# Patient Record
Sex: Female | Born: 1950
Health system: Southern US, Community
[De-identification: ages and names within clinical notes are randomized; demographics above are authoritative.]

## PROBLEM LIST (undated history)

## (undated) DIAGNOSIS — R002 Palpitations: Secondary | ICD-10-CM

## (undated) DIAGNOSIS — I341 Nonrheumatic mitral (valve) prolapse: Secondary | ICD-10-CM

## (undated) DIAGNOSIS — Z87898 Personal history of other specified conditions: Secondary | ICD-10-CM

## (undated) HISTORY — DX: Nonrheumatic mitral (valve) prolapse: I34.1

## (undated) HISTORY — DX: Personal history of other specified conditions: Z87.898

## (undated) HISTORY — PX: OTHER SURGICAL HISTORY: SHX169

## (undated) HISTORY — DX: Palpitations: R00.2

---

## 1995-12-03 HISTORY — PX: CHOLECYSTECTOMY: SHX55

## 1998-03-02 ENCOUNTER — Encounter: Admission: RE | Admit: 1998-03-02 | Discharge: 1998-05-31 | Payer: Self-pay | Admitting: Orthopedic Surgery

## 1998-06-14 ENCOUNTER — Ambulatory Visit: Admission: RE | Admit: 1998-06-14 | Discharge: 1998-06-14 | Payer: Self-pay | Admitting: Family Medicine

## 1998-06-22 ENCOUNTER — Ambulatory Visit (HOSPITAL_BASED_OUTPATIENT_CLINIC_OR_DEPARTMENT_OTHER): Admission: RE | Admit: 1998-06-22 | Discharge: 1998-06-22 | Payer: Self-pay | Admitting: Orthopedic Surgery

## 1999-01-25 ENCOUNTER — Ambulatory Visit (HOSPITAL_COMMUNITY): Admission: RE | Admit: 1999-01-25 | Discharge: 1999-01-25 | Payer: Self-pay | Admitting: Family Medicine

## 1999-01-25 ENCOUNTER — Encounter: Payer: Self-pay | Admitting: Family Medicine

## 1999-06-25 ENCOUNTER — Other Ambulatory Visit: Admission: RE | Admit: 1999-06-25 | Discharge: 1999-06-25 | Payer: Self-pay | Admitting: Family Medicine

## 1999-10-18 ENCOUNTER — Encounter: Admission: RE | Admit: 1999-10-18 | Discharge: 1999-10-18 | Payer: Self-pay | Admitting: Family Medicine

## 1999-10-18 ENCOUNTER — Encounter: Payer: Self-pay | Admitting: Family Medicine

## 2000-01-28 ENCOUNTER — Encounter: Payer: Self-pay | Admitting: Family Medicine

## 2000-01-28 ENCOUNTER — Ambulatory Visit (HOSPITAL_COMMUNITY): Admission: RE | Admit: 2000-01-28 | Discharge: 2000-01-28 | Payer: Self-pay | Admitting: Family Medicine

## 2000-08-28 ENCOUNTER — Encounter: Payer: Self-pay | Admitting: Neurological Surgery

## 2000-08-28 ENCOUNTER — Ambulatory Visit (HOSPITAL_COMMUNITY): Admission: RE | Admit: 2000-08-28 | Discharge: 2000-08-28 | Payer: Self-pay | Admitting: Neurological Surgery

## 2000-09-04 ENCOUNTER — Encounter: Admission: RE | Admit: 2000-09-04 | Discharge: 2000-11-04 | Payer: Self-pay | Admitting: Neurological Surgery

## 2001-01-28 ENCOUNTER — Ambulatory Visit (HOSPITAL_COMMUNITY): Admission: RE | Admit: 2001-01-28 | Discharge: 2001-01-28 | Payer: Self-pay | Admitting: Family Medicine

## 2001-01-28 ENCOUNTER — Encounter: Payer: Self-pay | Admitting: Family Medicine

## 2001-10-05 ENCOUNTER — Encounter: Payer: Self-pay | Admitting: Family Medicine

## 2001-10-05 ENCOUNTER — Encounter: Admission: RE | Admit: 2001-10-05 | Discharge: 2001-10-05 | Payer: Self-pay | Admitting: Family Medicine

## 2001-12-09 ENCOUNTER — Encounter: Admission: RE | Admit: 2001-12-09 | Discharge: 2001-12-09 | Payer: Self-pay | Admitting: Family Medicine

## 2001-12-09 ENCOUNTER — Encounter: Payer: Self-pay | Admitting: Family Medicine

## 2002-03-05 ENCOUNTER — Ambulatory Visit (HOSPITAL_COMMUNITY): Admission: RE | Admit: 2002-03-05 | Discharge: 2002-03-05 | Payer: Self-pay | Admitting: Family Medicine

## 2002-03-05 ENCOUNTER — Encounter: Payer: Self-pay | Admitting: Family Medicine

## 2002-04-01 ENCOUNTER — Other Ambulatory Visit: Admission: RE | Admit: 2002-04-01 | Discharge: 2002-04-01 | Payer: Self-pay | Admitting: Family Medicine

## 2003-03-30 ENCOUNTER — Encounter: Payer: Self-pay | Admitting: Family Medicine

## 2003-03-30 ENCOUNTER — Ambulatory Visit (HOSPITAL_COMMUNITY): Admission: RE | Admit: 2003-03-30 | Discharge: 2003-03-30 | Payer: Self-pay | Admitting: Family Medicine

## 2003-05-04 ENCOUNTER — Other Ambulatory Visit: Admission: RE | Admit: 2003-05-04 | Discharge: 2003-05-04 | Payer: Self-pay | Admitting: Family Medicine

## 2003-11-18 ENCOUNTER — Encounter: Admission: RE | Admit: 2003-11-18 | Discharge: 2003-11-18 | Payer: Self-pay | Admitting: Family Medicine

## 2004-04-30 ENCOUNTER — Ambulatory Visit (HOSPITAL_COMMUNITY): Admission: RE | Admit: 2004-04-30 | Discharge: 2004-04-30 | Payer: Self-pay | Admitting: Family Medicine

## 2004-05-22 ENCOUNTER — Other Ambulatory Visit: Admission: RE | Admit: 2004-05-22 | Discharge: 2004-05-22 | Payer: Self-pay | Admitting: Family Medicine

## 2005-05-03 ENCOUNTER — Ambulatory Visit (HOSPITAL_COMMUNITY): Admission: RE | Admit: 2005-05-03 | Discharge: 2005-05-03 | Payer: Self-pay | Admitting: Family Medicine

## 2005-05-27 ENCOUNTER — Other Ambulatory Visit: Admission: RE | Admit: 2005-05-27 | Discharge: 2005-05-27 | Payer: Self-pay | Admitting: Family Medicine

## 2005-08-03 ENCOUNTER — Emergency Department (HOSPITAL_COMMUNITY): Admission: AD | Admit: 2005-08-03 | Discharge: 2005-08-03 | Payer: Self-pay | Admitting: Family Medicine

## 2006-05-14 ENCOUNTER — Ambulatory Visit (HOSPITAL_COMMUNITY): Admission: RE | Admit: 2006-05-14 | Discharge: 2006-05-14 | Payer: Self-pay | Admitting: General Surgery

## 2006-06-02 ENCOUNTER — Encounter: Admission: RE | Admit: 2006-06-02 | Discharge: 2006-06-02 | Payer: Self-pay | Admitting: General Surgery

## 2006-07-15 ENCOUNTER — Other Ambulatory Visit: Admission: RE | Admit: 2006-07-15 | Discharge: 2006-07-15 | Payer: Self-pay | Admitting: Family Medicine

## 2006-12-16 ENCOUNTER — Ambulatory Visit: Payer: Self-pay | Admitting: Family Medicine

## 2006-12-22 ENCOUNTER — Ambulatory Visit: Payer: Self-pay

## 2006-12-22 ENCOUNTER — Encounter: Payer: Self-pay | Admitting: Cardiology

## 2007-01-08 ENCOUNTER — Ambulatory Visit: Payer: Self-pay | Admitting: Cardiology

## 2007-01-28 ENCOUNTER — Encounter: Admission: RE | Admit: 2007-01-28 | Discharge: 2007-01-28 | Payer: Self-pay | Admitting: Occupational Medicine

## 2007-06-17 ENCOUNTER — Ambulatory Visit (HOSPITAL_COMMUNITY): Admission: RE | Admit: 2007-06-17 | Discharge: 2007-06-17 | Payer: Self-pay | Admitting: Family Medicine

## 2007-09-15 ENCOUNTER — Other Ambulatory Visit: Admission: RE | Admit: 2007-09-15 | Discharge: 2007-09-15 | Payer: Self-pay | Admitting: Family Medicine

## 2007-11-02 HISTORY — PX: COLONOSCOPY: SHX174

## 2007-11-18 ENCOUNTER — Ambulatory Visit (HOSPITAL_COMMUNITY): Admission: RE | Admit: 2007-11-18 | Discharge: 2007-11-18 | Payer: Self-pay | Admitting: Gastroenterology

## 2007-11-18 ENCOUNTER — Encounter (INDEPENDENT_AMBULATORY_CARE_PROVIDER_SITE_OTHER): Payer: Self-pay | Admitting: Gastroenterology

## 2008-04-10 ENCOUNTER — Emergency Department (HOSPITAL_COMMUNITY): Admission: EM | Admit: 2008-04-10 | Discharge: 2008-04-10 | Payer: Self-pay | Admitting: Family Medicine

## 2008-04-13 ENCOUNTER — Encounter: Payer: Self-pay | Admitting: Internal Medicine

## 2008-04-13 ENCOUNTER — Ambulatory Visit: Payer: Self-pay | Admitting: Internal Medicine

## 2008-04-27 ENCOUNTER — Ambulatory Visit: Payer: Self-pay | Admitting: Cardiology

## 2008-04-27 ENCOUNTER — Ambulatory Visit (HOSPITAL_COMMUNITY): Admission: RE | Admit: 2008-04-27 | Discharge: 2008-04-27 | Payer: Self-pay | Admitting: Cardiology

## 2008-07-28 ENCOUNTER — Ambulatory Visit: Payer: Self-pay | Admitting: Cardiology

## 2008-08-01 ENCOUNTER — Ambulatory Visit (HOSPITAL_COMMUNITY): Admission: RE | Admit: 2008-08-01 | Discharge: 2008-08-01 | Payer: Self-pay | Admitting: Family Medicine

## 2009-03-24 ENCOUNTER — Encounter (INDEPENDENT_AMBULATORY_CARE_PROVIDER_SITE_OTHER): Payer: Self-pay | Admitting: *Deleted

## 2009-04-27 ENCOUNTER — Encounter (INDEPENDENT_AMBULATORY_CARE_PROVIDER_SITE_OTHER): Payer: Self-pay | Admitting: *Deleted

## 2009-05-30 DIAGNOSIS — Z9189 Other specified personal risk factors, not elsewhere classified: Secondary | ICD-10-CM | POA: Insufficient documentation

## 2009-05-30 DIAGNOSIS — R002 Palpitations: Secondary | ICD-10-CM | POA: Insufficient documentation

## 2009-05-31 ENCOUNTER — Encounter: Payer: Self-pay | Admitting: Internal Medicine

## 2009-05-31 ENCOUNTER — Ambulatory Visit: Payer: Self-pay | Admitting: Internal Medicine

## 2009-05-31 ENCOUNTER — Other Ambulatory Visit: Admission: RE | Admit: 2009-05-31 | Discharge: 2009-05-31 | Payer: Self-pay | Admitting: Internal Medicine

## 2009-06-02 ENCOUNTER — Ambulatory Visit: Payer: Self-pay | Admitting: Internal Medicine

## 2009-06-06 ENCOUNTER — Encounter (INDEPENDENT_AMBULATORY_CARE_PROVIDER_SITE_OTHER): Payer: Self-pay | Admitting: *Deleted

## 2009-06-06 LAB — CONVERTED CEMR LAB
ALT: 14 units/L (ref 0–35)
AST: 20 units/L (ref 0–37)
Albumin: 4.3 g/dL (ref 3.5–5.2)
Alkaline Phosphatase: 62 units/L (ref 39–117)
Bilirubin Urine: NEGATIVE
Bilirubin, Direct: 0.2 mg/dL (ref 0.0–0.3)
Creatinine, Ser: 0.8 mg/dL (ref 0.4–1.2)
GFR calc non Af Amer: 78.82 mL/min (ref >60)
HDL: 67.2 mg/dL (ref >39.00)
Hemoglobin: 15.1 g/dL — ABNORMAL HIGH (ref 12.0–15.0)
Ketones, ur: NEGATIVE mg/dL
Lymphocytes Relative: 28.2 % (ref 12.0–46.0)
Lymphs Abs: 1.2 10*3/uL (ref 0.7–4.0)
Neutro Abs: 2.5 10*3/uL (ref 1.4–7.7)
Sodium: 142 meq/L (ref 135–145)
TSH: 2.14 microintl units/mL (ref 0.35–5.50)
Total CHOL/HDL Ratio: 3
Total Protein, Urine: NEGATIVE mg/dL
Triglycerides: 54 mg/dL (ref 0.0–149.0)
Urobilinogen, UA: 0.2 (ref 0.0–1.0)
VLDL: 10.8 mg/dL (ref 0.0–40.0)
WBC: 4.3 10*3/uL — ABNORMAL LOW (ref 4.5–10.5)
pH: 7.5 (ref 5.0–8.0)

## 2009-06-07 ENCOUNTER — Encounter (INDEPENDENT_AMBULATORY_CARE_PROVIDER_SITE_OTHER): Payer: Self-pay | Admitting: *Deleted

## 2009-08-03 ENCOUNTER — Ambulatory Visit (HOSPITAL_COMMUNITY): Admission: RE | Admit: 2009-08-03 | Discharge: 2009-08-03 | Payer: Self-pay | Admitting: Internal Medicine

## 2009-10-25 ENCOUNTER — Telehealth: Payer: Self-pay | Admitting: Internal Medicine

## 2009-10-30 ENCOUNTER — Ambulatory Visit: Payer: Self-pay | Admitting: Internal Medicine

## 2010-05-15 ENCOUNTER — Ambulatory Visit: Payer: Self-pay | Admitting: Cardiology

## 2010-08-07 ENCOUNTER — Encounter: Admission: RE | Admit: 2010-08-07 | Discharge: 2010-08-07 | Payer: Self-pay | Admitting: General Surgery

## 2010-10-09 ENCOUNTER — Ambulatory Visit: Payer: Self-pay | Admitting: Internal Medicine

## 2010-10-09 LAB — CONVERTED CEMR LAB
Albumin: 4.3 g/dL (ref 3.5–5.2)
Basophils Absolute: 0 10*3/uL (ref 0.0–0.1)
Basophils Relative: 0.5 % (ref 0.0–3.0)
Bilirubin Urine: NEGATIVE
Bilirubin, Direct: 0.1 mg/dL (ref 0.0–0.3)
Calcium: 9.5 mg/dL (ref 8.4–10.5)
Creatinine, Ser: 0.8 mg/dL (ref 0.4–1.2)
Direct LDL: 118.1 mg/dL
Glucose, Bld: 105 mg/dL — ABNORMAL HIGH (ref 70–99)
HDL: 77.4 mg/dL (ref >39.00)
Hemoglobin: 15.4 g/dL — ABNORMAL HIGH (ref 12.0–15.0)
Ketones, ur: NEGATIVE mg/dL
Lymphocytes Relative: 29.3 % (ref 12.0–46.0)
MCHC: 34.7 g/dL (ref 30.0–36.0)
Platelets: 192 10*3/uL (ref 150.0–400.0)
Specific Gravity, Urine: <=1.005 (ref 1.000–1.030)
Urine Glucose: NEGATIVE mg/dL
Urobilinogen, UA: 0.2 (ref 0.0–1.0)
VLDL: 16.4 mg/dL (ref 0.0–40.0)

## 2010-10-19 ENCOUNTER — Ambulatory Visit: Payer: Self-pay | Admitting: Internal Medicine

## 2010-10-19 ENCOUNTER — Encounter: Payer: Self-pay | Admitting: Internal Medicine

## 2010-10-19 ENCOUNTER — Other Ambulatory Visit: Admission: RE | Admit: 2010-10-19 | Discharge: 2010-10-19 | Payer: Self-pay | Admitting: Internal Medicine

## 2010-12-23 ENCOUNTER — Encounter: Payer: Self-pay | Admitting: General Surgery

## 2010-12-24 ENCOUNTER — Encounter: Payer: Self-pay | Admitting: Internal Medicine

## 2011-01-01 NOTE — Letter (Signed)
Summary: Medical Exam Form/Office of Education [facility]  Medical Exam Form/Office of Education Abroad   Imported By: Sherian Rein 11/01/2010 10:07:13  _____________________________________________________________________  External Attachment:    Type:   Image     Comment:   External Document

## 2011-01-01 NOTE — Assessment & Plan Note (Signed)
Summary: CPX/NWS  #   Vital Signs:  Patient profile:   60 year old female Height:      67 inches (167.64 cm) Weight:      175.4 pounds (79.73 kg) BMI:     27.57 O2 Sat:      97 % on Room air Temp:     98.3 degrees F (36.83 degrees C) oral Pulse rate:   60 / minute BP sitting:   110 / 82  (left arm) Cuff size:   regular  Vitals Entered By: Orlan Leavens RMA (October 19, 2010 10:52 AM)  O2 Flow:  Room air CC: CPX Is Patient Diabetic? No Pain Assessment Patient in pain? no      Comments also have form need fill-out for school   Primary Care Provider:  Newt Lukes MD  CC:  CPX.  History of Present Illness:   patient is here today for annual physical. Patient feels well and has no complaints.  needs completion of international travel abroad form needs PAP - no pelvic pain, postmeopausal bleeding - last period >10 yr ago  Preventive Screening-Counseling & Management  Alcohol-Tobacco     Alcohol drinks/day: <1     Alcohol Counseling: not indicated; use of alcohol is not excessive or problematic     Smoking Status: never     Tobacco Counseling: not indicated; no tobacco use  Caffeine-Diet-Exercise     Does Patient Exercise: yes     Times/week: 4     Exercise Counseling: not indicated; exercise is adequate  Safety-Violence-Falls     Seat Belt Counseling: not indicated; patient wears seat belts     Violence Counseling: not indicated; no violence risk noted     Fall Risk Counseling: not indicated; no significant falls noted  Clinical Review Panels:  Prevention   Last Mammogram:  ASSESSMENT: Negative - BI-RADS 1^MM DIGITAL SCREENING (08/03/2009)   Last Pap Smear:  NEGATIVE FOR INTRAEPITHELIAL LESIONS OR MALIGNANCY. (05/31/2009)   Last Colonoscopy:  normal (11/02/2007)  Immunizations   Last Flu Vaccine:  Historical given @ wrk (10/18/2010)  Lipid Management   Cholesterol:  209 (10/09/2010)   HDL (good cholesterol):  77.40 (10/09/2010)  CBC   WBC:  5.4  (10/09/2010)   RBC:  4.79 (10/09/2010)   Hgb:  15.4 (10/09/2010)   Hct:  44.4 (10/09/2010)   Platelets:  192.0 (10/09/2010)   MCV  92.7 (10/09/2010)   MCHC  34.7 (10/09/2010)   RDW  12.8 (10/09/2010)   PMN:  60.6 (10/09/2010)   Lymphs:  29.3 (10/09/2010)   Monos:  8.3 (10/09/2010)   Eosinophils:  1.3 (10/09/2010)   Basophil:  0.5 (10/09/2010)  Complete Metabolic Panel   Glucose:  105 (10/09/2010)   Sodium:  139 (10/09/2010)   Potassium:  5.3 (10/09/2010)   Chloride:  105 (10/09/2010)   CO2:  28 (10/09/2010)   BUN:  12 (10/09/2010)   Creatinine:  0.8 (10/09/2010)   Albumin:  4.3 (10/09/2010)   Total Protein:  6.9 (10/09/2010)   Calcium:  9.5 (10/09/2010)   Total Bili:  1.2 (10/09/2010)   Alk Phos:  61 (10/09/2010)   SGPT (ALT):  15 (10/09/2010)   SGOT (AST):  21 (10/09/2010)   Current Medications (verified): 1)  Toprol Xl 25 Mg Xr24h-Tab (Metoprolol Succinate) .... Take 1 Tablet By Mouth Once A Day 2)  Aspirin 81 Mg Tbec (Aspirin) .... Take  By Mouth Qd 3)  Calcium 500 Mg Tabs (Calcium Carbonate) .... Take 1 By Mouth Qd  Allergies (verified):  1)  ! Keflex 2)  ! Ampicillin 3)  ! Bactrim 4)  ! Floxin 5)  ! Amoxicillin 6)  ! Demerol  Past History:  Past medical, surgical, family and social histories (including risk factors) reviewed, and no changes noted (except as noted below).  Past Medical History: ?mitral valve prolapse- responded to beta-blockers,    no evidence of this on 2-d echo chest pain hx palpatations  Past Surgical History: Reviewed history from 05/10/2010 and no changes required. Cholecystectomy (1997) Left shoulder acrominoplasty  Family History: Reviewed history from 05/31/2009 and no changes required. Family History Diabetes 1st degree relative (parent) Family History High cholesterol (parent) Family History Hypertension (parent) Multiple myeloma (parent)  Social History: Reviewed history from 05/31/2009 and no changes required. 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 I have reviewed all other systems and they were negative.   Physical Exam  General:  alert, well-developed, well-nourished, and cooperative to examination.    Head:  Normocephalic and atraumatic without obvious abnormalities. No apparent alopecia or balding. Eyes:  vision grossly intact; pupils equal, round and reactive to light.  conjunctiva and lids normal.    Ears:  normal pinnae bilaterally, without erythema, swelling, or tenderness to palpation. TMs clear, without effusion, or cerumen impaction. Hearing grossly normal bilaterally  Mouth:  teeth and gums in good repair; mucous membranes moist, without lesions or ulcers. oropharynx clear without exudate, no erythema.  Neck:  supple, full ROM, no masses, no thyromegaly; no thyroid nodules or tenderness. no JVD or carotid bruits.   Lungs:  normal respiratory effort, no intercostal retractions or use of accessory muscles; normal breath sounds bilaterally - no crackles and no wheezes.    Heart:  normal rate, regular rhythm, no murmur, and no rub. BLE without edema.  Abdomen:  soft, non-tender, normal bowel sounds, no distention; no masses and no appreciable hepatomegaly or splenomegaly.   Rectal:  No external abnormalities noted. Normal sphincter tone. No rectal masses or tenderness. FOB neg Genitalia:  Pelvic Exam:        External: normal female genitalia without lesions or masses        Vagina: normal without lesions or masses        Cervix: normal without lesions or masses        Adnexa: normal bimanual exam without masses or fullness        Uterus: normal by palpation        Pap smear: performed Msk:  No deformity or scoliosis noted of thoracic or lumbar spine.   Neurologic:  alert & oriented X3 and cranial nerves II-XII symetrically intact.  strength normal in all extremities, sensation intact to light  touch, and gait normal. speech fluent without dysarthria or aphasia; follows commands with good comprehension.  Skin:  no rashes, vesicles, ulcers, or erythema. No nodules or irregularity to palpation.  Psych:  Oriented X3, memory intact for recent and remote, normally interactive, good eye contact, not anxious appearing, not depressed appearing, and not agitated.      Impression & Recommendations:  Problem # 1:  PREVENTIVE HEALTH CARE (ICD-V70.0) Patient has been counseled on age-appropriate routine health concerns for screening and prevention. These are reviewed and up-to-date. Immunizations are up-to-date or declined. Labs and ECG reviewed.  Orders: EKG w/ Interpretation (93000) Hemoccult Guaiac-1 spec.(in office) (82270) Pap Smear, Thin Prep ( Collection of) (Z6109)  form for travel abroad completed and returned to pt today  Complete Medication List: 1)  Toprol Xl 25 Mg Xr24h-tab (Metoprolol succinate) .... Take 1 tablet by mouth once a day 2)  Aspirin 81 Mg Tbec (Aspirin) .... Take  by mouth qd 3)  Calcium 500 Mg Tabs (Calcium carbonate) .... Take 1 by mouth qd  Patient Instructions: 1)  it was good to see you today.  2)  exam, labs and EKG all look good 3)  Schedule your mammogram annually, PAP smear here every 2 years (2013 next unless problems). 4)  form completed for master's program trip to [place] - have fun! 5)  Take calcium +Vitamin D daily. 6)  check on tetanus status - need booster every 10years 7)  Return visit every 12 months for your annual physical and labs, sooner if needed   Orders Added: 1)  EKG w/ Interpretation [93000] 2)  Est. Patient 40-64 years [99396] 3)  Hemoccult Guaiac-1 spec.(in office) [82270] 4)  Pap Smear, Thin Prep ( Collection of) [Q0091]   Immunization History:  Influenza Immunization History:    Influenza:  historical given @ wrk (10/18/2010)   Immunization History:  Influenza Immunization History:    Influenza:  Historical given  @ wrk (10/18/2010)

## 2011-01-01 NOTE — Assessment & Plan Note (Signed)
Summary: rov   Visit Type:  rov Primary Provider:  Newt Lukes MD  CC:  chest pain today said because she was called into work Friday PM and said she new she would have some cp....sob as well.....denies any edema.  History of Present Illness: Kim Anderson comes in today for evaluation of recurrent chest pain. 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 This has not worked in the past and does not work now. She felt fine the next day taking a 20 mile bike ride with her children. She had no chest discomfort during this or other exertion. However the next day or 2 she felt tired and had sharp chest pain.  We evaluated this with a stress test in May of 2009. This showed an ejection fraction of 72% with no ischemia.  I reviewed laboratory data with her today from last July including a lipid panel. She is less with an HDL of 67. Her total cholesterol to HDL ratio is 3.  Current Medications (verified): 1)  Toprol Xl 25 Mg Xr24h-Tab (Metoprolol Succinate) .... Take 1 Tablet By Mouth Once A Day 2)  Aspirin 81 Mg Tbec (Aspirin) .... Take  By Mouth Qd 3)  Calcium 500 Mg Tabs (Calcium Carbonate) .... Take 1 By Mouth Qd  Allergies: 1)  ! Keflex 2)  ! Ampicillin 3)  ! Bactrim 4)  ! Floxin 5)  ! Amoxicillin 6)  ! Demerol  Past History:  Past Medical History: Last updated: 05/10/2010 Question of mitral valve prolapse in the past responded to beta-blockers, no evidence of this on 2-d echo chest pain palpatations  Past Surgical History: Last updated: 05/10/2010 Cholecystectomy (1997) Left shoulder acrominoplasty  Family History: Last updated: 05/31/2009 Family History Diabetes 1st degree relative (parent) Family History High cholesterol (parent) Family History Hypertension (parent) Multiple myeloma (parent)  Social History: Last updated: 05/31/2009 Never Smoked lives with spouse, 4 grown Estate agent at [employer]  Risk Factors: Alcohol Use: <1  (05/31/2009) Exercise: yes (05/31/2009)  Risk Factors: Smoking Status: never (05/31/2009)  Review of Systems       negative other than history of present illness  Vital Signs:  Patient profile:   60 year old female Height:      66 inches Weight:      171 pounds BMI:     27.70 Pulse rate:   60 / minute Pulse rhythm:   regular BP sitting:   108 / 62  (left arm) Cuff size:   large  Vitals Entered By: Kim Anderson, CMA (May 15, 2010 2:24 PM)  Physical Exam  General:  Well developed, well nourished, in no acute distress. Head:  normocephalic and atraumatic Eyes:  PERRLA/EOM intact; conjunctiva and lids normal. Neck:  Neck supple, no JVD. No masses, thyromegaly or abnormal cervical nodes. Chest Kim Anderson:  no deformities or breast masses noted Lungs:  Clear bilaterally to auscultation and percussion. Heart:  Non-displaced PMI, chest non-tender; regular rate and rhythm, S1, S2 without murmurs, rubs or gallops. Carotid upstroke normal, no bruit. Normal abdominal aortic size, no bruits. Femorals normal pulses, no bruits. Pedals normal pulses. No edema, no varicosities. Abdomen:  Bowel sounds positive; abdomen soft and non-tender without masses, organomegaly, or hernias noted. No hepatosplenomegaly. Msk:  Back normal, normal gait. Muscle strength and tone normal. Pulses:  pulses normal in all 4 extremities Extremities:  No clubbing or cyanosis. Neurologic:  Alert and oriented x 3. Skin:  Intact without lesions or rashes.  Psych:  Normal affect.   EKG  Procedure date:  05/15/2010  Findings:      normal sinus rhythm, leads aVR and aVL look identical, otherwise unchanged.   Impression & Recommendations:  Problem # 1:  PALPITATIONS (ICD-785.1) Assessment Unchanged  Her updated medication list for this problem includes:    Toprol Xl 25 Mg Xr24h-tab (Metoprolol succinate) .Marland Kitchen... Take 1 tablet by mouth once a day    Aspirin 81 Mg Tbec (Aspirin) .Marland Kitchen... Take  by mouth qd  Orders: EKG  w/ Interpretation (93000)  Problem # 2:  CHEST PAIN, ATYPICAL, HX OF (ICD-V15.89) Assessment: Deteriorated This is clearly not cardiac. His related to stress and working at nights. She has had this pattern off and on for years. Hopefully this will not happen again for sometime.  Problem # 3:  STRESS DISORDER (ICD-V62.89) Assessment: Unchanged  Patient Instructions: 1)  Your physician recommends that you schedule a follow-up appointment in: 1 year with Dr. Daleen Squibb Prescriptions: TOPROL XL 25 MG XR24H-TAB (METOPROLOL SUCCINATE) Take 1 tablet by mouth once a day  #90 x 3   Entered by:   Kim Devoid RN   Authorized by:   Gaylord Shih, MD, [facility]   Signed by:   Kim Devoid RN on 05/15/2010   Method used:   Electronically to        [facility]* (retail)       [address]       [address]       [place], [place]  [address]       Ph: [phone]       Fax: [phone]   RxID:   [id number] TOPROL XL 25 MG XR24H-TAB (METOPROLOL SUCCINATE) Take 1 tablet by mouth once a day  #90 x 3   Entered by:   Kim Anderson, CMA   Authorized by:   Gaylord Shih, MD, [facility]   Signed by:   Kim Anderson, CMA on 05/15/2010   Method used:   Electronically to        [facility]* (retail)       [address]       [address]       [place], [place]  [address]       Ph: [phone]       Fax: [phone]   RxID:   [id number]

## 2011-04-16 NOTE — Assessment & Plan Note (Signed)
[facility]                            CARDIOLOGY OFFICE NOTE   LOZA, PRELL                        MRN:          [id number]  DATE:04/13/2008                            DOB:          11-01-1951    The patient was seen in the [facility] clinic on Apr 13, 2008, for Dr.  Riley Kill; this is a patient of Dr. Juanito Doom.  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 Clarene Duke.  Last year Dr. Daleen Squibb repeated a 2-D  echo that did not reveal any mitral valve prolapse and told her she  could come off her beta-blocker.  She had done fine until recently.  She  is under an extreme amount of stress and has had increased frequency of  chest pain.  She describes this as a pressure, like an elephant sitting  on her chest associated with shortness of breath.  This occurs when she  is under stress or extremely tired.  She has no symptoms when she  exercises 3 days a week either walking or swimming.  She has no  radiation, diaphoresis, nausea or vomiting associated with it.  She  occasionally has palpitations but they her not always associated with  that.  She was called in a prescription for Toprol which she started to  take but did not want to pay for the expense of this so stopped it.  Her  main stress is coming from her daughter who is in an abusive  relationship.   CARDIAC RISK FACTORS:  Are negative for hypertension.  She is not a  diabetic.  No lipid problems.  She is a nonsmoker and does not have a  family history of heart disease.   CURRENT MEDICATIONS:  1. Calcium 500 mg daily.  2. Aspirin 81 mg daily.  3. Fish oil occasionally.   PHYSICAL EXAM:  GENERAL:  This is a pleasant 60 year old white female in  no acute distress.  VITAL SIGNS:  Blood pressure 122/88, pulse 64, weight 173.  NECK:  Neck is without JVD, HJR, bruit or thyroid enlargement.  LUNGS:  Lungs were clear  anterior, posterior and lateral.  HEART:  Regular rate and rhythm at 60 beats per minute, normal S1 and  S2.  There is a 1-2/6 short systolic murmur at the left sternal border  and apex.  No clear click is appreciated.  ABDOMEN:  Soft without organomegaly, masses, lesions or abnormal  tenderness.  EXTREMITIES:  Without clubbing, cyanosis or edema.  She has good distal  pulses.   EKG normal sinus rhythm, normal EKG.   IMPRESSION:  1. Chest pain, increased in frequency.  2. Question of mitral valve prolapse in the past responded to beta-      blockers, no evidence of this on 2-D echo last year.  3. Minimal palpitations.  4. Stress.   PLAN:  I have given a prescription for metoprolol 25 mg daily.  She said  she will take this and can afford the  generic.  I have also scheduled a  stress test mainly to rule out ischemia and give her peace of mind that  she does not have any underlying coronary disease.  She is to schedule  this and then follow up with Dr. Daleen Squibb after.      Jacolyn Reedy, PA-C  Electronically Signed      Arturo Morton. Riley Kill, MD, [facility]  Electronically Signed   ML/MedQ  DD: 04/13/2008  DT: 04/13/2008  Job #: [id number]

## 2011-04-16 NOTE — Assessment & Plan Note (Signed)
[facility]                            CARDIOLOGY OFFICE NOTE   ALA, KRATZ                        MRN:          [id number]  DATE:07/28/2008                            DOB:          03-08-1951    Ms. Lahm returns today for further management of her chest discomfort  and palpitations.   She saw Wende Bushy on Apr 13, 2008, for chest pain.  She had a lot  of stress going on at the time with her domestically.   She had an exercise stress test Myoview, which was negative for ischemia  with great exercise tolerance and an EF of 72%.   She was put back on metoprolol, having been off atenolol for while.  She  is on 25 mg once a day.   Her symptoms are fairly infrequent now.  She says it is when she gets  too tired and when she is under increased stress.   PHYSICAL EXAMINATION:  VITAL SIGNS:  Today her blood pressure is 119/82.  Her pulse 67 and regular.  Weight is 173.  HEENT:  Unchanged.  NECK:  Carotids are equal bilateral without bruits.  No JVD.  Thyroid is  not enlarged.  Trachea is midline.  LUNGS:  Clear.  HEART:  Regular rate and rhythm.  No gallop.  No click.  ABDOMEN:  Soft and good bowel sounds.  No midline bruit.  EXTREMITIES:  No cyanosis, clubbing, or edema.  Pulses are intact.  NEUROLOGIC:  Intact.   ASSESSMENT AND PLAN:  Ms. Crean is doing well.  We made no changes in  the medical program.  I will plan on seeing her back in a year or p.r.n.  She will continue to follow with Dr. Bradd Canary for primary care.     Thomas C. Daleen Squibb, MD, [facility]  Electronically Signed    TCW/MedQ  DD: 07/28/2008  DT: 07/29/2008  Job #: [id number]

## 2011-04-16 NOTE — Op Note (Signed)
NAME:  Kim Anderson, COURTRIGHT NO.:  [id number]   MEDICAL RECORD NO.:  [id number]          PATIENT TYPE:  AMB   LOCATION:  ENDO                         FACILITY:  [facility]   PHYSICIAN:  Petra Kuba, M.D.    DATE OF BIRTH:  [birth year]   DATE OF PROCEDURE:  DATE OF DISCHARGE:                               OPERATIVE REPORT   PROCEDURE:  Colonoscopy.   INDICATION:  Screening.   CONSENT:  Consent was signed after risks, benefits, method, options,  thoroughly discussed by the nurse in the office, and the day prior.   SEDATION/MEDICINES USED:  Phenergan 12.5 mg, fentanyl 100 mcg, Versed 10  mg.   PROCEDURE IN DETAIL:  Rectal inspection is pertinent for small external  hemorrhoids.  The digital examination was negative.  The video pediatric  colonoscope was inserted and easily advanced advanced around the colon  to the cecum.  This did require abdominal pressure, but no positional  changes.  The cecum was identified by the appendiceal orifice and the  ileocecal valve.  The rectoscope was inserted slowly within the terminal  ileum, which was normal.  The scope was slowly withdrawn.  The  preparation was adequate.  There was minimal liquid stool that required  washing and suctioning.  On slow withdrawal through the colon, the  cecum, ascending, transverse, descending, and majority of the sigmoid  were all normal.  In the distal sigmoid and rectum, three tiny  hyperplastic-appearing polyps were seen and were all cold biopsied x1  and put in the same container.  Once back in the rectum on anorectal  pull-through and retroflexion, it confirmed some small hemorrhoids.  The  scope was drained and re-advanced shortly within the left side of the  colon.  Air was suctioned and the scope was removed.  The patient  tolerated the procedure well.  There were no obvious immediate  complications.   ENDOSCOPIC DIAGNOSES:  1. Internal and external small hemorrhoids.  2. Tiny rectal sigmoid  hyperplastic-appearing polyps, status post cold      biopsy.  3. Otherwise within normal limits to the terminal ileum.   PLAN:  Will await pathology, but probably repeat colon screening in five  to ten years.  I will be happy to see her back p.r.n., otherwise, return  to the care of Dr. Artis Flock for the customary health care screening and  maintenance.           ______________________________  Petra Kuba, M.D.     MEM/MEDQ  D:  11/18/2007  T:  11/18/2007  Job:  [id number]   cc:   Quita Skye. Artis Flock, M.D.

## 2011-04-19 NOTE — Assessment & Plan Note (Signed)
[facility]                            CARDIOLOGY OFFICE NOTE   TEAGAN, OZAWA                        MRN:          [id number]  DATE:01/08/2007                            DOB:          10-Dec-1950    Ms. Phang returns today to review the findings of her echo and to see  her response to changing beta blockers for her chest pain.   Remarkably, he echocardiogram shows no evidence of prolapse.  I kind of  suspected this on the front-end.   She rarely has palpitations unless she is extremely fatigued.  She does  however have chest pressure when she is tired or does not sleep well or  she is under stress.   She exercises on a regular basis and has no symptoms of angina or chest  discomfort at those times.   Change in metoprolol extended release 25 mg a day from atenolol has  really not changed things.   Her blood pressure today is 119/80, her pulse is 63 and regular.  Weight  is 167.  The rest of her exam is unchanged.   We spent about 10 minutes talking about the fact she does not have  prolapse, obviously does not need any SBE prophylaxis, and may not even  need a beta blocker.  She would like to try coming off of the beta  blocker which I have recommended.  We did renew a prescription from  metoprolol extended release if she finds that she is worse off of it.  Otherwise, we will see her back on a p.r.n. basis.     Thomas C. Daleen Squibb, MD, [facility]  Electronically Signed    TCW/MedQ  DD: 01/08/2007  DT: 01/08/2007  Job #: [id number]

## 2011-04-19 NOTE — Assessment & Plan Note (Signed)
[facility]                            CARDIOLOGY OFFICE NOTE   Kim Anderson                        MRN:          [id number]  DATE:12/16/2006                            DOB:          10-22-1951    I was asked by Dr. Bradd Canary to evaluate Kim Anderson for the history  of mitral valve prolapse and chest discomfort.   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  She was diagnosed with mitral valve prolapse about 20 years  ago by Dr. Clarene Duke.  This was done with a 2D echocardiogram.   She has been maintained on Atenolol 25 mg a day, taking as much as 37.5  mg when she has a bad day.  Her bad days are when she has worked too  many hours, is stressed, has not slept very much and has this chest  pressure.   This has been more frequent lately and seems to last longer periods of  time.  She has had it for the last three days, in fact.   She denies any problems with palpitations, except on occasion, when she  is tired.  She sits down.  They are very brief.  She denies any  presyncope or syncope.  She has had no exertional symptoms, such as  dyspnea on exertion, exercise intolerance or angina.   She has practiced SBE prophylaxis through the years.  She has multiple  ALLERGIES TO ANTIBIOTICS, INCLUDING KEFLEX, BACTRIM, FLOXIN AND  AMPICILLIN.   Her cardiac risk factors are pertinent only for increasing age.  However, she is still 45.  Her lipids are remarkably good with an HDL of  67, LDL of 109.  She exercises two to three times a week.  Her weight is  up a bit.   PAST MEDICAL HISTORY:  She is allergic to the antibiotics mentioned  above.  She does not smoke.  She drinks occasionally.  Does not use any  recreational drugs.  Has about two to three cups of caffeinated  beverage, either tea or coffee, a day, which does not seem to bother   her.   CURRENT MEDICATIONS:  1. Atenolol 25 mg a day, increased to 37.5 mg a day when she is having      a lot of chest pressure.  2. She is on calcium 500 mg daily.  3. Baby aspirin 81 mg a day.  4. Fish oil supplement one p.o. daily.   She has had a previous laparoscopic cholecystectomy in 1997, left-  shoulder acromioplasty.   SOCIAL HISTORY:  She is married and has four children.  She is a  Designer, jewellery.  She attends [employer].   REVIEW OF SYSTEMS:  Other than the HPI, are totally negative, as  questioned.   EXAM:  She is a very healthy-appearing white female, in no acute  distress.  Her blood pressure today is 121/78, her pulse is 66 and  regular.  She is 5 feet 6 and weighs 169  pounds.  HEENT:  Normocephalic, atraumatic.  PERRLA.  Extraocular movements  intact.  Sclerae clear.  Facial symmetry is normal.  Dentition is  satisfactory.  Her ears reveal a lot of wax in the right ear with  inability to see the tympanic membrane.  Left is clear and pristine.  NECK:  The neck is supple, there is no JVD, thyroid is not enlarged,  trachea is midline.  Carotid upstrokes are equal bilaterally, without  bruits.  LUNGS:  Clear to auscultation and percussion.  There is no rub.  HEART:  Reveals a normal PMI.  There is normal S1, S2.  In the left  lateral decubitus position, I really could not hear a click.  She has  very faint, 1-2 at most, systolic murmur that is short.  It does not  sound like a classic prolapse murmur.  ABDOMINAL EXAM:  Soft, good bowel sounds, no midline bruit.  There is no  hepatomegaly.  EXTREMITIES:  Reveal no cyanosis, clubbing or edema.  Pulses are intact.  NEUROLOGIC EXAM:  Intact.  MUSCULOSKELETAL:  Intact.  SKIN:  Intact.   EKG:  Completely normal.   ASSESSMENT AND PLAN:  1. Probable mitral valve prolapse with chest pressure.  The chest      pressure seems to be more frequent and more severe and last longer      recently.  This may be just  stress-related.  She points this out,      as I have noted in the HPI.  2. Minimal palpitations.  3. SBE prophylaxis.   PLAN:  1. 2D echocardiogram to definitely confirm the diagnosis of mitral      valve prolapse with newer additional technology.  2. Change atenolol to metoprolol extended release 25 mg a day with an      extra 25 on a bad day.  3. Discontinue SBE prophylaxis.   I will schedule her for followup in four weeks to see how she is doing  on a different beta blocker.  Hopefully this will help.     Thomas C. Daleen Squibb, MD, [facility]  Electronically Signed    TCW/MedQ  DD: 12/16/2006  DT: 12/17/2006  Job #: [id number]   cc:   Quita Skye. Artis Flock, M.D.

## 2011-06-03 ENCOUNTER — Telehealth: Payer: Self-pay | Admitting: Cardiology

## 2011-06-03 MED ORDER — METOPROLOL SUCCINATE ER 25 MG PO TB24: 25.0000 mg | ORAL_TABLET | Freq: Every day | ORAL | Status: DC

## 2011-06-03 NOTE — Telephone Encounter (Signed)
Pt calling needing one month RX for metoprolol 25 mg daily chest pain. [facility]

## 2011-07-02 ENCOUNTER — Encounter: Payer: Self-pay | Admitting: Cardiology

## 2011-07-09 ENCOUNTER — Ambulatory Visit: Payer: Self-pay | Admitting: Cardiology

## 2011-07-09 ENCOUNTER — Other Ambulatory Visit (INDEPENDENT_AMBULATORY_CARE_PROVIDER_SITE_OTHER): Payer: Self-pay | Admitting: General Surgery

## 2011-07-09 DIAGNOSIS — Z1231 Encounter for screening mammogram for malignant neoplasm of breast: Secondary | ICD-10-CM

## 2011-08-14 ENCOUNTER — Ambulatory Visit
Admission: RE | Admit: 2011-08-14 | Discharge: 2011-08-14 | Disposition: A | Discharge status: Home / Self Care | Payer: Commercial Managed Care - PPO | Source: Ambulatory Visit | Attending: General Surgery | Admitting: General Surgery

## 2011-08-14 ENCOUNTER — Ambulatory Visit: Payer: Self-pay | Admitting: Cardiology

## 2011-08-14 DIAGNOSIS — Z1231 Encounter for screening mammogram for malignant neoplasm of breast: Secondary | ICD-10-CM

## 2011-08-15 ENCOUNTER — Other Ambulatory Visit (INDEPENDENT_AMBULATORY_CARE_PROVIDER_SITE_OTHER): Payer: Self-pay | Admitting: General Surgery

## 2011-08-15 DIAGNOSIS — N644 Mastodynia: Secondary | ICD-10-CM

## 2011-08-21 ENCOUNTER — Ambulatory Visit
Admission: RE | Admit: 2011-08-21 | Discharge: 2011-08-21 | Disposition: A | Discharge status: Home / Self Care | Payer: Commercial Managed Care - PPO | Source: Ambulatory Visit | Attending: General Surgery | Admitting: General Surgery

## 2011-08-21 ENCOUNTER — Other Ambulatory Visit (INDEPENDENT_AMBULATORY_CARE_PROVIDER_SITE_OTHER): Payer: Self-pay | Admitting: General Surgery

## 2011-08-21 DIAGNOSIS — N644 Mastodynia: Secondary | ICD-10-CM

## 2011-08-23 ENCOUNTER — Ambulatory Visit (INDEPENDENT_AMBULATORY_CARE_PROVIDER_SITE_OTHER): Payer: Commercial Managed Care - PPO | Admitting: Cardiology

## 2011-08-23 ENCOUNTER — Encounter: Payer: Self-pay | Admitting: Cardiology

## 2011-08-23 VITALS — BP 116/82 | HR 64 | Ht 66.0 in | Wt 180.8 lb

## 2011-08-23 DIAGNOSIS — R002 Palpitations: Secondary | ICD-10-CM

## 2011-08-23 DIAGNOSIS — Z9189 Other specified personal risk factors, not elsewhere classified: Secondary | ICD-10-CM

## 2011-08-23 NOTE — Assessment & Plan Note (Signed)
Improved. No change in medications. Avoid excess fatigue and stress if possible.

## 2011-08-23 NOTE — Patient Instructions (Signed)
Your physician recommends that you schedule a follow-up appointment in:  As needed with Dr. Wall  

## 2011-08-23 NOTE — Progress Notes (Signed)
HPI Kim Anderson returns today for evaluation and management of her palpitations. Responded well to low-dose beta blockers.  As long as she's not too fatigued and stress, she has very few symptoms. He walks 4-6 miles 3 or 4 times a week and has no complaints.  Meds reviewed and she is compliant. EKG shows normal sinus rhythm, normal EKG Past Medical History  Diagnosis Date  . Mitral valve prolapse     responded to beta-blockers. no evidence of this on 2-d echo  . History of chest pain   . Palpitations     Past Surgical History  Procedure Date  . Cholecystectomy 1997  . Left shoulder acrominoplasty   . Colonoscopy 11/02/2007    normal    Family History  Problem Relation Age of Onset  . Diabetes      parent  . Hypertension      parent    History   Social History  . Marital Status: Married    Spouse Name: N/A    Number of Children: 4  . Years of Education: N/A   Occupational History  . 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 Smoking status: Never Smoker   . Smokeless tobacco: Not on file  . Alcohol Use: No  . Drug Use: Not on file  . Sexually Active: Not on file   Other Topics Concern  . Not on file   Social History Narrative   Lives with spouse    Allergies  Allergen Reactions  . Amoxicillin     REACTION: Rash  . Ampicillin     REACTION: Rash  . Cephalexin     REACTION: Itching/colitis  . Meperidine Hcl     REACTION: Nausea \\T \ vomitting  . Ofloxacin     REACTION: Insomnia  . Sulfamethoxazole W/Trimethoprim     REACTION: Hives    Current Outpatient Prescriptions  Medication Sig Dispense Refill  . aspirin 81 MG EC tablet Take 81 mg by mouth daily.        . calcium carbonate (TUMS - DOSED IN MG ELEMENTAL CALCIUM) 500 MG chewable tablet Chew 1 tablet by mouth daily.        . fish oil-omega-3 fatty acids 1000 MG capsule Take 2 g by mouth daily.        . metoprolol succinate (TOPROL-XL) 25 MG 24 hr tablet Take 1 tablet (25  mg total) by mouth daily.  30 tablet  12    ROS Negative other than HPI.   PE General Appearance: well developed, well nourished in no acute distress HEENT: symmetrical face, PERRLA, good dentition  Neck: no JVD, thyromegaly, or adenopathy, trachea midline Chest: symmetric without deformity Cardiac: PMI non-displaced, RRR, normal S1, S2, no gallop or murmur Lung: clear to ausculation and percussion Vascular: all pulses full without bruits  Abdominal: nondistended, nontender, good bowel sounds, no HSM, no bruits Extremities: no cyanosis, clubbing or edema, no sign of DVT, no varicosities  Skin: normal color, no rashes Neuro: alert and oriented x 3, non-focal Pysch: normal affect Filed Vitals:   08/23/11 1608  BP: 116/82  Pulse: 64  Height: 5\' 6"  (1.676 m)  Weight: 180 lb 12.8 oz (82.01 kg)    EKG  Labs and Studies Reviewed.   Lab Results  Component Value Date   WBC 5.4 10/09/2010   HGB 15.4* 10/09/2010   HCT 44.4 10/09/2010   MCV 92.7 10/09/2010   PLT 192.0 10/09/2010  Chemistry      Component Value Date/Time   NA 139 10/09/2010 1049   K 5.3* 10/09/2010 1049   CL 105 10/09/2010 1049   CO2 28 10/09/2010 1049   BUN 12 10/09/2010 1049   CREATININE 0.8 10/09/2010 1049      Component Value Date/Time   CALCIUM 9.5 10/09/2010 1049   ALKPHOS 61 10/09/2010 1049   AST 21 10/09/2010 1049   ALT 15 10/09/2010 1049   BILITOT 1.2 10/09/2010 1049       Lab Results  Component Value Date   CHOL 209* 10/09/2010   CHOL 211* 06/02/2009   Lab Results  Component Value Date   HDL 77.40 10/09/2010   HDL 40.98 06/02/2009   No results found for this basename: [facility]   Lab Results  Component Value Date   TRIG 82.0 10/09/2010   TRIG 54.0 06/02/2009   Lab Results  Component Value Date   CHOLHDL 3 10/09/2010   CHOLHDL 3 06/02/2009   No results found for this basename: HGBA1C   Lab Results  Component Value Date   ALT 15 10/09/2010   AST 21 10/09/2010   ALKPHOS 61 10/09/2010   BILITOT  1.2 10/09/2010   Lab Results  Component Value Date   TSH 2.02 10/09/2010  in an

## 2012-06-16 ENCOUNTER — Telehealth: Payer: Self-pay | Admitting: *Deleted

## 2012-06-16 DIAGNOSIS — Z0000 Encounter for general adult medical examination without abnormal findings: Secondary | ICD-10-CM

## 2012-06-16 NOTE — Telephone Encounter (Signed)
Message copied by Deatra James on Tue Jun 16, 2012  2:47 PM ------      Message from: Etheleen Sia      Created: Tue Jun 16, 2012  9:44 AM      Regarding: PHY LABS       CPX LABS FOR AUG 2 PHYSICAL

## 2012-06-16 NOTE — Telephone Encounter (Signed)
Received staff msg pt made cpx for 07/03/12 needing labs enter in epic... 06/16/12@2 :47pm/LMB

## 2012-06-29 ENCOUNTER — Other Ambulatory Visit (INDEPENDENT_AMBULATORY_CARE_PROVIDER_SITE_OTHER): Payer: Commercial Managed Care - PPO

## 2012-06-29 DIAGNOSIS — Z0000 Encounter for general adult medical examination without abnormal findings: Secondary | ICD-10-CM

## 2012-06-29 LAB — URINALYSIS, ROUTINE W REFLEX MICROSCOPIC
Bilirubin Urine: NEGATIVE
Nitrite: NEGATIVE
Total Protein, Urine: NEGATIVE
Urine Glucose: NEGATIVE
pH: 6.5 (ref 5.0–8.0)

## 2012-06-29 LAB — CBC WITH DIFFERENTIAL/PLATELET
Eosinophils Relative: 0.6 % (ref 0.0–5.0)
Lymphocytes Relative: 20.1 % (ref 12.0–46.0)
Monocytes Absolute: 0.4 10*3/uL (ref 0.1–1.0)
Monocytes Relative: 5.1 % (ref 3.0–12.0)
Neutrophils Relative %: 73.8 % (ref 43.0–77.0)
Platelets: 184 10*3/uL (ref 150.0–400.0)
RBC: 4.73 Mil/uL (ref 3.87–5.11)
WBC: 7.1 10*3/uL (ref 4.5–10.5)

## 2012-06-29 LAB — BASIC METABOLIC PANEL
BUN: 15 mg/dL (ref 6–23)
Calcium: 9.4 mg/dL (ref 8.4–10.5)
Creatinine, Ser: 0.8 mg/dL (ref 0.4–1.2)
GFR: 82.73 mL/min (ref >60.00)

## 2012-06-29 LAB — HEPATIC FUNCTION PANEL
ALT: 44 U/L — ABNORMAL HIGH (ref 0–35)
Alkaline Phosphatase: 56 U/L (ref 39–117)
Bilirubin, Direct: 0.2 mg/dL (ref 0.0–0.3)
Total Bilirubin: 1.2 mg/dL (ref 0.3–1.2)

## 2012-06-29 LAB — LIPID PANEL
Cholesterol: 187 mg/dL (ref 0–200)
LDL Cholesterol: 105 mg/dL — ABNORMAL HIGH (ref 0–99)
Total CHOL/HDL Ratio: 3
Triglycerides: 56 mg/dL (ref 0.0–149.0)
VLDL: 11.2 mg/dL (ref 0.0–40.0)

## 2012-07-03 ENCOUNTER — Encounter: Payer: Self-pay | Admitting: Internal Medicine

## 2012-07-03 ENCOUNTER — Ambulatory Visit (INDEPENDENT_AMBULATORY_CARE_PROVIDER_SITE_OTHER): Payer: Commercial Managed Care - PPO | Admitting: Internal Medicine

## 2012-07-03 ENCOUNTER — Other Ambulatory Visit (INDEPENDENT_AMBULATORY_CARE_PROVIDER_SITE_OTHER): Payer: Commercial Managed Care - PPO

## 2012-07-03 VITALS — BP 120/80 | HR 62 | Temp 98.1°F | Ht 66.0 in | Wt 177.5 lb

## 2012-07-03 DIAGNOSIS — R7309 Other abnormal glucose: Secondary | ICD-10-CM

## 2012-07-03 DIAGNOSIS — R7989 Other specified abnormal findings of blood chemistry: Secondary | ICD-10-CM | POA: Insufficient documentation

## 2012-07-03 DIAGNOSIS — Z0000 Encounter for general adult medical examination without abnormal findings: Secondary | ICD-10-CM

## 2012-07-03 DIAGNOSIS — R002 Palpitations: Secondary | ICD-10-CM

## 2012-07-03 DIAGNOSIS — R945 Abnormal results of liver function studies: Secondary | ICD-10-CM

## 2012-07-03 DIAGNOSIS — R739 Hyperglycemia, unspecified: Secondary | ICD-10-CM | POA: Insufficient documentation

## 2012-07-03 DIAGNOSIS — G5760 Lesion of plantar nerve, unspecified lower limb: Secondary | ICD-10-CM

## 2012-07-03 LAB — HEPATIC FUNCTION PANEL
AST: 30 U/L (ref 0–37)
Alkaline Phosphatase: 60 U/L (ref 39–117)
Bilirubin, Direct: 0.2 mg/dL (ref 0.0–0.3)
Total Protein: 7.9 g/dL (ref 6.0–8.3)

## 2012-07-03 LAB — HEPATITIS B SURFACE ANTIGEN: Hepatitis B Surface Ag: NEGATIVE

## 2012-07-03 LAB — HEPATITIS B SURFACE ANTIBODY,QUALITATIVE: Hep B S Ab: POSITIVE — AB

## 2012-07-03 MED ORDER — METOPROLOL SUCCINATE ER 25 MG PO TB24: 25.0000 mg | ORAL_TABLET | Freq: Every day | ORAL | Status: DC

## 2012-07-03 NOTE — Assessment & Plan Note (Signed)
S/p remote chole and no GI/systemic symptoms - exam benign No offending meds by hx Recheck LFTs now with HepB/C If still abnormal with no infection,plan ultrasound eval

## 2012-07-03 NOTE — Assessment & Plan Note (Signed)
realted to stress and sleep deprivation Well controlled at present on beta-blocker -  continue present plan and medications.  follow up cards prn planned

## 2012-07-03 NOTE — Patient Instructions (Addendum)
It was good to see you today. We have reviewed your interval history and prior records including labs and tests today Health Maintenance reviewed - all recommended immunizations and age-appropriate screenings are up-to-date. Treatment of your neuroma foot pain as discussed - if unimproved, call for refer to Dr Evelina Dun group as needed Test(s) ordered today to evaluate liver tests and sugar. Your results will be called to you after review (48-72hours after test completion). If any changes need to be made, you will be notified at that time. Medications reviewed, no changes at this time. Refill on medication(s) as discussed today. Please schedule followup in 1 year, call sooner if problems.   Health Maintenance, Females A healthy lifestyle and preventative care can promote health and wellness.  Maintain regular health, dental, and eye exams.   Eat a healthy diet. Foods like vegetables, fruits, whole grains, low-fat dairy products, and lean protein foods contain the nutrients you need without too many calories. Decrease your intake of foods high in solid fats, added sugars, and salt. Get information about a proper diet from your caregiver, if necessary.   Regular physical exercise is one of the most important things you can do for your health. Most adults should get at least 150 minutes of moderate-intensity exercise (any activity that increases your heart rate and causes you to sweat) each week. In addition, most adults need muscle-strengthening exercises on 2 or more days a week.     Maintain a healthy weight. The body mass index (BMI) is a screening tool to identify possible weight problems. It provides an estimate of body fat based on height and weight. Your caregiver can help determine your BMI, and can help you achieve or maintain a healthy weight. For adults 20 years and older:   A BMI below 18.5 is considered underweight.   A BMI of 18.5 to 24.9 is normal.   A BMI of 25 to 29.9 is  considered overweight.   A BMI of 30 and above is considered obese.   Maintain normal blood lipids and cholesterol by exercising and minimizing your intake of saturated fat. Eat a balanced diet with plenty of fruits and vegetables. Blood tests for lipids and cholesterol should begin at age 22 and be repeated every 5 years. If your lipid or cholesterol levels are high, you are over 50, or you are a high risk for heart disease, you may need your cholesterol levels checked more frequently. Ongoing high lipid and cholesterol levels should be treated with medicines if diet and exercise are not effective.   If you smoke, find out from your caregiver how to quit. If you do not use tobacco, do not start.   If you are pregnant, do not drink alcohol. If you are breastfeeding, be very cautious about drinking alcohol. If you are not pregnant and choose to drink alcohol, do not exceed 1 drink per day. One drink is considered to be 12 ounces (355 mL) of beer, 5 ounces (148 mL) of wine, or 1.5 ounces (44 mL) of liquor.   Avoid use of street drugs. Do not share needles with anyone. Ask for help if you need support or instructions about stopping the use of drugs.   High blood pressure causes heart disease and increases the risk of stroke. Blood pressure should be checked at least every 1 to 2 years. Ongoing high blood pressure should be treated with medicines, if weight loss and exercise are not effective.   If you are 55 to 59 years  old, ask your caregiver if you should take aspirin to prevent strokes.   Diabetes screening involves taking a blood sample to check your fasting blood sugar level. This should be done once every 3 years, after age 62, if you are within normal weight and without risk factors for diabetes. Testing should be considered at a younger age or be carried out more frequently if you are overweight and have at least 1 risk factor for diabetes.   Breast cancer screening is essential preventative  care for women. You should practice "breast self-awareness." This means understanding the normal appearance and feel of your breasts and may include breast self-examination. Any changes detected, no matter how small, should be reported to a caregiver. Women in their 64s and 30s should have a clinical breast exam (CBE) by a caregiver as part of a regular health exam every 1 to 3 years. After age 69, women should have a CBE every year. Starting at age 22, women should consider having a mammogram (breast X-ray) every year. Women who have a family history of breast cancer should talk to their caregiver about genetic screening. Women at a high risk of breast cancer should talk to their caregiver about having an MRI and a mammogram every year.   The Pap test is a screening test for cervical cancer. Women should have a Pap test starting at age 72. Between ages 35 and 34, Pap tests should be repeated every 2 years. Beginning at age 70, you should have a Pap test every 3 years as long as the past 3 Pap tests have been normal. If you had a hysterectomy for a problem that was not cancer or a condition that could lead to cancer, then you no longer need Pap tests. If you are between ages 1 and 79, and you have had normal Pap tests going back 10 years, you no longer need Pap tests. If you have had past treatment for cervical cancer or a condition that could lead to cancer, you need Pap tests and screening for cancer for at least 20 years after your treatment. If Pap tests have been discontinued, risk factors (such as a new sexual partner) need to be reassessed to determine if screening should be resumed. Some women have medical problems that increase the chance of getting cervical cancer. In these cases, your caregiver may recommend more frequent screening and Pap tests.   The human papillomavirus (HPV) test is an additional test that may be used for cervical cancer screening. The HPV test looks for the virus that can cause  the cell changes on the cervix. The cells collected during the Pap test can be tested for HPV. The HPV test could be used to screen women aged 47 years and older, and should be used in women of any age who have unclear Pap test results. After the age of 40, women should have HPV testing at the same frequency as a Pap test.   Colorectal cancer can be detected and often prevented. Most routine colorectal cancer screening begins at the age of 44 and continues through age 73. However, your caregiver may recommend screening at an earlier age if you have risk factors for colon cancer. On a yearly basis, your caregiver may provide home test kits to check for hidden blood in the stool. Use of a small camera at the end of a tube, to directly examine the colon (sigmoidoscopy or colonoscopy), can detect the earliest forms of colorectal cancer. Talk to your caregiver about  this at age 2, when routine screening begins. Direct examination of the colon should be repeated every 5 to 10 years through age 46, unless early forms of pre-cancerous polyps or small growths are found.   Hepatitis C blood testing is recommended for all people born from [birth year] through 1963 and any individual with known risks for hepatitis C.   Practice safe sex. Use condoms and avoid high-risk sexual practices to reduce the spread of sexually transmitted infections (STIs). Sexually active women aged 14 and younger should be checked for Chlamydia, which is a common sexually transmitted infection. Older women with new or multiple partners should also be tested for Chlamydia. Testing for other STIs is recommended if you are sexually active and at increased risk.   Osteoporosis is a disease in which the bones lose minerals and strength with aging. This can result in serious bone fractures. The risk of osteoporosis can be identified using a bone density scan. Women ages 1 and over and women at risk for fractures or osteoporosis should discuss screening  with their caregivers. Ask your caregiver whether you should be taking a calcium supplement or vitamin D to reduce the rate of osteoporosis.   Menopause can be associated with physical symptoms and risks. Hormone replacement therapy is available to decrease symptoms and risks. You should talk to your caregiver about whether hormone replacement therapy is right for you.   Use sunscreen with a sun protection factor (SPF) of 30 or greater. Apply sunscreen liberally and repeatedly throughout the day. You should seek shade when your shadow is shorter than you. Protect yourself by wearing long sleeves, pants, a wide-brimmed hat, and sunglasses year round, whenever you are outdoors.   Notify your caregiver of new moles or changes in moles, especially if there is a change in shape or color. Also notify your caregiver if a mole is larger than the size of a pencil eraser.   Stay current with your immunizations.  Document Released: 06/03/2011 Document Revised: 11/07/2011 Document Reviewed: 06/03/2011 [facility] Patient Information 2012 [place], [place].Morton's Neuroma Neuralgia (nerve pain) or neuroma (benign [non-cancerous] nerve tumor) may develop on any interdigital nerve. The interdigital nerves (nerves between digits) of the foot travel beneath and between the metatarsals (long bones of the fore foot) and pass the nerve endings to the toes. The third interdigital is a common place for a small neuroma to form called Morton's neuroma. Another nerve to be affected commonly is the fourth interdigital nerve. This would be in approximately in the area of the base or ball under the bottom of your fourth toe. This condition occurs more commonly in women and is usually on one side. It is usually first noticed by pain radiating (spreading) to the ball of the foot or to the toes. CAUSES The cause of interdigital neuralgia may be from low grade repetitive trauma (damage caused by an accident) as in activities causing a  repeated pounding of the foot (running, jumping etc.). It is also caused by improper footwear or recent loss of the fatty padding on the bottom of the foot. TREATMENT   The condition often resolves (goes away) simply with decreasing activity if that is thought to be the cause. Proper shoes are beneficial. Orthotics (special foot support aids) such as a metatarsal bar are often beneficial. This condition usually responds to conservative therapy, however if surgery is necessary it usually brings complete relief. HOME CARE INSTRUCTIONS    Apply ice to the area of soreness for 15 to 20 minutes,  3 to 4 times per day, while awake for the first 2 days. Put ice in a plastic bag and place a towel between the bag of ice and your skin.   Only take over-the-counter or prescription medicines for pain, discomfort, or fever as directed by your caregiver.  MAKE SURE YOU:    Understand these instructions.   Will watch your condition.   Will get help right away if you are not doing well or get worse.  Document Released: 02/24/2001 Document Revised: 11/07/2011 Document Reviewed: 11/18/2005 [facility] Patient Information 2012 [place], [place].

## 2012-07-03 NOTE — Progress Notes (Signed)
Subjective:    Patient ID: Kim Anderson, female    DOB: 1951-01-08, 61 y.o.   MRN: [id number]  HPI  patient is here today for annual physical. Patient feels well and has no complaints.  Also complains of R foot pain - located in ball of foot between 2nd and great toe Onset 1 mo ago Precipitated by increase aerobic activity - no foreign body exposure or injury recalled Causes "burning" pain into 2nd toe -worst at end of day  Past Medical History  Diagnosis Date  . Mitral valve prolapse     responded to beta-blockers. no evidence of this on 2-d echo  . History of chest pain   . Palpitations    Family History  Problem Relation Age of Onset  . Diabetes      parent  . Hypertension      parent   History  Substance Use Topics  . Smoking status: Never Smoker   . Smokeless tobacco: Not on file  . Alcohol Use: No    Review of Systems Constitutional: Negative for fever or weight change.  Respiratory: Negative for cough and shortness of breath.   Cardiovascular: Negative for chest pain or palpitations.  Gastrointestinal: Negative for abdominal pain, no bowel changes.  Musculoskeletal: Negative for gait problem or joint swelling.  Skin: Negative for rash.  Neurological: Negative for dizziness or headache.  No other specific complaints in a complete review of systems (except as listed in HPI above).     Objective:   Physical Exam BP 120/80  Pulse 62  Temp 98.1 F (36.7 C) (Oral)  Ht 5\' 6"  (1.676 m)  Wt 177 lb 8 oz (80.513 kg)  BMI 28.65 kg/m2  SpO2 97% Wt Readings from Last 3 Encounters:  07/03/12 177 lb 8 oz (80.513 kg)  08/23/11 180 lb 12.8 oz (82.01 kg)  10/19/10 175 lb 6.4 oz (79.561 kg)   Constitutional: She appears well-developed and well-nourished. No distress.  HENT: Head: Normocephalic and atraumatic. Ears: B TMs ok, no erythema or effusion; Nose: Nose normal. Mouth/Throat: Oropharynx is clear and moist. No oropharyngeal exudate.  Eyes: Conjunctivae and EOM  are normal. Pupils are equal, round, and reactive to light. No scleral icterus.  Neck: Normal range of motion. Neck supple. No JVD present. No thyromegaly present.  Cardiovascular: Normal rate, regular rhythm and normal heart sounds.  No murmur heard. No BLE edema. Pulmonary/Chest: Effort normal and breath sounds normal. No respiratory distress. She has no wheezes.  Abdominal: Soft. Bowel sounds are normal. She exhibits no distension. There is no tenderness. no masses Musculoskeletal: pain with palpation R webspace 1/2 toes. Normal range of motion, no joint effusions. No gross deformities Neurological: She is alert and oriented to person, place, and time. No cranial nerve deficit. Coordination normal.  Skin: Skin is warm and dry. No rash noted. No erythema.  Psychiatric: She has a normal mood and affect. Her behavior is normal. Judgment and thought content normal.   Lab Results  Component Value Date   WBC 7.1 06/29/2012   HGB 14.8 06/29/2012   HCT 43.8 06/29/2012   PLT 184.0 06/29/2012   GLUCOSE 104* 06/29/2012   CHOL 187 06/29/2012   TRIG 56.0 06/29/2012   HDL 70.40 06/29/2012   LDLDIRECT 118.1 10/09/2010   LDLCALC 105* 06/29/2012   ALT 44* 06/29/2012   AST 82* 06/29/2012   NA 136 06/29/2012   K 5.0 06/29/2012   CL 105 06/29/2012   CREATININE 0.8 06/29/2012   BUN 15 06/29/2012  CO2 26 06/29/2012   TSH 2.16 06/29/2012        Assessment & Plan:  CPX/v70.0 - Patient has been counseled on age-appropriate routine health concerns for screening and prevention. These are reviewed and up-to-date. Immunizations are up-to-date or declined. Labs reviewed.  R foot pain - 2nd toe "burning" - no foreign body hx exposure - conservative care for suspect neuroma - education provided - pad support and call if refer to sports med is needed

## 2012-07-03 NOTE — Assessment & Plan Note (Signed)
Fasting, mild hyperglycemia - FH DM - check a1c The patient is asked to make an attempt to improve diet and exercise patterns to aid in medical management of this problem.

## 2012-08-13 ENCOUNTER — Other Ambulatory Visit (INDEPENDENT_AMBULATORY_CARE_PROVIDER_SITE_OTHER): Payer: Self-pay | Admitting: General Surgery

## 2012-08-13 DIAGNOSIS — Z1231 Encounter for screening mammogram for malignant neoplasm of breast: Secondary | ICD-10-CM

## 2012-09-02 ENCOUNTER — Ambulatory Visit
Admission: RE | Admit: 2012-09-02 | Discharge: 2012-09-02 | Disposition: A | Discharge status: Home / Self Care | Payer: 59 | Source: Ambulatory Visit | Attending: General Surgery | Admitting: General Surgery

## 2012-09-02 ENCOUNTER — Telehealth (INDEPENDENT_AMBULATORY_CARE_PROVIDER_SITE_OTHER): Payer: Self-pay | Admitting: General Surgery

## 2012-09-02 DIAGNOSIS — Z1231 Encounter for screening mammogram for malignant neoplasm of breast: Secondary | ICD-10-CM

## 2012-09-02 NOTE — Telephone Encounter (Signed)
Spoke with pt and informed her that we received her MGM and that it looked negative.

## 2012-09-02 NOTE — Telephone Encounter (Signed)
Message copied by Littie Deeds on Wed Sep 02, 2012  5:10 PM ------      Message from: Caleen Essex III      Created: Wed Sep 02, 2012  3:58 PM       Looks neg

## 2012-12-04 ENCOUNTER — Ambulatory Visit: Payer: 59 | Admitting: Internal Medicine

## 2012-12-07 ENCOUNTER — Encounter: Payer: Self-pay | Admitting: Internal Medicine

## 2012-12-07 ENCOUNTER — Other Ambulatory Visit (INDEPENDENT_AMBULATORY_CARE_PROVIDER_SITE_OTHER): Payer: 59

## 2012-12-07 ENCOUNTER — Ambulatory Visit (INDEPENDENT_AMBULATORY_CARE_PROVIDER_SITE_OTHER): Payer: 59 | Admitting: Internal Medicine

## 2012-12-07 VITALS — BP 110/80 | HR 69 | Temp 98.2°F | Ht 66.0 in | Wt 177.8 lb

## 2012-12-07 DIAGNOSIS — R198 Other specified symptoms and signs involving the digestive system and abdomen: Secondary | ICD-10-CM

## 2012-12-07 LAB — CBC WITH DIFFERENTIAL/PLATELET
Basophils Absolute: 0 10*3/uL (ref 0.0–0.1)
Eosinophils Absolute: 0.1 10*3/uL (ref 0.0–0.7)
Hemoglobin: 14.7 g/dL (ref 12.0–15.0)
Lymphocytes Relative: 34.3 % (ref 12.0–46.0)
MCHC: 34.5 g/dL (ref 30.0–36.0)
Monocytes Relative: 8.7 % (ref 3.0–12.0)
Neutrophils Relative %: 54.9 % (ref 43.0–77.0)
Platelets: 261 10*3/uL (ref 150.0–400.0)
RDW: 12.5 % (ref 11.5–14.6)

## 2012-12-07 LAB — HEPATIC FUNCTION PANEL
AST: 19 U/L (ref 0–37)
Albumin: 4.4 g/dL (ref 3.5–5.2)
Alkaline Phosphatase: 61 U/L (ref 39–117)
Bilirubin, Direct: 0.2 mg/dL (ref 0.0–0.3)
Total Bilirubin: 1.2 mg/dL (ref 0.3–1.2)

## 2012-12-07 LAB — LIPASE: Lipase: 24 U/L (ref 11.0–59.0)

## 2012-12-07 NOTE — Patient Instructions (Signed)
It was good to see you today. We have reviewed your prior records including labs and tests today Test(s) ordered today. Your results will be released to MyChart (or called to you) after review, usually within 72hours after test completion. If any changes need to be made, you will be notified at that same time. Further testing will depend on test results and recurrence of bowel color change

## 2012-12-07 NOTE — Progress Notes (Signed)
  Subjective:    Patient ID: Kim Anderson, female    DOB: 09/06/1951, 62 y.o.   MRN: [id number]  HPI  Here for change bowel movement - solid white BM One episode, subsequent BMs light in color but not white No pain, no nausea and vomiting or other bowel changes  Past Medical History  Diagnosis Date  . Mitral valve prolapse     responded to beta-blockers. no evidence of this on 2-d echo  . History of chest pain   . Palpitations     Review of Systems  Respiratory: Negative for cough and shortness of breath.   Cardiovascular: Negative for chest pain or palpitations.  Gastrointestinal: Negative for abdominal pain     Objective:   Physical Exam  BP 110/80  Pulse 69  Temp 98.2 F (36.8 C) (Oral)  Ht 5\' 6"  (1.676 m)  Wt 177 lb 12.8 oz (80.65 kg)  BMI 28.70 kg/m2  SpO2 98% Wt Readings from Last 3 Encounters:  12/07/12 177 lb 12.8 oz (80.65 kg)  07/03/12 177 lb 8 oz (80.513 kg)  08/23/11 180 lb 12.8 oz (82.01 kg)   Constitutional: She appears well-developed and well-nourished. No distress.   Cardiovascular: Normal rate, regular rhythm and normal heart sounds.  No murmur heard. No BLE edema. Pulmonary/Chest: Effort normal and breath sounds normal. No respiratory distress. She has no wheezes.  Abdominal: Soft. Bowel sounds are normal. She exhibits no distension. There is no tenderness. no masses Rectal: normal tone, firm normal color stool in vault - no mass, no pain - FOB neg (supervised by Connye Burkitt PA student) Neurological: She is alert and oriented to person, place, and time. No cranial nerve deficit. Coordination normal.  Psychiatric: She has a normal mood and affect. Her behavior is normal. Judgment and thought content normal.   Lab Results  Component Value Date   WBC 7.1 06/29/2012   HGB 14.8 06/29/2012   HCT 43.8 06/29/2012   PLT 184.0 06/29/2012   GLUCOSE 104* 06/29/2012   CHOL 187 06/29/2012   TRIG 56.0 06/29/2012   HDL 70.40 06/29/2012   LDLDIRECT 118.1 10/09/2010    LDLCALC 105* 06/29/2012   ALT 30 07/03/2012   AST 30 07/03/2012   NA 136 06/29/2012   K 5.0 06/29/2012   CL 105 06/29/2012   CREATININE 0.8 06/29/2012   BUN 15 06/29/2012   CO2 26 06/29/2012   TSH 2.16 06/29/2012   HGBA1C 5.3 07/03/2012       Assessment & Plan:   White BM/change stool - check LFTs and CBC Consider imaging if abnormal labs If normal labs, reassurance and pursue additional testing only if recurrent symptoms

## 2013-01-16 ENCOUNTER — Other Ambulatory Visit: Payer: Self-pay

## 2013-05-28 ENCOUNTER — Ambulatory Visit (INDEPENDENT_AMBULATORY_CARE_PROVIDER_SITE_OTHER): Payer: 59 | Admitting: Internal Medicine

## 2013-05-28 ENCOUNTER — Encounter: Payer: Self-pay | Admitting: Internal Medicine

## 2013-05-28 VITALS — BP 124/82 | HR 60 | Temp 98.3°F

## 2013-05-28 DIAGNOSIS — M79672 Pain in left foot: Secondary | ICD-10-CM

## 2013-05-28 DIAGNOSIS — M79609 Pain in unspecified limb: Secondary | ICD-10-CM

## 2013-05-28 DIAGNOSIS — IMO0002 Reserved for concepts with insufficient information to code with codable children: Secondary | ICD-10-CM

## 2013-05-28 DIAGNOSIS — M5416 Radiculopathy, lumbar region: Secondary | ICD-10-CM

## 2013-05-28 MED ORDER — PREDNISONE (PAK) 10 MG PO TABS: 10.0000 mg | ORAL_TABLET | ORAL | Status: DC

## 2013-05-28 NOTE — Patient Instructions (Signed)
It was good to see you today. We have reviewed your prior records including labs and tests today Pred taper x 6 days for radicular symptoms - Your prescription(s) have been submitted to your pharmacy. Please take as directed and contact our office if you believe you are having problem(s) with the medication(s). Gentle back exercises - see below If symptoms unimproved in next 2 weeks, call/Mychart for additional workup as needed  Back Exercises Back exercises help treat and prevent back injuries. The goal is to increase your strength in your belly (abdominal) and back muscles. These exercises can also help with flexibility. Start these exercises when told by your doctor. HOME CARE Back exercises include: Pelvic Tilt.  Lie on your back with your knees bent. Tilt your pelvis until the lower part of your back is against the floor. Hold this position 5 to 10 sec. Repeat this exercise 5 to 10 times. Knee to Chest.  Pull 1 knee up against your chest and hold for 20 to 30 seconds. Repeat this with the other knee. This may be done with the other leg straight or bent, whichever feels better. Then, pull both knees up against your chest. Sit-Ups or Curl-Ups.  Bend your knees 90 degrees. Start with tilting your pelvis, and do a partial, slow sit-up. Only lift your upper half 30 to 45 degrees off the floor. Take at least 2 to 3 seonds for each sit-up. Do not do sit-ups with your knees out straight. If partial sit-ups are difficult, simply do the above but with only tightening your belly (abdominal) muscles and holding it as told. Hip-Lift.  Lie on your back with your knees flexed 90 degrees. Push down with your feet and shoulders as you raise your hips 2 inches off the floor. Hold for 10 seconds, repeat 5 to 10 times. Back Arches.  Lie on your stomach. Prop yourself up on bent elbows. Slowly press on your hands, causing an arch in your low back. Repeat 3 to 5 times. Shoulder-Lifts.  Lie face down with  arms beside your body. Keep hips and belly pressed to floor as you slowly lift your head and shoulders off the floor. Do not overdo your exercises. Be careful in the beginning. Exercises may cause you some mild back discomfort. If the pain lasts for more than 15 minutes, stop the exercises until you see your doctor. Improvement with exercise for back problems is slow.  Document Released: 12/21/2010 Document Revised: 02/10/2012 Document Reviewed: 09/19/2011 [facility] Patient Information 2014 [place], [place].

## 2013-05-29 NOTE — Progress Notes (Signed)
  Subjective:    Patient ID: Kim Anderson, female    DOB: [birth year], 62 y.o.   MRN: [id number]  HPI  Complains of left foot numbness - Feels "asleep" Symptoms located from instep across top of foot to anterior shin and lateral side of distal leg Denies trauma Denies history of same she denies association with swelling, color change, pain Symptoms not improved with position change, activity, over-the-counter anti-inflammatories or rest  Past Medical History  Diagnosis Date  . Mitral valve prolapse     responded to beta-blockers. no evidence of this on 2-d echo  . History of chest pain   . Palpitations     Review of Systems  Constitutional: Negative for fever and fatigue.  Musculoskeletal: Negative for myalgias, back pain, joint swelling, arthralgias and gait problem.       Objective:   Physical Exam  BP 124/82  Pulse 60  Temp(Src) 98.3 F (36.8 C) (Oral)  SpO2 97% Wt Readings from Last 3 Encounters:  12/07/12 177 lb 12.8 oz (80.65 kg)  07/03/12 177 lb 8 oz (80.513 kg)  08/23/11 180 lb 12.8 oz (82.01 kg)   Constitutional: She appears well-developed and well-nourished. No distress.  Neck: Normal range of motion. Neck supple. No JVD present. No thyromegaly present.  Cardiovascular: Normal rate, regular rhythm and normal heart sounds.  No murmur heard. No BLE edema. Pulmonary/Chest: Effort normal and breath sounds normal. No respiratory distress. She has no wheezes.  Musculoskeletal: Back: full range of motion of thoracic and lumbar spine. Non tender to palpation. Negative straight leg raise. DTR's are symmetrically intact. Sensation intact in all dermatomes of the lower extremities. Full strength to manual muscle testing. patient is able to heel toe walk without difficulty and ambulates with antalgic gait.  Skin: Skin is warm and dry. No rash noted. No erythema.  Psychiatric: She has a normal mood and affect. Her behavior is normal. Judgment and thought content normal.    Lab Results  Component Value Date   WBC 7.2 12/07/2012   HGB 14.7 12/07/2012   HCT 42.7 12/07/2012   PLT 261.0 12/07/2012   GLUCOSE 104* 06/29/2012   CHOL 187 06/29/2012   TRIG 56.0 06/29/2012   HDL 70.40 06/29/2012   LDLDIRECT 118.1 10/09/2010   LDLCALC 105* 06/29/2012   ALT 16 12/07/2012   AST 19 12/07/2012   NA 136 06/29/2012   K 5.0 06/29/2012   CL 105 06/29/2012   CREATININE 0.8 06/29/2012   BUN 15 06/29/2012   CO2 26 06/29/2012   TSH 2.16 06/29/2012   HGBA1C 5.3 07/03/2012       Assessment & Plan:   Left lower leg and foot paresthesia - exam consistent with lumbar radiculopathy No evidence for motor weakness or gait disturbance Will treat with prednisone taper x6 days Back exercises provided Patient agrees to call if symptoms worsen or remain unimproved after 2 weeks of treatment for consideration of further imaging as needed Patient reports intermittent symptoms prior to onset and persistence in past 4 days

## 2013-06-14 ENCOUNTER — Encounter: Payer: Self-pay | Admitting: Internal Medicine

## 2013-06-14 DIAGNOSIS — M79672 Pain in left foot: Secondary | ICD-10-CM

## 2013-06-14 DIAGNOSIS — M5416 Radiculopathy, lumbar region: Secondary | ICD-10-CM

## 2013-06-15 ENCOUNTER — Encounter: Payer: Self-pay | Admitting: Internal Medicine

## 2013-06-17 ENCOUNTER — Encounter: Payer: Self-pay | Admitting: Internal Medicine

## 2013-06-17 ENCOUNTER — Ambulatory Visit (HOSPITAL_COMMUNITY)
Admission: RE | Admit: 2013-06-17 | Discharge: 2013-06-17 | Disposition: A | Discharge status: Home / Self Care | Payer: 59 | Source: Ambulatory Visit | Attending: Internal Medicine | Admitting: Internal Medicine

## 2013-06-17 DIAGNOSIS — M51379 Other intervertebral disc degeneration, lumbosacral region without mention of lumbar back pain or lower extremity pain: Secondary | ICD-10-CM | POA: Insufficient documentation

## 2013-06-17 DIAGNOSIS — M5137 Other intervertebral disc degeneration, lumbosacral region: Secondary | ICD-10-CM | POA: Insufficient documentation

## 2013-06-17 DIAGNOSIS — M5416 Radiculopathy, lumbar region: Secondary | ICD-10-CM

## 2013-06-17 DIAGNOSIS — M79672 Pain in left foot: Secondary | ICD-10-CM

## 2013-08-24 ENCOUNTER — Other Ambulatory Visit: Payer: Self-pay

## 2013-08-24 DIAGNOSIS — Z1231 Encounter for screening mammogram for malignant neoplasm of breast: Secondary | ICD-10-CM

## 2013-09-07 ENCOUNTER — Ambulatory Visit
Admission: RE | Admit: 2013-09-07 | Discharge: 2013-09-07 | Disposition: A | Discharge status: Home / Self Care | Payer: 59 | Source: Ambulatory Visit

## 2013-09-07 DIAGNOSIS — Z1231 Encounter for screening mammogram for malignant neoplasm of breast: Secondary | ICD-10-CM

## 2013-09-09 ENCOUNTER — Other Ambulatory Visit (INDEPENDENT_AMBULATORY_CARE_PROVIDER_SITE_OTHER): Payer: Self-pay | Admitting: General Surgery

## 2013-09-09 DIAGNOSIS — R928 Other abnormal and inconclusive findings on diagnostic imaging of breast: Secondary | ICD-10-CM

## 2013-09-13 ENCOUNTER — Ambulatory Visit: Payer: 59

## 2013-09-27 ENCOUNTER — Ambulatory Visit
Admission: RE | Admit: 2013-09-27 | Discharge: 2013-09-27 | Disposition: A | Discharge status: Home / Self Care | Payer: 59 | Source: Ambulatory Visit | Attending: General Surgery | Admitting: General Surgery

## 2013-09-27 DIAGNOSIS — R928 Other abnormal and inconclusive findings on diagnostic imaging of breast: Secondary | ICD-10-CM

## 2013-10-07 ENCOUNTER — Other Ambulatory Visit: Payer: Self-pay

## 2013-11-28 ENCOUNTER — Encounter: Payer: Self-pay | Admitting: Internal Medicine

## 2013-11-28 DIAGNOSIS — Z0000 Encounter for general adult medical examination without abnormal findings: Secondary | ICD-10-CM

## 2013-11-29 NOTE — Telephone Encounter (Signed)
Pt has [facility] entered cpx labs in computer...Kim Anderson

## 2013-12-06 ENCOUNTER — Other Ambulatory Visit (INDEPENDENT_AMBULATORY_CARE_PROVIDER_SITE_OTHER): Payer: 59

## 2013-12-06 DIAGNOSIS — Z0000 Encounter for general adult medical examination without abnormal findings: Secondary | ICD-10-CM

## 2013-12-06 LAB — BASIC METABOLIC PANEL
BUN: 12 mg/dL (ref 6–23)
CHLORIDE: 105 meq/L (ref 96–112)
CO2: 25 meq/L (ref 19–32)
CREATININE: 0.8 mg/dL (ref 0.4–1.2)
Calcium: 9.2 mg/dL (ref 8.4–10.5)
GFR: 78.73 mL/min (ref >60.00)
GLUCOSE: 104 mg/dL — AB (ref 70–99)
Potassium: 4.6 mEq/L (ref 3.5–5.1)
Sodium: 138 mEq/L (ref 135–145)

## 2013-12-06 LAB — URINALYSIS, ROUTINE W REFLEX MICROSCOPIC
BILIRUBIN URINE: NEGATIVE
KETONES UR: NEGATIVE
LEUKOCYTES UA: NEGATIVE
Nitrite: NEGATIVE
PH: 6 (ref 5.0–8.0)
Specific Gravity, Urine: 1.025 (ref 1.000–1.030)
Total Protein, Urine: NEGATIVE
URINE GLUCOSE: NEGATIVE
Urobilinogen, UA: 0.2 (ref 0.0–1.0)

## 2013-12-06 LAB — CBC WITH DIFFERENTIAL/PLATELET
BASOS ABS: 0 10*3/uL (ref 0.0–0.1)
Basophils Relative: 0.5 % (ref 0.0–3.0)
Eosinophils Absolute: 0.1 10*3/uL (ref 0.0–0.7)
Eosinophils Relative: 2.9 % (ref 0.0–5.0)
HEMATOCRIT: 44.7 % (ref 36.0–46.0)
HEMOGLOBIN: 15.4 g/dL — AB (ref 12.0–15.0)
LYMPHS ABS: 2 10*3/uL (ref 0.7–4.0)
Lymphocytes Relative: 42.7 % (ref 12.0–46.0)
MCHC: 34.4 g/dL (ref 30.0–36.0)
MCV: 89.9 fl (ref 78.0–100.0)
MONOS PCT: 9.2 % (ref 3.0–12.0)
Monocytes Absolute: 0.4 10*3/uL (ref 0.1–1.0)
NEUTROS ABS: 2.1 10*3/uL (ref 1.4–7.7)
Neutrophils Relative %: 44.7 % (ref 43.0–77.0)
Platelets: 218 10*3/uL (ref 150.0–400.0)
RBC: 4.98 Mil/uL (ref 3.87–5.11)
RDW: 12.6 % (ref 11.5–14.6)
WBC: 4.6 10*3/uL (ref 4.5–10.5)

## 2013-12-06 LAB — LIPID PANEL
CHOL/HDL RATIO: 4
CHOLESTEROL: 189 mg/dL (ref 0–200)
HDL: 49.1 mg/dL (ref >39.00)
LDL Cholesterol: 124 mg/dL — ABNORMAL HIGH (ref 0–99)
TRIGLYCERIDES: 80 mg/dL (ref 0.0–149.0)
VLDL: 16 mg/dL (ref 0.0–40.0)

## 2013-12-06 LAB — HEPATIC FUNCTION PANEL
ALK PHOS: 56 U/L (ref 39–117)
ALT: 15 U/L (ref 0–35)
AST: 20 U/L (ref 0–37)
Albumin: 4.5 g/dL (ref 3.5–5.2)
BILIRUBIN DIRECT: 0.1 mg/dL (ref 0.0–0.3)
BILIRUBIN TOTAL: 0.8 mg/dL (ref 0.3–1.2)
TOTAL PROTEIN: 7.8 g/dL (ref 6.0–8.3)

## 2013-12-06 LAB — TSH: TSH: 2.59 u[IU]/mL (ref 0.35–5.50)

## 2013-12-08 ENCOUNTER — Ambulatory Visit (INDEPENDENT_AMBULATORY_CARE_PROVIDER_SITE_OTHER): Payer: 59 | Admitting: Internal Medicine

## 2013-12-08 ENCOUNTER — Encounter: Payer: Self-pay | Admitting: Internal Medicine

## 2013-12-08 VITALS — BP 128/80 | HR 62 | Temp 97.5°F | Ht 66.0 in | Wt 179.0 lb

## 2013-12-08 DIAGNOSIS — Z0000 Encounter for general adult medical examination without abnormal findings: Secondary | ICD-10-CM

## 2013-12-08 MED ORDER — BENZONATATE 200 MG PO CAPS: 200.0000 mg | ORAL_CAPSULE | Freq: Three times a day (TID) | ORAL | Status: DC | PRN

## 2013-12-08 NOTE — Progress Notes (Signed)
Pre-visit discussion using our clinic review tool. No additional management support is needed unless otherwise documented below in the visit note.  

## 2013-12-08 NOTE — Patient Instructions (Addendum)
It was good to see you today.  We have reviewed your prior records including labs and tests today  Health Maintenance reviewed - all recommended immunizations and age-appropriate screenings are up-to-date.  Call us at future time re: your Shingles vaccination! Nurse visit only ok for this.  Medications reviewed and updated, no changes recommended at this time.  for tessalon to help cough as needed - Your prescription(s) have been submitted to your pharmacy. Please take as directed and contact our office if you believe you are having problem(s) with the medication(s).  Please schedule followup in 12 months for annual exam and labs, call sooner if problems.  Work on lifestyle changes as discussed (low fat, low carb, increased protein diet; improved exercise efforts; weight loss) to control sugar, blood pressure and cholesterol levels and/or reduce risk of developing other medical problems. Look into http://vang.com/ or other type of food journal to assist you in this process.  Health Maintenance, Female A healthy lifestyle and preventative care can promote health and wellness.  Maintain regular health, dental, and eye exams.  Eat a healthy diet. Foods like vegetables, fruits, whole grains, low-fat dairy products, and lean protein foods contain the nutrients you need without too many calories. Decrease your intake of foods high in solid fats, added sugars, and salt. Get information about a proper diet from your caregiver, if necessary.  Regular physical exercise is one of the most important things you can do for your health. Most adults should get at least 150 minutes of moderate-intensity exercise (any activity that increases your heart rate and causes you to sweat) each week. In addition, most adults need muscle-strengthening exercises on 2 or more days a week.   Maintain a healthy weight. The body mass index (BMI) is a screening tool to identify possible weight problems. It provides an  estimate of body fat based on height and weight. Your caregiver can help determine your BMI, and can help you achieve or maintain a healthy weight. For adults 20 years and older:  A BMI below 18.5 is considered underweight.  A BMI of 18.5 to 24.9 is normal.  A BMI of 25 to 29.9 is considered overweight.  A BMI of 30 and above is considered obese.  Maintain normal blood lipids and cholesterol by exercising and minimizing your intake of saturated fat. Eat a balanced diet with plenty of fruits and vegetables. Blood tests for lipids and cholesterol should begin at age 54 and be repeated every 5 years. If your lipid or cholesterol levels are high, you are over 50, or you are a high risk for heart disease, you may need your cholesterol levels checked more frequently.Ongoing high lipid and cholesterol levels should be treated with medicines if diet and exercise are not effective.  If you smoke, find out from your caregiver how to quit. If you do not use tobacco, do not start.  Lung cancer screening is recommended for adults aged 71 80 years who are at high risk for developing lung cancer because of a history of smoking. Yearly low-dose computed tomography (CT) is recommended for people who have at least a 30-pack-year history of smoking and are a current smoker or have quit within the past 15 years. A pack year of smoking is smoking an average of 1 pack of cigarettes a day for 1 year (for example: 1 pack a day for 30 years or 2 packs a day for 15 years). Yearly screening should continue until the smoker has stopped smoking for at  least 15 years. Yearly screening should also be stopped for people who develop a health problem that would prevent them from having lung cancer treatment.  If you are pregnant, do not drink alcohol. If you are breastfeeding, be very cautious about drinking alcohol. If you are not pregnant and choose to drink alcohol, do not exceed 1 drink per day. One drink is considered to be 12  ounces (355 mL) of beer, 5 ounces (148 mL) of wine, or 1.5 ounces (44 mL) of liquor.  Avoid use of street drugs. Do not share needles with anyone. Ask for help if you need support or instructions about stopping the use of drugs.  High blood pressure causes heart disease and increases the risk of stroke. Blood pressure should be checked at least every 1 to 2 years. Ongoing high blood pressure should be treated with medicines, if weight loss and exercise are not effective.  If you are 26 to 61 years old, ask your caregiver if you should take aspirin to prevent strokes.  Diabetes screening involves taking a blood sample to check your fasting blood sugar level. This should be done once every 3 years, after age 48, if you are within normal weight and without risk factors for diabetes. Testing should be considered at a younger age or be carried out more frequently if you are overweight and have at least 1 risk factor for diabetes.  Breast cancer screening is essential preventative care for women. You should practice "breast self-awareness." This means understanding the normal appearance and feel of your breasts and may include breast self-examination. Any changes detected, no matter how small, should be reported to a caregiver. Women in their 7s and 30s should have a clinical breast exam (CBE) by a caregiver as part of a regular health exam every 1 to 3 years. After age 89, women should have a CBE every year. Starting at age 72, women should consider having a mammogram (breast X-ray) every year. Women who have a family history of breast cancer should talk to their caregiver about genetic screening. Women at a high risk of breast cancer should talk to their caregiver about having an MRI and a mammogram every year.  Breast cancer gene (BRCA)-related cancer risk assessment is recommended for women who have family members with BRCA-related cancers. BRCA-related cancers include breast, ovarian, tubal, and  peritoneal cancers. Having family members with these cancers may be associated with an increased risk for harmful changes (mutations) in the breast cancer genes BRCA1 and BRCA2. Results of the assessment will determine the need for genetic counseling and BRCA1 and BRCA2 testing.  The Pap test is a screening test for cervical cancer. Women should have a Pap test starting at age 12. Between ages 42 and 48, Pap tests should be repeated every 2 years. Beginning at age 30, you should have a Pap test every 3 years as long as the past 3 Pap tests have been normal. If you had a hysterectomy for a problem that was not cancer or a condition that could lead to cancer, then you no longer need Pap tests. If you are between ages 52 and 60, and you have had normal Pap tests going back 10 years, you no longer need Pap tests. If you have had past treatment for cervical cancer or a condition that could lead to cancer, you need Pap tests and screening for cancer for at least 20 years after your treatment. If Pap tests have been discontinued, risk factors (such as a  new sexual partner) need to be reassessed to determine if screening should be resumed. Some women have medical problems that increase the chance of getting cervical cancer. In these cases, your caregiver may recommend more frequent screening and Pap tests.  The human papillomavirus (HPV) test is an additional test that may be used for cervical cancer screening. The HPV test looks for the virus that can cause the cell changes on the cervix. The cells collected during the Pap test can be tested for HPV. The HPV test could be used to screen women aged 69 years and older, and should be used in women of any age who have unclear Pap test results. After the age of 3, women should have HPV testing at the same frequency as a Pap test.  Colorectal cancer can be detected and often prevented. Most routine colorectal cancer screening begins at the age of 9 and continues through  age 54. However, your caregiver may recommend screening at an earlier age if you have risk factors for colon cancer. On a yearly basis, your caregiver may provide home test kits to check for hidden blood in the stool. Use of a small camera at the end of a tube, to directly examine the colon (sigmoidoscopy or colonoscopy), can detect the earliest forms of colorectal cancer. Talk to your caregiver about this at age 17, when routine screening begins. Direct examination of the colon should be repeated every 5 to 10 years through age 57, unless early forms of pre-cancerous polyps or small growths are found.  Hepatitis C blood testing is recommended for all people born from [birth year] through 1963 and any individual with known risks for hepatitis C.  Practice safe sex. Use condoms and avoid high-risk sexual practices to reduce the spread of sexually transmitted infections (STIs). Sexually active women aged 60 and younger should be checked for Chlamydia, which is a common sexually transmitted infection. Older women with new or multiple partners should also be tested for Chlamydia. Testing for other STIs is recommended if you are sexually active and at increased risk.  Osteoporosis is a disease in which the bones lose minerals and strength with aging. This can result in serious bone fractures. The risk of osteoporosis can be identified using a bone density scan. Women ages 22 and over and women at risk for fractures or osteoporosis should discuss screening with their caregivers. Ask your caregiver whether you should be taking a calcium supplement or vitamin D to reduce the rate of osteoporosis.  Menopause can be associated with physical symptoms and risks. Hormone replacement therapy is available to decrease symptoms and risks. You should talk to your caregiver about whether hormone replacement therapy is right for you.  Use sunscreen. Apply sunscreen liberally and repeatedly throughout the day. You should seek shade  when your shadow is shorter than you. Protect yourself by wearing long sleeves, pants, a wide-brimmed hat, and sunglasses year round, whenever you are outdoors.  Notify your caregiver of new moles or changes in moles, especially if there is a change in shape or color. Also notify your caregiver if a mole is larger than the size of a pencil eraser.  Stay current with your immunizations. Document Released: 06/03/2011 Document Revised: 03/15/2013 Document Reviewed: 06/03/2011 [facility] Patient Information 2014 [place].

## 2013-12-08 NOTE — Progress Notes (Signed)
Subjective:    Patient ID: Kim SkiffLaura S Anderson, female    DOB: 02/09/1951, 63 y.o.   MRN: [id number]  HPI   patient is here today for annual physical. Patient feels well overall -  also reviewed chronic medical issues interval medical events  Past Medical History  Diagnosis Date  . Mitral valve prolapse     responded to beta-blockers. no evidence of this on 2-d echo  . History of chest pain   . Palpitations    Family History  Problem Relation Age of Onset  . Diabetes      parent  . Hypertension      parent   History  Substance Use Topics  . Smoking status: Never Smoker   . Smokeless tobacco: Not on file  . Alcohol Use: No    Review of Systems  Constitutional: Negative for fatigue and unexpected weight change.  Respiratory: Negative for cough, shortness of breath and wheezing.   Cardiovascular: Negative for chest pain, palpitations and leg swelling.  Gastrointestinal: Negative for nausea, abdominal pain and diarrhea.  Neurological: Negative for dizziness, weakness, light-headedness and headaches.  Psychiatric/Behavioral: Negative for dysphoric mood. The patient is not nervous/anxious.   All other systems reviewed and are negative.       Objective:   Physical Exam  BP 128/80  Pulse 62  Temp(Src) 97.5 F (36.4 C) (Oral)  Ht 5\' 6"  (1.676 m)  Wt 179 lb (81.194 kg)  BMI 28.91 kg/m2  SpO2 97% Wt Readings from Last 3 Encounters:  12/08/13 179 lb (81.194 kg)  12/07/12 177 lb 12.8 oz (80.65 kg)  07/03/12 177 lb 8 oz (80.513 kg)   Constitutional: She appears well-developed and well-nourished. No distress.  HENT: Head: Normocephalic and atraumatic. Ears: B TMs ok, no erythema or effusion; Nose: Nose normal. Mouth/Throat: Oropharynx is clear and moist. No oropharyngeal exudate.  Eyes: Conjunctivae and EOM are normal. Pupils are equal, round, and reactive to light. No scleral icterus.  Neck: Normal range of motion. Neck supple. No JVD present. No thyromegaly present.   Cardiovascular: Normal rate, regular rhythm and normal heart sounds.  No murmur heard. No BLE edema. Pulmonary/Chest: Effort normal and breath sounds normal. No respiratory distress. She has no wheezes.  Abdominal: Soft. Bowel sounds are normal. She exhibits no distension. There is no tenderness. no masses Musculoskeletal: Normal range of motion, no joint effusions. No gross deformities Neurological: She is alert and oriented to person, place, and time. No cranial nerve deficit. Coordination normal.  Skin: Skin is warm and dry. No rash noted. No erythema.  Psychiatric: She has a normal mood and affect. Her behavior is normal. Judgment and thought content normal.   Lab Results  Component Value Date   WBC 4.6 12/06/2013   HGB 15.4* 12/06/2013   HCT 44.7 12/06/2013   PLT 218.0 12/06/2013   GLUCOSE 104* 12/06/2013   CHOL 189 12/06/2013   TRIG 80.0 12/06/2013   HDL 49.10 12/06/2013   LDLDIRECT 118.1 10/09/2010   LDLCALC 124* 12/06/2013   ALT 15 12/06/2013   AST 20 12/06/2013   NA 138 12/06/2013   K 4.6 12/06/2013   CL 105 12/06/2013   CREATININE 0.8 12/06/2013   BUN 12 12/06/2013   CO2 25 12/06/2013   TSH 2.59 12/06/2013   HGBA1C 5.3 07/03/2012        Assessment & Plan:  CPX/v70.0 - Patient has been counseled on age-appropriate routine health concerns for screening and prevention. These are reviewed and up-to-date. Immunizations are up-to-date or  declined. Labs reviewed.

## 2014-01-14 ENCOUNTER — Encounter: Payer: Self-pay | Admitting: Internal Medicine

## 2014-01-14 DIAGNOSIS — Z8349 Family history of other endocrine, nutritional and metabolic diseases: Secondary | ICD-10-CM

## 2014-01-17 ENCOUNTER — Other Ambulatory Visit: Payer: Self-pay | Admitting: Internal Medicine

## 2014-01-18 ENCOUNTER — Encounter: Payer: Self-pay | Admitting: Internal Medicine

## 2014-01-24 ENCOUNTER — Other Ambulatory Visit (INDEPENDENT_AMBULATORY_CARE_PROVIDER_SITE_OTHER): Payer: 59

## 2014-01-24 DIAGNOSIS — Z8349 Family history of other endocrine, nutritional and metabolic diseases: Secondary | ICD-10-CM

## 2014-01-24 LAB — IRON AND TIBC
%SAT: 41 % (ref 20–55)
Iron: 136 ug/dL (ref 42–145)
TIBC: 328 ug/dL (ref 250–470)
UIBC: 192 ug/dL (ref 125–400)

## 2014-01-24 LAB — IRON: IRON: 127 ug/dL (ref 42–145)

## 2014-01-24 LAB — FERRITIN: Ferritin: 76 ng/mL (ref 10.0–291.0)

## 2014-01-25 ENCOUNTER — Ambulatory Visit (INDEPENDENT_AMBULATORY_CARE_PROVIDER_SITE_OTHER): Payer: Self-pay | Admitting: Family Medicine

## 2014-01-25 DIAGNOSIS — Z713 Dietary counseling and surveillance: Secondary | ICD-10-CM

## 2014-09-13 ENCOUNTER — Other Ambulatory Visit: Payer: Self-pay

## 2014-09-13 DIAGNOSIS — Z1239 Encounter for other screening for malignant neoplasm of breast: Secondary | ICD-10-CM

## 2014-09-22 ENCOUNTER — Other Ambulatory Visit: Payer: Self-pay

## 2014-09-22 DIAGNOSIS — Z1231 Encounter for screening mammogram for malignant neoplasm of breast: Secondary | ICD-10-CM

## 2014-09-29 ENCOUNTER — Ambulatory Visit
Admission: RE | Admit: 2014-09-29 | Discharge: 2014-09-29 | Disposition: A | Discharge status: Home / Self Care | Payer: 59 | Source: Ambulatory Visit

## 2014-09-29 DIAGNOSIS — Z1231 Encounter for screening mammogram for malignant neoplasm of breast: Secondary | ICD-10-CM

## 2014-10-04 ENCOUNTER — Encounter: Payer: Self-pay | Admitting: Internal Medicine

## 2015-02-28 ENCOUNTER — Other Ambulatory Visit: Payer: Self-pay | Admitting: Internal Medicine

## 2015-04-10 ENCOUNTER — Ambulatory Visit (INDEPENDENT_AMBULATORY_CARE_PROVIDER_SITE_OTHER): Payer: 59 | Admitting: Internal Medicine

## 2015-04-10 ENCOUNTER — Encounter: Payer: Self-pay | Admitting: Internal Medicine

## 2015-04-10 ENCOUNTER — Other Ambulatory Visit (INDEPENDENT_AMBULATORY_CARE_PROVIDER_SITE_OTHER): Payer: 59

## 2015-04-10 VITALS — BP 122/76 | HR 64 | Temp 98.5°F | Resp 14 | Ht 66.0 in | Wt 184.0 lb

## 2015-04-10 DIAGNOSIS — Z299 Encounter for prophylactic measures, unspecified: Secondary | ICD-10-CM

## 2015-04-10 DIAGNOSIS — Z23 Encounter for immunization: Secondary | ICD-10-CM | POA: Diagnosis not present

## 2015-04-10 DIAGNOSIS — Z0000 Encounter for general adult medical examination without abnormal findings: Secondary | ICD-10-CM | POA: Insufficient documentation

## 2015-04-10 DIAGNOSIS — R002 Palpitations: Secondary | ICD-10-CM

## 2015-04-10 DIAGNOSIS — R739 Hyperglycemia, unspecified: Secondary | ICD-10-CM

## 2015-04-10 LAB — COMPREHENSIVE METABOLIC PANEL
ALT: 14 U/L (ref 0–35)
AST: 18 U/L (ref 0–37)
Albumin: 4.5 g/dL (ref 3.5–5.2)
Alkaline Phosphatase: 69 U/L (ref 39–117)
BILIRUBIN TOTAL: 1 mg/dL (ref 0.2–1.2)
BUN: 18 mg/dL (ref 6–23)
CALCIUM: 10.8 mg/dL — AB (ref 8.4–10.5)
CO2: 28 mEq/L (ref 19–32)
CREATININE: 0.83 mg/dL (ref 0.40–1.20)
Chloride: 103 mEq/L (ref 96–112)
GFR: 74.04 mL/min (ref >60.00)
GLUCOSE: 105 mg/dL — AB (ref 70–99)
Potassium: 4.6 mEq/L (ref 3.5–5.1)
Sodium: 136 mEq/L (ref 135–145)
Total Protein: 7.7 g/dL (ref 6.0–8.3)

## 2015-04-10 LAB — CBC
HCT: 45 % (ref 36.0–46.0)
HEMOGLOBIN: 15.6 g/dL — AB (ref 12.0–15.0)
MCHC: 34.8 g/dL (ref 30.0–36.0)
MCV: 87.6 fl (ref 78.0–100.0)
Platelets: 211 10*3/uL (ref 150.0–400.0)
RBC: 5.14 Mil/uL — ABNORMAL HIGH (ref 3.87–5.11)
RDW: 13.2 % (ref 11.5–15.5)
WBC: 6.1 10*3/uL (ref 4.0–10.5)

## 2015-04-10 LAB — LIPID PANEL
CHOL/HDL RATIO: 3
Cholesterol: 205 mg/dL — ABNORMAL HIGH (ref 0–200)
HDL: 80.1 mg/dL (ref >39.00)
LDL CALC: 114 mg/dL — AB (ref 0–99)
NonHDL: 124.9
TRIGLYCERIDES: 55 mg/dL (ref 0.0–149.0)
VLDL: 11 mg/dL (ref 0.0–40.0)

## 2015-04-10 LAB — HEMOGLOBIN A1C: HEMOGLOBIN A1C: 5.5 % (ref 4.6–6.5)

## 2015-04-10 MED ORDER — ASPIRIN 81 MG PO TBEC: 81.0000 mg | DELAYED_RELEASE_TABLET | Freq: Every day | ORAL | Status: DC

## 2015-04-10 MED ORDER — PREDNISONE 10 MG PO TABS: ORAL_TABLET | ORAL | Status: DC

## 2015-04-10 MED ORDER — B COMPLEX PO TABS: 1.0000 | ORAL_TABLET | Freq: Every day | ORAL | Status: DC

## 2015-04-10 MED ORDER — FISH OIL 1000 MG PO CAPS: 1000.0000 mg | ORAL_CAPSULE | Freq: Every day | ORAL | Status: DC

## 2015-04-10 MED ORDER — CALCIUM-D 600-400 MG-UNIT PO TABS: ORAL_TABLET | ORAL | Status: DC

## 2015-04-10 NOTE — Assessment & Plan Note (Signed)
Still taking metoprolol for them, doing well and much less often.

## 2015-04-10 NOTE — Progress Notes (Signed)
   Subjective:    Patient ID: Kim SkiffLaura S Fennewald, female    DOB: 12/12/1950, 64 y.o.   MRN: [id number]  HPI The patient is a 64 YO female here for wellness. Has poison ivy the last 3 days and not improving. Has needed medrol dose pack in the past. No fevers, chills, SOB, facial swelling.   [facility], [facility], social history reviewed and updated.   Review of Systems  Constitutional: Negative for fever, chills, activity change, appetite change and unexpected weight change.  HENT: Negative.   Eyes: Negative.   Respiratory: Negative for cough, chest tightness, shortness of breath and wheezing.   Cardiovascular: Negative for chest pain, palpitations and leg swelling.  Gastrointestinal: Negative for abdominal pain, diarrhea, constipation and abdominal distention.  Musculoskeletal: Negative.   Skin: Positive for rash.  Neurological: Negative.   Psychiatric/Behavioral: Negative.       Objective:   Physical Exam  Constitutional: She is oriented to person, place, and time. She appears well-developed and well-nourished.  HENT:  Head: Normocephalic and atraumatic.  Eyes: EOM are normal.  Neck: Normal range of motion.  Cardiovascular: Normal rate and regular rhythm.   Pulmonary/Chest: Effort normal and breath sounds normal. No respiratory distress. She has no wheezes.  Abdominal: Soft. She exhibits no distension. There is no tenderness. There is no rebound.  Musculoskeletal: She exhibits no edema.  Neurological: She is alert and oriented to person, place, and time.  Skin: Skin is warm and dry.  Some poison ivy on the lower face and chin as well as on the hand (right)   Filed Vitals:   04/10/15 0955  BP: 122/76  Pulse: 64  Temp: 98.5 F (36.9 C)  TempSrc: Oral  Resp: 14  Height: 5\' 6"  (1.676 m)  Weight: 184 lb (83.462 kg)  SpO2: 97%      Assessment & Plan:  Shingles shot given today.

## 2015-04-10 NOTE — Assessment & Plan Note (Signed)
Checking HgA1c today.  

## 2015-04-10 NOTE — Addendum Note (Signed)
Addended by: Conception ChancyOBERSON, Afnan Emberton R on: 04/10/2015 10:55 AM   Modules accepted: Orders

## 2015-04-10 NOTE — Progress Notes (Signed)
Pre visit review using our clinic review tool, if applicable. No additional management support is needed unless otherwise documented below in the visit note. 

## 2015-04-10 NOTE — Patient Instructions (Addendum)
We will check your labs and call you back with the results. We have called in the medrol dose pack for you. Days 1-2 take 4 pills daily, days 3-4 take 2 pills daily, days 5-7 take 1 pill daily then stop.  Keep up the good work with your health.   If you need a gynecologist two good groups to try:  [facility]  [facility]  Come back in 1 year for your physical.

## 2015-04-10 NOTE — Assessment & Plan Note (Signed)
Shingles shot given today, pneumonia series done. Colonoscopy due in 2018. Due for pap smear and will go to gynecology. She is exercising with trainer and walking 3-4 times per week. Checking labs today. Medrol dose pack for poison ivy. Reminded her about sun safety and the need to use sunscreen to prevent or decrease her risk of skin cancer.

## 2015-04-13 ENCOUNTER — Encounter: Payer: Self-pay | Admitting: Dietician

## 2015-04-13 ENCOUNTER — Encounter: Payer: 59 | Attending: Internal Medicine | Admitting: Dietician

## 2015-04-13 DIAGNOSIS — R635 Abnormal weight gain: Secondary | ICD-10-CM | POA: Diagnosis not present

## 2015-04-13 DIAGNOSIS — Z713 Dietary counseling and surveillance: Secondary | ICD-10-CM | POA: Diagnosis not present

## 2015-04-13 NOTE — Progress Notes (Signed)
  Medical Nutrition Therapy:  Appt start time: 0900 end time:  0945.   Assessment:  Primary concerns today: [name] is here to discuss her weight and diabetes prevention. She lost weight a few years ago on Weight Watchers but regained it. She states she weighs as much as she has ever weighed. 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 Her work hours vary. She is active and eats a regular, balanced diet.   Preferred Learning Style:   No preference indicated   Learning Readiness:   Ready   MEDICATIONS: see list   DIETARY INTAKE:  Usual eating pattern includes 3 meals and 2-3 snacks per day.  Avoided foods include pinto beans, large amounts of meat.    24-hr recall:  B (6:30-7 AM): varies, hard boiled egg and blackberries OR yogurt OR eggs  Snk ( AM): fruit  L (1-2 PM): leftovers Snk (5 PM): nuts or fruit, sometimes cookies D ( PM): meat and vegetables or salad, starch Snk ( PM): ice cream sometimes  Beverages: water, hot tea, occasional diet soda, occasional glass of wine  Usual physical activity: walks 3.5 miles about 3x a week; personal training 1x a week for past 8 weeks  Estimated energy needs: 1600-1800 calories  Progress Towards Goal(s):  In progress.   Nutritional Diagnosis:  Vinings-3.4 Unintentional weight gain As related to advancing age and excessive carbohydrate intake.  As evidenced by patient report.    Intervention:  Nutrition counseling provided.  Teaching Method Utilized:  Visual Auditory  Handouts given during visit include:  Meal planning card  Barriers to learning/adherence to lifestyle change: none  Demonstrated degree of understanding via:  Teach Back   Monitoring/Evaluation:  Dietary intake, exercise, and body weight prn.

## 2015-10-12 ENCOUNTER — Other Ambulatory Visit: Payer: Self-pay

## 2015-10-12 DIAGNOSIS — Z1231 Encounter for screening mammogram for malignant neoplasm of breast: Secondary | ICD-10-CM

## 2015-11-01 ENCOUNTER — Ambulatory Visit
Admission: RE | Admit: 2015-11-01 | Discharge: 2015-11-01 | Disposition: A | Discharge status: Home / Self Care | Payer: 59 | Source: Ambulatory Visit

## 2015-11-01 DIAGNOSIS — Z1231 Encounter for screening mammogram for malignant neoplasm of breast: Secondary | ICD-10-CM

## 2015-12-03 HISTORY — PX: OTHER SURGICAL HISTORY: SHX169

## 2015-12-15 DIAGNOSIS — H52223 Regular astigmatism, bilateral: Secondary | ICD-10-CM | POA: Diagnosis not present

## 2015-12-15 DIAGNOSIS — H5213 Myopia, bilateral: Secondary | ICD-10-CM | POA: Diagnosis not present

## 2015-12-15 DIAGNOSIS — H524 Presbyopia: Secondary | ICD-10-CM | POA: Diagnosis not present

## 2016-01-03 MED FILL — METOPROLOL SUCC ER 25 MG TA: 25 | 90 days supply | Qty: 90 | Fill #2

## 2016-01-03 MED FILL — ASPIR-LOW EC 81 MG TABLET: 81 | 90 days supply | Qty: 90 | Fill #1

## 2016-01-09 MED FILL — RESTASIS 0.05% EYE EMULSION: 0.05 | 90 days supply | Qty: 180 | Fill #0

## 2016-01-18 ENCOUNTER — Other Ambulatory Visit: Payer: Self-pay | Admitting: General Practice

## 2016-01-18 MED ORDER — CALCIUM-D 600-400 MG-UNIT PO TABS: ORAL_TABLET | ORAL | Status: DC

## 2016-01-18 MED ORDER — FISH OIL 1000 MG PO CAPS: 1000.0000 mg | ORAL_CAPSULE | Freq: Every day | ORAL | Status: DC

## 2016-01-18 MED ORDER — B COMPLEX PO TABS: 1.0000 | ORAL_TABLET | Freq: Every day | ORAL | Status: DC

## 2016-01-18 MED FILL — CALCIUM 600 + VIT D 400 TAB: 600-400 | 150 days supply | Qty: 150 | Fill #0

## 2016-01-18 MED FILL — B COMPLEX TABLET: 100 days supply | Qty: 100 | Fill #0

## 2016-01-18 MED FILL — FISH OIL 1,000 MG CAPSULE: 1000 | 100 days supply | Qty: 100 | Fill #0

## 2016-03-18 ENCOUNTER — Telehealth: Payer: 59 | Admitting: Family

## 2016-03-18 DIAGNOSIS — L237 Allergic contact dermatitis due to plants, except food: Secondary | ICD-10-CM | POA: Diagnosis not present

## 2016-03-18 MED ORDER — PREDNISONE 10 MG (21) PO TBPK: 10.0000 mg | ORAL_TABLET | Freq: Every day | ORAL | Status: DC

## 2016-03-18 MED FILL — predniSONE 10 MG TABS: 10 | 6 days supply | Qty: 21 | Fill #0

## 2016-03-18 NOTE — Progress Notes (Signed)

## 2016-04-24 ENCOUNTER — Encounter: Payer: Self-pay | Admitting: Internal Medicine

## 2016-04-24 ENCOUNTER — Ambulatory Visit (INDEPENDENT_AMBULATORY_CARE_PROVIDER_SITE_OTHER): Payer: 59 | Admitting: Internal Medicine

## 2016-04-24 ENCOUNTER — Other Ambulatory Visit (INDEPENDENT_AMBULATORY_CARE_PROVIDER_SITE_OTHER): Payer: 59

## 2016-04-24 VITALS — BP 132/68 | HR 65 | Temp 98.5°F | Resp 16 | Ht 66.0 in | Wt 183.1 lb

## 2016-04-24 DIAGNOSIS — Z0000 Encounter for general adult medical examination without abnormal findings: Secondary | ICD-10-CM

## 2016-04-24 DIAGNOSIS — R002 Palpitations: Secondary | ICD-10-CM | POA: Diagnosis not present

## 2016-04-24 LAB — COMPREHENSIVE METABOLIC PANEL
ALT: 15 U/L (ref 0–35)
AST: 18 U/L (ref 0–37)
Albumin: 4.6 g/dL (ref 3.5–5.2)
Alkaline Phosphatase: 64 U/L (ref 39–117)
BUN: 10 mg/dL (ref 6–23)
CALCIUM: 9.5 mg/dL (ref 8.4–10.5)
CHLORIDE: 105 meq/L (ref 96–112)
CO2: 29 meq/L (ref 19–32)
CREATININE: 0.76 mg/dL (ref 0.40–1.20)
GFR: 81.68 mL/min (ref >60.00)
GLUCOSE: 98 mg/dL (ref 70–99)
Potassium: 4.5 mEq/L (ref 3.5–5.1)
Sodium: 140 mEq/L (ref 135–145)
Total Bilirubin: 1 mg/dL (ref 0.2–1.2)
Total Protein: 7 g/dL (ref 6.0–8.3)

## 2016-04-24 LAB — LIPID PANEL
CHOL/HDL RATIO: 3
Cholesterol: 204 mg/dL — ABNORMAL HIGH (ref 0–200)
HDL: 67.2 mg/dL (ref >39.00)
LDL CALC: 120 mg/dL — AB (ref 0–99)
NonHDL: 136.43
TRIGLYCERIDES: 81 mg/dL (ref 0.0–149.0)
VLDL: 16.2 mg/dL (ref 0.0–40.0)

## 2016-04-24 MED ORDER — METOPROLOL SUCCINATE ER 25 MG PO TB24: 25.0000 mg | ORAL_TABLET | Freq: Every day | ORAL | Status: DC

## 2016-04-24 MED FILL — METOPROLOL SUCC ER 25 MG TA: 25 | 90 days supply | Qty: 90 | Fill #0

## 2016-04-24 NOTE — Patient Instructions (Signed)
We have checked the EKG and it is normal and the same as last time about 5 years ago.   We will check the blood work and send them on mychart.   Keep up the good work with your health and please feel free to call us with any problems or questions before your next visit.   Health Maintenance, Female Adopting a healthy lifestyle and getting preventive care can go a long way to promote health and wellness. Talk with your health care provider about what schedule of regular examinations is right for you. This is a good chance for you to check in with your provider about disease prevention and staying healthy. In between checkups, there are plenty of things you can do on your own. Experts have done a lot of research about which lifestyle changes and preventive measures are most likely to keep you healthy. Ask your health care provider for more information. WEIGHT AND DIET  Eat a healthy diet  Be sure to include plenty of vegetables, fruits, low-fat dairy products, and lean protein.  Do not eat a lot of foods high in solid fats, added sugars, or salt.  Get regular exercise. This is one of the most important things you can do for your health.  Most adults should exercise for at least 150 minutes each week. The exercise should increase your heart rate and make you sweat (moderate-intensity exercise).  Most adults should also do strengthening exercises at least twice a week. This is in addition to the moderate-intensity exercise.  Maintain a healthy weight  Body mass index (BMI) is a measurement that can be used to identify possible weight problems. It estimates body fat based on height and weight. Your health care provider can help determine your BMI and help you achieve or maintain a healthy weight.  For females 70 years of age and older:   A BMI below 18.5 is considered underweight.  A BMI of 18.5 to 24.9 is normal.  A BMI of 25 to 29.9 is considered overweight.  A BMI of 30 and above is  considered obese.  Watch levels of cholesterol and blood lipids  You should start having your blood tested for lipids and cholesterol at 63 years of age, then have this test every 5 years.  You may need to have your cholesterol levels checked more often if:  Your lipid or cholesterol levels are high.  You are older than 63 years of age.  You are at high risk for heart disease.  CANCER SCREENING   Lung Cancer  Lung cancer screening is recommended for adults 27-60 years old who are at high risk for lung cancer because of a history of smoking.  A yearly low-dose CT scan of the lungs is recommended for people who:  Currently smoke.  Have quit within the past 15 years.  Have at least a 30-pack-year history of smoking. A pack year is smoking an average of one pack of cigarettes a day for 1 year.  Yearly screening should continue until it has been 15 years since you quit.  Yearly screening should stop if you develop a health problem that would prevent you from having lung cancer treatment.  Breast Cancer  Practice breast self-awareness. This means understanding how your breasts normally appear and feel.  It also means doing regular breast self-exams. Let your health care provider know about any changes, no matter how small.  If you are in your 20s or 30s, you should have a clinical breast  exam (CBE) by a health care provider every 1-3 years as part of a regular health exam.  If you are 24 or older, have a CBE every year. Also consider having a breast X-ray (mammogram) every year.  If you have a family history of breast cancer, talk to your health care provider about genetic screening.  If you are at high risk for breast cancer, talk to your health care provider about having an MRI and a mammogram every year.  Breast cancer gene (BRCA) assessment is recommended for women who have family members with BRCA-related cancers. BRCA-related cancers  include:  Breast.  Ovarian.  Tubal.  Peritoneal cancers.  Results of the assessment will determine the need for genetic counseling and BRCA1 and BRCA2 testing. Cervical Cancer Your health care provider may recommend that you be screened regularly for cancer of the pelvic organs (ovaries, uterus, and vagina). This screening involves a pelvic examination, including checking for microscopic changes to the surface of your cervix (Pap test). You may be encouraged to have this screening done every 3 years, beginning at age 78.  For women ages 68-65, health care providers may recommend pelvic exams and Pap testing every 3 years, or they may recommend the Pap and pelvic exam, combined with testing for human papilloma virus (HPV), every 5 years. Some types of HPV increase your risk of cervical cancer. Testing for HPV may also be done on women of any age with unclear Pap test results.  Other health care providers may not recommend any screening for nonpregnant women who are considered low risk for pelvic cancer and who do not have symptoms. Ask your health care provider if a screening pelvic exam is right for you.  If you have had past treatment for cervical cancer or a condition that could lead to cancer, you need Pap tests and screening for cancer for at least 20 years after your treatment. If Pap tests have been discontinued, your risk factors (such as having a new sexual partner) need to be reassessed to determine if screening should resume. Some women have medical problems that increase the chance of getting cervical cancer. In these cases, your health care provider may recommend more frequent screening and Pap tests. Colorectal Cancer  This type of cancer can be detected and often prevented.  Routine colorectal cancer screening usually begins at 63 years of age and continues through 63 years of age.  Your health care provider may recommend screening at an earlier age if you have risk factors for  colon cancer.  Your health care provider may also recommend using home test kits to check for hidden blood in the stool.  A small camera at the end of a tube can be used to examine your colon directly (sigmoidoscopy or colonoscopy). This is done to check for the earliest forms of colorectal cancer.  Routine screening usually begins at age 55.  Direct examination of the colon should be repeated every 5-10 years through 63 years of age. However, you may need to be screened more often if early forms of precancerous polyps or small growths are found. Skin Cancer  Check your skin from head to toe regularly.  Tell your health care provider about any new moles or changes in moles, especially if there is a change in a mole's shape or color.  Also tell your health care provider if you have a mole that is larger than the size of a pencil eraser.  Always use sunscreen. Apply sunscreen liberally and repeatedly  throughout the day.  Protect yourself by wearing long sleeves, pants, a wide-brimmed hat, and sunglasses whenever you are outside. HEART DISEASE, DIABETES, AND HIGH BLOOD PRESSURE   High blood pressure causes heart disease and increases the risk of stroke. High blood pressure is more likely to develop in:  People who have blood pressure in the high end of the normal range (130-139/85-89 mm Hg).  People who are overweight or obese.  People who are African American.  If you are 61-37 years of age, have your blood pressure checked every 3-5 years. If you are 49 years of age or older, have your blood pressure checked every year. You should have your blood pressure measured twice--once when you are at a hospital or clinic, and once when you are not at a hospital or clinic. Record the average of the two measurements. To check your blood pressure when you are not at a hospital or clinic, you can use:  An automated blood pressure machine at a pharmacy.  A home blood pressure monitor.  If you  are between 68 years and 50 years old, ask your health care provider if you should take aspirin to prevent strokes.  Have regular diabetes screenings. This involves taking a blood sample to check your fasting blood sugar level.  If you are at a normal weight and have a low risk for diabetes, have this test once every three years after 63 years of age.  If you are overweight and have a high risk for diabetes, consider being tested at a younger age or more often. PREVENTING INFECTION  Hepatitis B  If you have a higher risk for hepatitis B, you should be screened for this virus. You are considered at high risk for hepatitis B if:  You were born in a country where hepatitis B is common. Ask your health care provider which countries are considered high risk.  Your parents were born in a high-risk country, and you have not been immunized against hepatitis B (hepatitis B vaccine).  You have HIV or AIDS.  You use needles to inject street drugs.  You live with someone who has hepatitis B.  You have had sex with someone who has hepatitis B.  You get hemodialysis treatment.  You take certain medicines for conditions, including cancer, organ transplantation, and autoimmune conditions. Hepatitis C  Blood testing is recommended for:  Everyone born from [birth year] through 1963.  Anyone with known risk factors for hepatitis C. Sexually transmitted infections (STIs)  You should be screened for sexually transmitted infections (STIs) including gonorrhea and chlamydia if:  You are sexually active and are younger than 63 years of age.  You are older than 63 years of age and your health care provider tells you that you are at risk for this type of infection.  Your sexual activity has changed since you were last screened and you are at an increased risk for chlamydia or gonorrhea. Ask your health care provider if you are at risk.  If you do not have HIV, but are at risk, it may be recommended that you  take a prescription medicine daily to prevent HIV infection. This is called pre-exposure prophylaxis (PrEP). You are considered at risk if:  You are sexually active and do not regularly use condoms or know the HIV status of your partner(s).  You take drugs by injection.  You are sexually active with a partner who has HIV. Talk with your health care provider about whether you are at high  risk of being infected with HIV. If you choose to begin PrEP, you should first be tested for HIV. You should then be tested every 3 months for as long as you are taking PrEP.  PREGNANCY   If you are premenopausal and you may become pregnant, ask your health care provider about preconception counseling.  If you may become pregnant, take 400 to 800 micrograms (mcg) of folic acid every day.  If you want to prevent pregnancy, talk to your health care provider about birth control (contraception). OSTEOPOROSIS AND MENOPAUSE   Osteoporosis is a disease in which the bones lose minerals and strength with aging. This can result in serious bone fractures. Your risk for osteoporosis can be identified using a bone density scan.  If you are 40 years of age or older, or if you are at risk for osteoporosis and fractures, ask your health care provider if you should be screened.  Ask your health care provider whether you should take a calcium or vitamin D supplement to lower your risk for osteoporosis.  Menopause may have certain physical symptoms and risks.  Hormone replacement therapy may reduce some of these symptoms and risks. Talk to your health care provider about whether hormone replacement therapy is right for you.  HOME CARE INSTRUCTIONS   Schedule regular health, dental, and eye exams.  Stay current with your immunizations.   Do not use any tobacco products including cigarettes, chewing tobacco, or electronic cigarettes.  If you are pregnant, do not drink alcohol.  If you are breastfeeding, limit how  much and how often you drink alcohol.  Limit alcohol intake to no more than 1 drink per day for nonpregnant women. One drink equals 12 ounces of beer, 5 ounces of wine, or 1 ounces of hard liquor.  Do not use street drugs.  Do not share needles.  Ask your health care provider for help if you need support or information about quitting drugs.  Tell your health care provider if you often feel depressed.  Tell your health care provider if you have ever been abused or do not feel safe at home.   This information is not intended to replace advice given to you by your health care provider. Make sure you discuss any questions you have with your health care provider.   Document Released: 06/03/2011 Document Revised: 12/09/2014 Document Reviewed: 10/20/2013 Elsevier Interactive Patient Education [employer].

## 2016-04-24 NOTE — Progress Notes (Signed)
   Subjective:    Patient ID: Kim Anderson, female    DOB: 04/11/1951, 65 y.o.   MRN: [id number]  HPI The patient is a 65 YO female coming in for wellness. No new concerns. Please see A/P for status and treatment of medical problems.   [facility], [facility], social history reviewed and updated.   Review of Systems  Constitutional: Negative for fever, chills, activity change, appetite change and unexpected weight change.  HENT: Negative.   Eyes: Negative.   Respiratory: Negative for cough, chest tightness, shortness of breath and wheezing.   Cardiovascular: Negative for chest pain, palpitations and leg swelling.  Gastrointestinal: Negative for abdominal pain, diarrhea, constipation and abdominal distention.  Musculoskeletal: Negative.   Skin: Negative.   Neurological: Negative.   Psychiatric/Behavioral: Negative.       Objective:   Physical Exam  Constitutional: She is oriented to person, place, and time. She appears well-developed and well-nourished.  HENT:  Head: Normocephalic and atraumatic.  Eyes: EOM are normal.  Neck: Normal range of motion.  Cardiovascular: Normal rate and regular rhythm.   Carotids without bruit  Pulmonary/Chest: Effort normal and breath sounds normal. No respiratory distress. She has no wheezes.  Abdominal: Soft. She exhibits no distension. There is no tenderness. There is no rebound.  Musculoskeletal: She exhibits no edema.  Neurological: She is alert and oriented to person, place, and time.  Skin: Skin is warm and dry.   Filed Vitals:   04/24/16 1031  BP: 132/68  Pulse: 65  Temp: 98.5 F (36.9 C)  TempSrc: Oral  Resp: 16  Height: 5\' 6"  (1.676 m)  Weight: 183 lb 1.9 oz (83.063 kg)  SpO2: 99%   EKG: Rate 59, sinus, axis and intervals normal, no ST or t wave changes, no change from prior in 2012    Assessment & Plan:

## 2016-04-24 NOTE — Progress Notes (Signed)
Pre visit review using our clinic review tool, if applicable. No additional management support is needed unless otherwise documented below in the visit note. 

## 2016-04-24 NOTE — Assessment & Plan Note (Addendum)
Takes metoprolol and rare. If more frequent she will call. EKG stable.

## 2016-04-24 NOTE — Assessment & Plan Note (Signed)
Checking labs, exercising and non-smoker. EKG without changes from prior. Counseled about sun safety and mole surveillance. Immunizations up to date. Given health screening recommendations.

## 2016-04-25 ENCOUNTER — Other Ambulatory Visit: Payer: Self-pay | Admitting: Internal Medicine

## 2016-04-25 MED FILL — ASPIR-LOW EC 81 MG TABLET: 81 | 90 days supply | Qty: 90 | Fill #0

## 2016-06-14 ENCOUNTER — Encounter (HOSPITAL_COMMUNITY): Payer: Self-pay | Admitting: *Deleted

## 2016-06-14 ENCOUNTER — Emergency Department (HOSPITAL_COMMUNITY)
Admission: EM | Admit: 2016-06-14 | Discharge: 2016-06-14 | Disposition: A | Discharge status: Home / Self Care | Payer: PRIVATE HEALTH INSURANCE | Attending: Emergency Medicine | Admitting: Emergency Medicine

## 2016-06-14 ENCOUNTER — Emergency Department (HOSPITAL_COMMUNITY): Payer: PRIVATE HEALTH INSURANCE

## 2016-06-14 DIAGNOSIS — Y929 Unspecified place or not applicable: Secondary | ICD-10-CM | POA: Insufficient documentation

## 2016-06-14 DIAGNOSIS — Z7982 Long term (current) use of aspirin: Secondary | ICD-10-CM | POA: Insufficient documentation

## 2016-06-14 DIAGNOSIS — S42252A Displaced fracture of greater tuberosity of left humerus, initial encounter for closed fracture: Secondary | ICD-10-CM | POA: Insufficient documentation

## 2016-06-14 DIAGNOSIS — S4992XA Unspecified injury of left shoulder and upper arm, initial encounter: Secondary | ICD-10-CM | POA: Diagnosis present

## 2016-06-14 DIAGNOSIS — W010XXA Fall on same level from slipping, tripping and stumbling without subsequent striking against object, initial encounter: Secondary | ICD-10-CM | POA: Diagnosis not present

## 2016-06-14 DIAGNOSIS — Y9389 Activity, other specified: Secondary | ICD-10-CM | POA: Diagnosis not present

## 2016-06-14 DIAGNOSIS — M79602 Pain in left arm: Secondary | ICD-10-CM

## 2016-06-14 DIAGNOSIS — S42309A Unspecified fracture of shaft of humerus, unspecified arm, initial encounter for closed fracture: Secondary | ICD-10-CM | POA: Insufficient documentation

## 2016-06-14 DIAGNOSIS — Y999 Unspecified external cause status: Secondary | ICD-10-CM | POA: Insufficient documentation

## 2016-06-14 MED ORDER — HYDROMORPHONE HCL 1 MG/ML IJ SOLN
1.0000 mg | Freq: Once | INTRAMUSCULAR | Status: AC
  Administered 2016-06-14: 1 mg via INTRAVENOUS
  Filled 2016-06-14: qty 1

## 2016-06-14 MED ORDER — METHOCARBAMOL 500 MG PO TABS
500.0000 mg | ORAL_TABLET | Freq: Once | ORAL | Status: AC
  Administered 2016-06-14: 500 mg via ORAL
  Filled 2016-06-14: qty 1

## 2016-06-14 MED ORDER — METHOCARBAMOL 500 MG PO TABS: 500.0000 mg | ORAL_TABLET | Freq: Two times a day (BID) | ORAL | Status: DC

## 2016-06-14 MED ORDER — OXYCODONE-ACETAMINOPHEN 5-325 MG PO TABS
2.0000 | ORAL_TABLET | Freq: Once | ORAL | Status: AC
  Administered 2016-06-14: 2 via ORAL
  Filled 2016-06-14: qty 2

## 2016-06-14 MED ORDER — OXYCODONE-ACETAMINOPHEN 5-325 MG PO TABS: 1.0000 | ORAL_TABLET | Freq: Four times a day (QID) | ORAL | Status: DC | PRN

## 2016-06-14 MED ORDER — ONDANSETRON 4 MG PO TBDP
8.0000 mg | ORAL_TABLET | Freq: Once | ORAL | Status: DC
  Filled 2016-06-14: qty 2

## 2016-06-14 MED ORDER — ONDANSETRON HCL 4 MG/2ML IJ SOLN
4.0000 mg | Freq: Once | INTRAMUSCULAR | Status: AC
  Administered 2016-06-14: 4 mg via INTRAVENOUS
  Filled 2016-06-14: qty 2

## 2016-06-14 MED FILL — OXYCODONE/APAP 5-325: 5-325 | 3 days supply | Qty: 20 | Fill #0

## 2016-06-14 MED FILL — METHOCARBAMOL 500 MG TABLET: 500 | 10 days supply | Qty: 20 | Fill #0

## 2016-06-14 NOTE — ED Notes (Signed)
Pt is in stable condition upon d/c and is escorted from ED via wheelchair. 

## 2016-06-14 NOTE — ED Notes (Signed)
Pt was working upstairs when she fell on her left shoulder.  Pain and inability to move her left arm at this time.

## 2016-06-14 NOTE — ED Provider Notes (Signed)
CSN: [id number]     Arrival date & time 06/14/16  1516 History   First MD Initiated Contact with Patient 06/14/16 1525     Chief Complaint  Patient presents with  . Shoulder Injury     (Consider location/radiation/quality/duration/timing/severity/associated sxs/prior Treatment) HPI Comments: Patient presents today with pain of the left shoulder and left humurus.  She states that she tripped while getting up from a chair just prior to arrival and landed on her left shoulder.  She states that she has been unable to move her shoulder since that time.  Denies any pain of the elbow or wrist.  Denies neck or back pain.  She denies hitting her head or LOC.  She has not taken anything for pain prior to arrival.  She is right handed.  Patient is a 65 y.o. female presenting with shoulder injury. The history is provided by the patient.  Shoulder Injury    Past Medical History  Diagnosis Date  . Mitral valve prolapse     responded to beta-blockers. no evidence of this on 2-d echo  . History of chest pain   . Palpitations    Past Surgical History  Procedure Laterality Date  . Cholecystectomy  1997  . Left shoulder acrominoplasty    . Colonoscopy  11/02/2007    normal   Family History  Problem Relation Age of Onset  . Diabetes      parent  . Hypertension      parent  . Hemochromatosis Brother   . Diabetes Mother   . Cancer Mother   . Multiple myeloma Mother    Social History  Substance Use Topics  . Smoking status: Never Smoker   . Smokeless tobacco: None  . Alcohol Use: No   OB History    No data available     Review of Systems  All other systems reviewed and are negative.     Allergies  Amoxicillin; Ampicillin; Cephalexin; Meperidine hcl; Ofloxacin; and Sulfamethoxazole-trimethoprim  Home Medications   Prior to Admission medications   Medication Sig Start Date End Date Taking? Authorizing Provider  ASPIR-LOW 81 MG EC tablet TAKE 1 TABLET BY MOUTH DAILY 04/25/16    Hoyt Koch, MD  b complex vitamins tablet Take 1 tablet by mouth daily. 01/18/16   Hoyt Koch, MD  Calcium Carbonate-Vitamin D (CALCIUM-D) 600-400 MG-UNIT TABS Take one by mouth daily 01/18/16   Hoyt Koch, MD  metoprolol succinate (TOPROL-XL) 25 MG 24 hr tablet Take 1 tablet (25 mg total) by mouth daily. 04/24/16   Hoyt Koch, MD  Omega-3 Fatty Acids (FISH OIL) 1000 MG CAPS Take 1 capsule (1,000 mg total) by mouth daily. 01/18/16   Hoyt Koch, MD   BP 112/77 mmHg  Pulse 61  Temp(Src) 97.8 F (36.6 C) (Oral)  Resp 20  Wt 83.462 kg  SpO2 100% Physical Exam  Constitutional: She appears well-developed and well-nourished.  HENT:  Head: Normocephalic and atraumatic.  Neck: Normal range of motion. Neck supple.  Cardiovascular: Normal rate, regular rhythm and normal heart sounds.   Pulses:      Radial pulses are 2+ on the right side, and 2+ on the left side.  Pulmonary/Chest: Effort normal and breath sounds normal.  Musculoskeletal:       Left elbow: She exhibits normal range of motion, no swelling, no effusion and no deformity. No tenderness found.       Left upper arm: She exhibits tenderness and bony tenderness. She exhibits no  swelling, no edema and no deformity.  Patient unable to move the left shoulder No obvious deformity   Neurological: She is alert.  Distal sensation of left hand intact  Skin: Skin is warm and dry.  Psychiatric: She has a normal mood and affect.  Nursing note and vitals reviewed.   ED Course  Procedures (including critical care time) Labs Review Labs Reviewed - No data to display  Imaging Review Dg Shoulder 1v Left  06/14/2016  CLINICAL DATA:  Left shoulder pain/injury, fell on left shoulder EXAM: LEFT SHOULDER - 1 VIEW COMPARISON:  None. FINDINGS: Two views of the left shoulder submitted. There is cortical irregularity in the region of greater tuberosity highly suspicious for nondisplaced fracture. Clinical  correlation is necessary. IMPRESSION: There is cortical irregularity in region of greater tuberosity highly suspicious for fracture. Electronically Signed   By: Lahoma Crocker M.D.   On: 06/14/2016 16:39   Dg Humerus Left  06/14/2016  CLINICAL DATA:  Recent fall with left shoulder pain, initial encounter EXAM: LEFT HUMERUS - 1 VIEW COMPARISON:  None. FINDINGS: Irregularity of the proximal humerus as likely related to greater tuberosity fracture. There is also linear density superimposed over the scapula which may be related to a scapular fracture. CT may be helpful for further evaluation. IMPRESSION: Changes suggestive of proximal humerus and scapular fractures. CT may be helpful for further evaluation. Electronically Signed   By: Inez Catalina M.D.   On: 06/14/2016 16:33   I have personally reviewed and evaluated these images and lab results as part of my medical decision-making.   EKG Interpretation None      MDM   Final diagnoses:  Left arm pain   Patient presents today with pain of the left shoulder that has been present since a mechanical fall that occurred just prior to arrival.  She denies hitting her head or LOC when falling.  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  Xray results as above showing a fracture of the greater tuberosity.  Fracture is closed.  She is neurovascularly intact.  Dr Veverly Fells with Orthopedics also evaluated the patient in the ED and reviewed the xray.  He recommends sling and follow up with him in the office in one week.  Pain controlled at time of discharge.  Patient discharged home with Rx for Percocet and Robaxin.  Return precautions given.    Hyman Bible, PA-C 06/14/16 1653  Milton Ferguson, MD 06/14/16 [phone]

## 2016-06-14 NOTE — ED Notes (Signed)
Patient transported to X-ray 

## 2016-07-11 ENCOUNTER — Other Ambulatory Visit (HOSPITAL_COMMUNITY): Payer: Self-pay | Admitting: Orthopedic Surgery

## 2016-07-11 DIAGNOSIS — M25512 Pain in left shoulder: Secondary | ICD-10-CM

## 2016-07-11 DIAGNOSIS — M75102 Unspecified rotator cuff tear or rupture of left shoulder, not specified as traumatic: Secondary | ICD-10-CM

## 2016-07-12 ENCOUNTER — Ambulatory Visit (HOSPITAL_COMMUNITY)
Admission: RE | Admit: 2016-07-12 | Discharge: 2016-07-12 | Disposition: A | Discharge status: Home / Self Care | Payer: PRIVATE HEALTH INSURANCE | Source: Ambulatory Visit | Attending: Orthopedic Surgery | Admitting: Orthopedic Surgery

## 2016-07-12 ENCOUNTER — Ambulatory Visit (HOSPITAL_COMMUNITY): Admission: RE | Admit: 2016-07-12 | Payer: 59 | Source: Ambulatory Visit

## 2016-07-12 DIAGNOSIS — X58XXXD Exposure to other specified factors, subsequent encounter: Secondary | ICD-10-CM | POA: Diagnosis not present

## 2016-07-12 DIAGNOSIS — M19012 Primary osteoarthritis, left shoulder: Secondary | ICD-10-CM | POA: Insufficient documentation

## 2016-07-12 DIAGNOSIS — S42253D Displaced fracture of greater tuberosity of unspecified humerus, subsequent encounter for fracture with routine healing: Secondary | ICD-10-CM | POA: Diagnosis not present

## 2016-07-12 DIAGNOSIS — M75102 Unspecified rotator cuff tear or rupture of left shoulder, not specified as traumatic: Secondary | ICD-10-CM

## 2016-07-12 DIAGNOSIS — M25512 Pain in left shoulder: Secondary | ICD-10-CM

## 2016-07-12 DIAGNOSIS — Z9889 Other specified postprocedural states: Secondary | ICD-10-CM | POA: Diagnosis not present

## 2016-07-12 DIAGNOSIS — M7582 Other shoulder lesions, left shoulder: Secondary | ICD-10-CM | POA: Diagnosis not present

## 2016-07-17 ENCOUNTER — Other Ambulatory Visit (HOSPITAL_COMMUNITY): Payer: Self-pay | Admitting: Orthopedic Surgery

## 2016-07-17 DIAGNOSIS — S4992XA Unspecified injury of left shoulder and upper arm, initial encounter: Secondary | ICD-10-CM

## 2016-07-31 ENCOUNTER — Ambulatory Visit: Payer: PRIVATE HEALTH INSURANCE | Attending: Orthopedic Surgery

## 2016-07-31 DIAGNOSIS — X58XXXA Exposure to other specified factors, initial encounter: Secondary | ICD-10-CM | POA: Insufficient documentation

## 2016-07-31 DIAGNOSIS — R252 Cramp and spasm: Secondary | ICD-10-CM | POA: Diagnosis present

## 2016-07-31 DIAGNOSIS — M25512 Pain in left shoulder: Secondary | ICD-10-CM

## 2016-07-31 DIAGNOSIS — S42202A Unspecified fracture of upper end of left humerus, initial encounter for closed fracture: Secondary | ICD-10-CM

## 2016-07-31 DIAGNOSIS — M25612 Stiffness of left shoulder, not elsewhere classified: Secondary | ICD-10-CM

## 2016-07-31 NOTE — Therapy (Signed)
[facility] [address] [place], [place], [address] Phone: [phone]   Fax:  [phone]  Physical Therapy Evaluation  Patient Details  Name: Kim Anderson MRN: [id number] Date of Birth: 1950-12-21 Referring Provider: Malon Kindle, MD  Encounter Date: 07/31/2016      PT End of Session - 07/31/16 1034    Visit Number 1   Number of Visits 24   Date for PT Re-Evaluation 10/25/16   Authorization Type Workers comp   PT Start Time 0830   PT Stop Time 0924   PT Time Calculation (min) 54 min   Activity Tolerance Patient tolerated treatment well   Behavior During Therapy [facility] for tasks assessed/performed      Past Medical History:  Diagnosis Date  . History of chest pain   . Mitral valve prolapse    responded to beta-blockers. no evidence of this on 2-d echo  . Palpitations     Past Surgical History:  Procedure Laterality Date  . CHOLECYSTECTOMY  1997  . COLONOSCOPY  11/02/2007   normal  . LEFT SHOULDER ACROMINOPLASTY      There were no vitals filed for this visit.       Subjective Assessment - 07/31/16 0857    Subjective Mrs Cronce reports fall at work with fracture of LT humerous. She continues to work light duty.  No lifting,  no use of LT arm.  Isometrics and active asssit only   Pertinent History LT shoulder acromioplasty   Limitations Lifting;House hold activities  light duty at work   Diagnostic tests Xray   Patient Stated Goals Return to normal use of LT arm and return to full duty work and home tasks   Currently in Pain? Yes   Pain Score 3    Pain Location Shoulder   Pain Orientation Left   Pain Descriptors / Indicators --  just hurts   Pain Type Acute pain   Pain Onset More than a month ago   Pain Frequency Constant   Aggravating Factors  using arm,    Pain Relieving Factors massage, ice , medication   Multiple Pain Sites Yes  pain in upper back             [facility] PT Assessment - 07/31/16  0852      Assessment   Medical Diagnosis Lt proximal humerous fracture , greater tuberosity   Referring Provider Malon Kindle, MD   Onset Date/Surgical Date 06/14/16   Hand Dominance Right   Next MD Visit 08/25/16   Prior Therapy pendulums and elbow flex and extend     Precautions   Precaution Comments No lifting arm into flex /ext/abduction/ rotation   Required Braces or Orthoses Sling  when at rest sling off arm supported     Restrictions   Weight Bearing Restrictions Yes     Balance Screen   Has the patient fallen in the past 6 months Yes   How many times? 1  at work   Has the patient had a decrease in activity level because of a fear of falling?  No   Is the patient reluctant to leave their home because of a fear of falling?  No     Prior Function   Level of Independence Needs assistance with ADLs;Needs assistance with homemaking     Cognition   Overall Cognitive Status Within Functional Limits for tasks assessed     Observation/Other Assessments   Focus on Therapeutic Outcomes (FOTO)  53% limited  ROM / Strength   AROM / PROM / Strength PROM     PROM   PROM Assessment Site Shoulder   Right/Left Shoulder Right;Left   Right Shoulder Flexion 162 Degrees   Right Shoulder ABduction 158 Degrees   Right Shoulder Internal Rotation 55 Degrees   Right Shoulder External Rotation 92 Degrees   Right Shoulder Horizontal ABduction 30 Degrees   Right Shoulder Horizontal  ADduction 115 Degrees   Left Shoulder Flexion 60 Degrees   Left Shoulder ABduction 90 Degrees   Left Shoulder Internal Rotation 60 Degrees   Left Shoulder External Rotation 60 Degrees   Left Shoulder Horizontal ABduction 15 Degrees   Left Shoulder Horizontal ADduction 75 Degrees                           PT Education - 07/31/16 1117    Education provided Yes   Education Details POC ,isometrics   Person(s) Educated Patient   Methods Explanation;Demonstration;Tactile cues;Verbal  cues;Handout   Comprehension Returned demonstration;Verbalized understanding          PT Short Term Goals - 07/31/16 1025      PT SHORT TERM GOAL #1   Title She will be independent with inital HEP   Time 3     PT SHORT TERM GOAL #2   Title She will limprove active asssited flexion to 120 degrees    Time 4   Period Weeks   Status New     PT SHORT TERM GOAL #3   Title She will improve acti assitive abduciton to 110 degrees    Time 4   Period Weeks   Status New     PT SHORT TERM GOAL #4   Title she will improve acti assist ER to 85 degrees   Time 4   Period Weeks   Status New     PT SHORT TERM GOAL #5   Title she will report pain improved with ROM and normal activity by 30% or more   Time 4   Period Weeks   Status New           PT Long Term Goals - 07/31/16 1027      PT LONG TERM GOAL #1   Title she will be independnet with all HEP issued   Time 12   Period Weeks   Status New     PT LONG TERM GOAL #2   Title She will return to work normla duties    Time 12   Period Weeks   Status New     PT LONG TERM GOAL #3   Title She will improve so able to dress without asssit.    Time 12   Period Weeks   Status New     PT LONG TERM GOAL #4   Title She will have 1-2/10 max pain with all activity   Time 12   Period Weeks   Status New     PT LONG TERM GOAL #5   Title She will be able to ligft 30 pounds from floor and to shoulder height with 2 arms to demo improved functinal strength   Time 12   Period Weeks   Status New               Plan - 07/31/16 1041    Clinical Impression Statement Mrs Dossie ArbourYontz presents for moderate complexity eval post fall and fracture and is limited by pain nad  stiffness. strength not  tested as Instructions for AAROm only and isometrics. She also has spasm in upper back medical to scapula that is worse with walking distances   Rehab Potential Good   PT Frequency 2x / week   PT Duration 12 weeks   PT Treatment/Interventions  Cryotherapy;Electrical Stimulation;Iontophoresis 4mg /ml Dexamethasone;Moist Heat;Ultrasound;Patient/family education;Manual techniques;Therapeutic exercise;Dry needling;Taping  NO ACTIVE , NO PASSIVE   PT Next Visit Plan MANUAL NAD MODALITIES UPPER BACK SPASMS, AAROM lt SHOULDER , REVIEW ISOMETRICS . MODALITIES   PT Home Exercise Plan ISOMETRICS, cold   Consulted and Agree with Plan of Care Patient      Patient will benefit from skilled therapeutic intervention in order to improve the following deficits and impairments:  Decreased range of motion, Pain, Decreased strength, Decreased activity tolerance, Impaired UE functional use, Increased muscle spasms  Visit Diagnosis: Pain in left shoulder - Plan: PT plan of care cert/re-cert  Stiffness of left shoulder, not elsewhere classified - Plan: PT plan of care cert/re-cert  Cramp and spasm - Plan: PT plan of care cert/re-cert  Proximal humeral fracture, left, closed, initial encounter - Plan: PT plan of care cert/re-cert     Problem List Patient Active Problem List   Diagnosis Date Noted  . Routine general medical examination at a health care facility 04/10/2015  . PALPITATIONS 05/30/2009    Caprice Red  PT 07/31/2016, 11:53 AM  [facility] [address] [place], [place], [address] Phone: [phone]   Fax:  [phone]  Name: SHALAN NEAULT MRN: [id number] Date of Birth: [birth year]

## 2016-07-31 NOTE — Patient Instructions (Signed)
Strengthening: Isometric Flexion  Using wall for resistance, press right fist into ball using light pressure. Hold ____ seconds. Repeat ____ times per set. Do ____ sets per session. Do ____ sessions per day.  SHOULDER: Abduction (Isometric)  Use wall as resistance. Press arm against pillow. Keep elbow straight. Hold ___ seconds. ___ reps per set, ___ sets per day, ___ days per week  Extension (Isometric)  Place left bent elbow and back of arm against wall. Press elbow against wall. Hold ____ seconds. Repeat ____ times. Do ____ sessions per day.  Internal Rotation (Isometric)  Place palm of right fist against door frame, with elbow bent. Press fist against door frame. Hold ____ seconds. Repeat ____ times. Do ____ sessions per day.  External Rotation (Isometric)  Place back of left fist against door frame, with elbow bent. Press fist against door frame. Hold ____ seconds. Repeat ____ times. Do ____ sessions per day.  Copyright  VHI. All rights reserved.   All 1x/day 10-15 reps 5-10 sec hold   No pain

## 2016-08-02 ENCOUNTER — Ambulatory Visit: Payer: PRIVATE HEALTH INSURANCE | Attending: Internal Medicine

## 2016-08-02 DIAGNOSIS — X58XXXA Exposure to other specified factors, initial encounter: Secondary | ICD-10-CM | POA: Diagnosis not present

## 2016-08-02 DIAGNOSIS — M25512 Pain in left shoulder: Secondary | ICD-10-CM | POA: Insufficient documentation

## 2016-08-02 DIAGNOSIS — M25612 Stiffness of left shoulder, not elsewhere classified: Secondary | ICD-10-CM | POA: Diagnosis not present

## 2016-08-02 DIAGNOSIS — R252 Cramp and spasm: Secondary | ICD-10-CM | POA: Diagnosis not present

## 2016-08-02 DIAGNOSIS — S42202A Unspecified fracture of upper end of left humerus, initial encounter for closed fracture: Secondary | ICD-10-CM | POA: Insufficient documentation

## 2016-08-02 NOTE — Patient Instructions (Signed)
No pain with isometric and ease off if you "feel it".

## 2016-08-02 NOTE — Therapy (Signed)
[facility] [address] [place], [place], [address] Phone: [phone]   Fax:  [phone]  Physical Therapy Treatment  Patient Details  Name: Kim Anderson MRN: [id number] Date of Birth: 10-26-1951 Referring Provider: Malon Kindle, MD  Encounter Date: 08/02/2016      PT End of Session - 08/02/16 0843    Visit Number 2   Number of Visits 24   Date for PT Re-Evaluation 10/25/16   Authorization Type Workers comp   PT Start Time 0800   PT Stop Time 0840   PT Time Calculation (min) 40 min   Activity Tolerance Patient tolerated treatment well;No increased pain   Behavior During Therapy WFL for tasks assessed/performed      Past Medical History:  Diagnosis Date  . History of chest pain   . Mitral valve prolapse    responded to beta-blockers. no evidence of this on 2-d echo  . Palpitations     Past Surgical History:  Procedure Laterality Date  . CHOLECYSTECTOMY  1997  . COLONOSCOPY  11/02/2007   normal  . LEFT SHOULDER ACROMINOPLASTY      There were no vitals filed for this visit.      Subjective Assessment - 08/02/16 0804    Subjective (P)  Shoulder more tired unless I move it  then it hurts   Currently in Pain? (P)  Yes   Pain Score (P)  3    Pain Location (P)  Shoulder   Pain Orientation (P)  Left   Pain Descriptors / Indicators (P)  --  aware of it   Pain Type (P)  Acute pain   Pain Onset (P)  More than a month ago   Pain Frequency (P)  Constant   Aggravating Factors  (P)  using    Pain Relieving Factors (P)  Massage , medication ice   Multiple Pain Sites (P)  Yes  upper back                         [facility] Adult PT Treatment/Exercise - 08/02/16 0838      Shoulder Exercises: Isometric Strengthening   Flexion Supine  8 x 5 sec   Extension Supine   Extension Limitations 8 x10   External Rotation Supine   External Rotation Limitations 8x10 sec   Internal Rotation Supine   Internal  Rotation Limitations x10 sec   ABduction Supine   ABduction Limitations 8x10 sec   ADduction Supine  8x10 sec   ADduction Limitations cued to have no pain  for all above      Manual Therapy   Joint Mobilization gentle Gr 2  distraction and inferior and posterior glides , no pain   Soft tissue mobilization to axilla include pectoral and sub scap   Other Manual Therapy actoive assisted ROM to tolerance no pain x 10  all planes                PT Education - 08/02/16 0843    Education provided Yes   Education Details caution to have not pain or strain with isometrics   Person(s) Educated Patient   Methods Explanation   Comprehension Verbalized understanding          PT Short Term Goals - 08/02/16 0844      PT SHORT TERM GOAL #1   Title She will be independent with inital HEP   Status Achieved     PT SHORT TERM GOAL #2  Title She will limprove active asssited flexion to 120 degrees    Status On-going     PT SHORT TERM GOAL #3   Title She will improve acti assitive abduciton to 110 degrees    Status On-going     PT SHORT TERM GOAL #4   Title she will improve acti assist ER to 85 degrees   Status On-going     PT SHORT TERM GOAL #5   Title she will report pain improved with ROM and normal activity by 30% or more   Status On-going           PT Long Term Goals - 07/31/16 1027      PT LONG TERM GOAL #1   Title she will be independnet with all HEP issued   Time 12   Period Weeks   Status New     PT LONG TERM GOAL #2   Title She will return to work normla duties    Time 12   Period Weeks   Status New     PT LONG TERM GOAL #3   Title She will improve so able to dress without asssit.    Time 12   Period Weeks   Status New     PT LONG TERM GOAL #4   Title She will have 1-2/10 max pain with all activity   Time 12   Period Weeks   Status New     PT LONG TERM GOAL #5   Title She will be able to ligft 30 pounds from floor and to shoulder height with  2 arms to demo improved functinal strength   Time 12   Period Weeks   Status New               Plan - 08/02/16 0844    Clinical Impression Statement Range was smoother today and range was incr with AAROM stopping at point of strain start of pain.    PT Treatment/Interventions Cryotherapy;Electrical Stimulation;Iontophoresis 4mg /ml Dexamethasone;Moist Heat;Ultrasound;Patient/family education;Manual techniques;Therapeutic exercise;Dry needling;Taping   PT Next Visit Plan MANUAL NAD MODALITIES UPPER BACK SPASMS, AAROM lt SHOULDER , REVIEW ISOMETRICS . MODALITIES   Consulted and Agree with Plan of Care Patient      Patient will benefit from skilled therapeutic intervention in order to improve the following deficits and impairments:  Decreased range of motion, Pain, Decreased strength, Decreased activity tolerance, Impaired UE functional use, Increased muscle spasms  Visit Diagnosis: Pain in left shoulder  Stiffness of left shoulder, not elsewhere classified  Cramp and spasm  Proximal humeral fracture, left, closed, initial encounter     Problem List Patient Active Problem List   Diagnosis Date Noted  . Routine general medical examination at a health care facility 04/10/2015  . PALPITATIONS 05/30/2009    Kim Anderson, Kim Anderson   PT 08/02/2016, 8:45 AM  [facility] [address] [place], [place], [address] Phone: [phone]   Fax:  [phone]  Name: Kim SkiffLaura S Anderson MRN: [id number] Date of Birth: 10/30/1951

## 2016-08-06 MED FILL — ASPIR-LOW EC 81 MG TABLET: 81 | 90 days supply | Qty: 90 | Fill #1

## 2016-08-06 MED FILL — METOPROLOL SUCC ER 25 MG TA: 25 | 90 days supply | Qty: 90 | Fill #1

## 2016-08-07 ENCOUNTER — Ambulatory Visit: Payer: PRIVATE HEALTH INSURANCE | Admitting: Physical Therapy

## 2016-08-07 DIAGNOSIS — S42202A Unspecified fracture of upper end of left humerus, initial encounter for closed fracture: Secondary | ICD-10-CM

## 2016-08-07 DIAGNOSIS — M25512 Pain in left shoulder: Secondary | ICD-10-CM

## 2016-08-07 DIAGNOSIS — R252 Cramp and spasm: Secondary | ICD-10-CM

## 2016-08-07 DIAGNOSIS — M25612 Stiffness of left shoulder, not elsewhere classified: Secondary | ICD-10-CM | POA: Diagnosis not present

## 2016-08-07 NOTE — Therapy (Signed)
[facility] Outpatient Rehabilitation [facility] [address] [place], [place], [address] Phone: [phone]   Fax:  [phone]  Physical Therapy Treatment  Patient Details  Name: Kim Anderson MRN: [id number] Date of Birth: 05/10/1951 Referring Provider: Malon Kindle, MD  Encounter Date: 08/07/2016      PT End of Session - 08/07/16 1313    Visit Number 3   Number of Visits 24   Date for PT Re-Evaluation 10/25/16   PT Start Time 0804   PT Stop Time 0904   PT Time Calculation (min) 60 min   Activity Tolerance Patient tolerated treatment well   Behavior During Therapy [facility] for tasks assessed/performed      Past Medical History:  Diagnosis Date  . History of chest pain   . Mitral valve prolapse    responded to beta-blockers. no evidence of this on 2-d echo  . Palpitations     Past Surgical History:  Procedure Laterality Date  . CHOLECYSTECTOMY  1997  . COLONOSCOPY  11/02/2007   normal  . LEFT SHOULDER ACROMINOPLASTY      There were no vitals filed for this visit.      Subjective Assessment - 08/07/16 0808    Subjective No pain with sitting still.     Currently in Pain? Yes   Pain Location Shoulder   Pain Orientation Left   Pain Descriptors / Indicators Sharp;Heaviness  tired   Pain Frequency Constant   Aggravating Factors  using arm transition movements   Pain Relieving Factors muscle relaer,  ice,  heat,  sling   Multiple Pain Sites --  low back,  twingews to 3-4 with transition,  spasm,  catches                         [facility] Adult PT Treatment/Exercise - 08/07/16 0001      Shoulder Exercises: Seated   Elevation Limitations shrugs 10 X, depressions 10 X circles 10 Xsingle and both shoulders.   Retraction 10 reps     Shoulder Exercises: Isometric Strengthening   Flexion --  8 X 10 sitting,  twingse with all isometrics.    Extension --  8 X 10, sitting   Extension Limitations 8 X10, sitting   External Rotation  Limitations 8 X 10   Internal Rotation --  sitting   ABduction --  8 X 10, sitting   ABduction Limitations 8x10 sec   ADduction Other (comment)   ADduction Limitations sitting     Moist Heat Therapy   Number Minutes Moist Heat 15 Minutes   Moist Heat Location Shoulder;Lumbar Spine     Electrical Stimulation   Electrical Stimulation Location shoulder   Electrical Stimulation Action IFC   Electrical Stimulation Parameters to tolerance   Electrical Stimulation Goals Pain     Manual Therapy   Manual Therapy Soft tissue mobilization   Manual therapy comments patient guarding,  able to soften nrck, upper trap,  fascia.  t   Other Manual Therapy Passive ROM  guarding.                   PT Short Term Goals - 08/07/16 1318      PT SHORT TERM GOAL #1   Title She will be independent with inital HEP   Status Achieved     PT SHORT TERM GOAL #2   Title She will limprove active asssited flexion to 120 degrees    Baseline visually: less than 80  Time 4   Period Weeks   Status On-going     PT SHORT TERM GOAL #3   Title She will improve acti assitive abduciton to 110 degrees    Time 4   Period Weeks   Status On-going     PT SHORT TERM GOAL #4   Title she will improve acti assist ER to 85 degrees   Time 4   Period Weeks   Status On-going     PT SHORT TERM GOAL #5   Title she will report pain improved with ROM and normal activity by 30% or more   Time 4   Period Weeks   Status On-going           PT Long Term Goals - 07/31/16 1027      PT LONG TERM GOAL #1   Title she will be independnet with all HEP issued   Time 12   Period Weeks   Status New     PT LONG TERM GOAL #2   Title She will return to work normla duties    Time 12   Period Weeks   Status New     PT LONG TERM GOAL #3   Title She will improve so able to dress without asssit.    Time 12   Period Weeks   Status New     PT LONG TERM GOAL #4   Title She will have 1-2/10 max pain with all  activity   Time 12   Period Weeks   Status New     PT LONG TERM GOAL #5   Title She will be able to ligft 30 pounds from floor and to shoulder height with 2 arms to demo improved functinal strength   Time 12   Period Weeks   Status New               Plan - 08/07/16 1313    Clinical Impression Statement Guarding with ROM today, ?New PTA?  Home exercises peformed with correct technique.  Less upper back pain with session.     PT Next Visit Plan check with patient during modalities to see if IFC needs increasing. more manual, ue ranger? pulleys, Cane?   PT Home Exercise Plan continue   Consulted and Agree with Plan of Care Patient      Patient will benefit from skilled therapeutic intervention in order to improve the following deficits and impairments:     Visit Diagnosis: Pain in left shoulder  Stiffness of left shoulder, not elsewhere classified  Cramp and spasm  Proximal humeral fracture, left, closed, initial encounter     Problem List Patient Active Problem List   Diagnosis Date Noted  . Routine general medical examination at a health care facility 04/10/2015  . PALPITATIONS 05/30/2009    Akashdeep Chuba 08/07/2016, 1:21 PM  [facility] [address] [place], [place], [address] Phone: [phone]   Fax:  [phone]  Name: Kim Anderson MRN: [id number] Date of Birth: 09/26/1951   Liz BeachKaren Gregoria Selvy, PTA 08/07/16 1:21 PM Phone: [phone] Fax: [phone]

## 2016-08-09 ENCOUNTER — Ambulatory Visit: Payer: PRIVATE HEALTH INSURANCE | Admitting: Physical Therapy

## 2016-08-09 DIAGNOSIS — M25612 Stiffness of left shoulder, not elsewhere classified: Secondary | ICD-10-CM | POA: Diagnosis not present

## 2016-08-09 DIAGNOSIS — S42202A Unspecified fracture of upper end of left humerus, initial encounter for closed fracture: Secondary | ICD-10-CM

## 2016-08-09 DIAGNOSIS — M25512 Pain in left shoulder: Secondary | ICD-10-CM | POA: Diagnosis not present

## 2016-08-09 DIAGNOSIS — R252 Cramp and spasm: Secondary | ICD-10-CM

## 2016-08-09 NOTE — Therapy (Signed)
[facility] Outpatient Rehabilitation [facility] [address] [place], [place], [address] Phone: [phone]   Fax:  [phone]  Physical Therapy Treatment  Patient Details  Name: Kim Anderson MRN: [id number] Date of Birth: January 28, 1951 Referring Provider: Malon Kindle, MD  Encounter Date: 08/09/2016      PT End of Session - 08/09/16 1200    Visit Number 4   Number of Visits 24   Date for PT Re-Evaluation 10/25/16   PT Start Time 1100   PT Stop Time 1204   PT Time Calculation (min) 64 min   Activity Tolerance Patient tolerated treatment well   Behavior During Therapy [facility] for tasks assessed/performed      Past Medical History:  Diagnosis Date  . History of chest pain   . Mitral valve prolapse    responded to beta-blockers. no evidence of this on 2-d echo  . Palpitations     Past Surgical History:  Procedure Laterality Date  . CHOLECYSTECTOMY  1997  . COLONOSCOPY  11/02/2007   normal  . LEFT SHOULDER ACROMINOPLASTY      There were no vitals filed for this visit.      Subjective Assessment - 08/09/16 1104    Subjective "shoulder is doing okay, but my back is the things is that is causing more pain"    Currently in Pain? Yes   Pain Orientation Left                         [facility] Adult PT Treatment/Exercise - 08/09/16 0001      Shoulder Exercises: Seated   Retraction 10 reps     Shoulder Exercises: Pulleys   Flexion 3 minutes  105 during flexion     Moist Heat Therapy   Number Minutes Moist Heat 15 Minutes   Moist Heat Location Shoulder     Electrical Stimulation   Electrical Stimulation Location shoulder   Electrical Stimulation Action IFC   Electrical Stimulation Parameters 15 min, L13 100% scan   Electrical Stimulation Goals Pain     Manual Therapy   Manual Therapy Passive ROM   Manual therapy comments manual trigger point release x 3 along L upper trap   Joint Mobilization gentle Gr 2  distraction and inferior and  posterior glides , no pain   Soft tissue mobilization IASTM over L upper trap and levator scapulae   Passive ROM gentl flexion/ abduction/ and ER with oscillations to reduce pain gradually working into further motion                  PT Short Term Goals - 08/07/16 1318      PT SHORT TERM GOAL #1   Title She will be independent with inital HEP   Status Achieved     PT SHORT TERM GOAL #2   Title She will limprove active asssited flexion to 120 degrees    Baseline visually: less than 80   Time 4   Period Weeks   Status On-going     PT SHORT TERM GOAL #3   Title She will improve acti assitive abduciton to 110 degrees    Time 4   Period Weeks   Status On-going     PT SHORT TERM GOAL #4   Title she will improve acti assist ER to 85 degrees   Time 4   Period Weeks   Status On-going     PT SHORT TERM GOAL #5   Title she will  report pain improved with ROM and normal activity by 30% or more   Time 4   Period Weeks   Status On-going           PT Long Term Goals - 07/31/16 1027      PT LONG TERM GOAL #1   Title she will be independnet with all HEP issued   Time 12   Period Weeks   Status New     PT LONG TERM GOAL #2   Title She will return to work normla duties    Time 12   Period Weeks   Status New     PT LONG TERM GOAL #3   Title She will improve so able to dress without asssit.    Time 12   Period Weeks   Status New     PT LONG TERM GOAL #4   Title She will have 1-2/10 max pain with all activity   Time 12   Period Weeks   Status New     PT LONG TERM GOAL #5   Title She will be able to ligft 30 pounds from floor and to shoulder height with 2 arms to demo improved functinal strength   Time 12   Period Weeks   Status New               Plan - 08/09/16 1200    Clinical Impression Statement Mrs. Kim Anderson demonstrates improvement with AAROM flexion to 105 with pulleys. pt continues to demonstrate significant guarding limiting shoulder PROM.  continued E-stim with MHP post session.    PT Next Visit Plan continue with Pulleys, modalities PRN for pain post session, mobs, IASTM upper trap tightness.    Consulted and Agree with Plan of Care Patient      Patient will benefit from skilled therapeutic intervention in order to improve the following deficits and impairments:  Decreased range of motion, Pain, Decreased strength, Decreased activity tolerance, Impaired UE functional use, Increased muscle spasms  Visit Diagnosis: Pain in left shoulder  Stiffness of left shoulder, not elsewhere classified  Cramp and spasm  Proximal humeral fracture, left, closed, initial encounter     Problem List Patient Active Problem List   Diagnosis Date Noted  . Routine general medical examination at a health care facility 04/10/2015  . PALPITATIONS 05/30/2009   Kim Anderson PT, DPT, LAT, ATC  08/09/16  12:14 PM      [facility] [address] [place], [place], [address] Phone: [phone]   Fax:  [phone]  Name: Kim Anderson MRN: [id number] Date of Birth: 07/11/1951

## 2016-08-12 ENCOUNTER — Ambulatory Visit: Payer: PRIVATE HEALTH INSURANCE

## 2016-08-12 DIAGNOSIS — S42202A Unspecified fracture of upper end of left humerus, initial encounter for closed fracture: Secondary | ICD-10-CM

## 2016-08-12 DIAGNOSIS — R252 Cramp and spasm: Secondary | ICD-10-CM | POA: Diagnosis not present

## 2016-08-12 DIAGNOSIS — M25512 Pain in left shoulder: Secondary | ICD-10-CM | POA: Diagnosis not present

## 2016-08-12 DIAGNOSIS — M25612 Stiffness of left shoulder, not elsewhere classified: Secondary | ICD-10-CM | POA: Diagnosis not present

## 2016-08-12 NOTE — Therapy (Signed)
[facility] Outpatient Rehabilitation [facility] [address] [place], [place], [address] Phone: [phone]   Fax:  [phone]  Physical Therapy Treatment  Patient Details  Name: Kim Anderson MRN: [id number] Date of Birth: 1951/02/04 Referring Provider: Malon Kindle, MD  Encounter Date: 08/12/2016      PT End of Session - 08/12/16 0844    Number of Visits 24   Date for PT Re-Evaluation 10/25/16   Authorization Type Workers comp   PT Start Time 0845   PT Stop Time 0925   PT Time Calculation (min) 40 min   Activity Tolerance Patient tolerated treatment well   Behavior During Therapy [facility] for tasks assessed/performed      Past Medical History:  Diagnosis Date  . History of chest pain   . Mitral valve prolapse    responded to beta-blockers. no evidence of this on 2-d echo  . Palpitations     Past Surgical History:  Procedure Laterality Date  . CHOLECYSTECTOMY  1997  . COLONOSCOPY  11/02/2007   normal  . LEFT SHOULDER ACROMINOPLASTY      There were no vitals filed for this visit.          [facility] PT Assessment - 08/12/16 0001      PROM   Left Shoulder Flexion 108 Degrees  active asssit at [facility] Adult PT Treatment/Exercise - 08/12/16 0001      Shoulder Exercises: Pulleys   Flexion 3 minutes     Shoulder Exercises: Isometric Strengthening   Flexion Supine  8x10   Extension Supine  8x10   External Rotation Supine   Theraband Level (External Rotation) --  8x10   Internal Rotation Supine  8x10   ABduction Supine  8x10   ADduction Supine  8x10     Moist Heat Therapy   Number Minutes Moist Heat 35 Minutes   Moist Heat Location Lumbar Spine  during treatment in supine     Electrical Stimulation   Electrical Stimulation Location --   Electrical Stimulation Action --   Statistician Goals --                  PT Short Term Goals - 08/07/16 1318      PT SHORT TERM GOAL #1    Title She will be independent with inital HEP   Status Achieved     PT SHORT TERM GOAL #2   Title She will limprove active asssited flexion to 120 degrees    Baseline visually: less than 80   Time 4   Period Weeks   Status On-going     PT SHORT TERM GOAL #3   Title She will improve acti assitive abduciton to 110 degrees    Time 4   Period Weeks   Status On-going     PT SHORT TERM GOAL #4   Title she will improve acti assist ER to 85 degrees   Time 4   Period Weeks   Status On-going     PT SHORT TERM GOAL #5   Title she will report pain improved with ROM and normal activity by 30% or more   Time 4   Period Weeks   Status On-going           PT Long Term Goals - 07/31/16 1027      PT LONG TERM GOAL #1   Title she  will be independnet with all HEP issued   Time 12   Period Weeks   Status New     PT LONG TERM GOAL #2   Title She will return to work normla duties    Time 12   Period Weeks   Status New     PT LONG TERM GOAL #3   Title She will improve so able to dress without asssit.    Time 12   Period Weeks   Status New     PT LONG TERM GOAL #4   Title She will have 1-2/10 max pain with all activity   Time 12   Period Weeks   Status New     PT LONG TERM GOAL #5   Title She will be able to ligft 30 pounds from floor and to shoulder height with 2 arms to demo improved functinal strength   Time 12   Period Weeks   Status New               Plan - 08/12/16 [id number]    Clinical Impression Statement ROM improving but continues to have sharp pains at end range. LBP is limiting her activity also so will use modalities as helpful. still needs minor cues to ease off with discomfort with isometrics but no pain  post session declined modalities for shoulder   PT Treatment/Interventions Cryotherapy;Electrical Stimulation;Iontophoresis 4mg /ml Dexamethasone;Moist Heat;Ultrasound;Patient/family education;Manual techniques;Therapeutic exercise;Dry needling;Taping    PT Next Visit Plan continue with Pulleys, modalities PRN for pain post session, mobs, IASTM upper trap tightness.  Add post pelvic tilt for LBP, Cont AAROM  and isometric    Consulted and Agree with Plan of Care Patient      Patient will benefit from skilled therapeutic intervention in order to improve the following deficits and impairments:  Decreased range of motion, Pain, Decreased strength, Decreased activity tolerance, Impaired UE functional use, Increased muscle spasms, Decreased cognition  Visit Diagnosis: Pain in left shoulder  Stiffness of left shoulder, not elsewhere classified  Cramp and spasm  Proximal humeral fracture, left, closed, initial encounter     Problem List Patient Active Problem List   Diagnosis Date Noted  . Routine general medical examination at a health care facility 04/10/2015  . PALPITATIONS 05/30/2009    Caprice RedChasse, Roxanne Panek M  PT 08/12/2016, 9:30 AM  [facility] [address] [place], [place], [address] Phone: [phone]   Fax:  [phone]  Name: Kim SkiffLaura S Anderson MRN: [id number] Date of Birth: 09/27/1951

## 2016-08-14 ENCOUNTER — Ambulatory Visit: Payer: PRIVATE HEALTH INSURANCE

## 2016-08-14 DIAGNOSIS — S42202A Unspecified fracture of upper end of left humerus, initial encounter for closed fracture: Secondary | ICD-10-CM

## 2016-08-14 DIAGNOSIS — M25512 Pain in left shoulder: Secondary | ICD-10-CM | POA: Diagnosis not present

## 2016-08-14 DIAGNOSIS — M25612 Stiffness of left shoulder, not elsewhere classified: Secondary | ICD-10-CM | POA: Diagnosis not present

## 2016-08-14 DIAGNOSIS — R252 Cramp and spasm: Secondary | ICD-10-CM | POA: Diagnosis not present

## 2016-08-14 NOTE — Therapy (Signed)
[facility] [address] [place], [place], [address] Phone: [phone]   Fax:  [phone]  Physical Therapy Treatment  Patient Details  Name: Kim Anderson MRN: [id number] Date of Birth: 04/24/1951 Referring Provider: Malon KindleSteven Norris, MD  Encounter Date: 08/14/2016      PT End of Session - 08/14/16 0844    Visit Number 5   Number of Visits 24   Date for PT Re-Evaluation 10/25/16   Authorization Type Workers comp   PT Start Time 0756   PT Stop Time 0835   PT Time Calculation (min) 39 min   Activity Tolerance Patient tolerated treatment well   Behavior During Therapy [facility] for tasks assessed/performed      Past Medical History:  Diagnosis Date  . History of chest pain   . Mitral valve prolapse    responded to beta-blockers. no evidence of this on 2-d echo  . Palpitations     Past Surgical History:  Procedure Laterality Date  . CHOLECYSTECTOMY  1997  . COLONOSCOPY  11/02/2007   normal  . LEFT SHOULDER ACROMINOPLASTY      There were no vitals filed for this visit.      Subjective Assessment - 08/14/16 0800    Subjective Did not sleep much . Woke thinking about PT   Currently in Pain? Yes   Pain Score 3    Pain Location Shoulder   Pain Orientation Left   Pain Descriptors / Indicators Sharp;Heaviness   Pain Type Acute pain   Pain Onset More than a month ago   Pain Frequency Constant   Aggravating Factors  using arm   Pain Relieving Factors medications, ice heat            [facility] PT Assessment - 08/14/16 0001      PROM   Left Shoulder Flexion 126 Degrees  with pully   Left Shoulder ABduction 95 Degrees   Left Shoulder Internal Rotation 65 Degrees   Left Shoulder External Rotation 80 Degrees   Left Shoulder Horizontal ADduction 100 Degrees                     [facility] Adult PT Treatment/Exercise - 08/14/16 0001      Shoulder Exercises: Supine   Protraction AAROM;15 reps;Left   External  Rotation Left;20 reps;AAROM   Internal Rotation Left;20 reps;AAROM   ABduction AAROM;Left;5 reps  painful so did not do more reps     Shoulder Exercises: Standing   Retraction AROM;Left;15 reps   Retraction Limitations cued to not use upper trap for this  and to keep arm relaxed. she will need more cuing to do correctly     Shoulder Exercises: Pulleys   Flexion 3 minutes     Shoulder Exercises: Isometric Strengthening   Flexion Supine  10x10   Extension Supine  10x10   External Rotation Supine  10x10   Internal Rotation Supine  10x10   ABduction Supine  10x10   ADduction Supine  10x10                  PT Short Term Goals - 08/14/16 0846      PT SHORT TERM GOAL #1   Title She will be independent with inital HEP   Status Achieved     PT SHORT TERM GOAL #2   Title She will limprove active asssited flexion to 120 degrees    Baseline 126   Status Achieved     PT SHORT TERM  GOAL #3   Title She will improve acti assitive abduciton to 110 degrees    Baseline 95   Status On-going     PT SHORT TERM GOAL #4   Title she will improve acti assist ER to 85 degrees   Baseline 80   Status On-going     PT SHORT TERM GOAL #5   Title she will report pain improved with ROM and normal activity by 30% or more   Status On-going           PT Long Term Goals - 07/31/16 1027      PT LONG TERM GOAL #1   Title she will be independnet with all HEP issued   Time 12   Period Weeks   Status New     PT LONG TERM GOAL #2   Title She will return to work normla duties    Time 12   Period Weeks   Status New     PT LONG TERM GOAL #3   Title She will improve so able to dress without asssit.    Time 12   Period Weeks   Status New     PT LONG TERM GOAL #4   Title She will have 1-2/10 max pain with all activity   Time 12   Period Weeks   Status New     PT LONG TERM GOAL #5   Title She will be able to ligft 30 pounds from floor and to shoulder height with 2 arms to  demo improved functinal strength   Time 12   Period Weeks   Status New               Plan - 08/14/16 0845    Clinical Impression Statement She is improving with ROm except abduction which continues to be quite painful so we do not push this. Continue per plan, orders   PT Treatment/Interventions Cryotherapy;Electrical Stimulation;Iontophoresis 4mg /ml Dexamethasone;Moist Heat;Ultrasound;Patient/family education;Manual techniques;Therapeutic exercise;Dry needling;Taping   PT Next Visit Plan Continue AAROM, continue isometrics , cont sapula strength /stab, modalities PRN   Consulted and Agree with Plan of Care Patient      Patient will benefit from skilled therapeutic intervention in order to improve the following deficits and impairments:  Decreased range of motion, Pain, Decreased strength, Decreased activity tolerance, Impaired UE functional use, Increased muscle spasms, Decreased cognition  Visit Diagnosis: Pain in left shoulder  Stiffness of left shoulder, not elsewhere classified  Cramp and spasm  Proximal humeral fracture, left, closed, initial encounter     Problem List Patient Active Problem List   Diagnosis Date Noted  . Routine general medical examination at a health care facility 04/10/2015  . PALPITATIONS 05/30/2009    Caprice Red  PT 08/14/2016, 8:48 AM  [facility] [address] [place], [place], [address] Phone: [phone]   Fax:  [phone]  Name: Kim Anderson MRN: [id number] Date of Birth: 07-Oct-1951

## 2016-08-19 ENCOUNTER — Ambulatory Visit: Payer: PRIVATE HEALTH INSURANCE

## 2016-08-19 DIAGNOSIS — M25512 Pain in left shoulder: Secondary | ICD-10-CM | POA: Diagnosis not present

## 2016-08-19 DIAGNOSIS — M25612 Stiffness of left shoulder, not elsewhere classified: Secondary | ICD-10-CM

## 2016-08-19 DIAGNOSIS — R252 Cramp and spasm: Secondary | ICD-10-CM | POA: Diagnosis not present

## 2016-08-19 DIAGNOSIS — S42202A Unspecified fracture of upper end of left humerus, initial encounter for closed fracture: Secondary | ICD-10-CM | POA: Diagnosis not present

## 2016-08-19 NOTE — Therapy (Signed)
[facility] [address] [place], [place], [address] Phone: [phone]   Fax:  [phone]  Physical Therapy Treatment  Patient Details  Name: Kim SkiffLaura S Anderson MRN: [id number] Date of Birth: 07/01/1951 Referring Provider: Malon KindleSteven Norris, MD  Encounter Date: 08/19/2016      PT End of Session - 08/19/16 0930    Visit Number 6   Number of Visits 24   Date for PT Re-Evaluation 10/25/16   Authorization Type Workers comp   PT Start Time 0930   PT Stop Time 1015   PT Time Calculation (min) 45 min   Activity Tolerance Patient tolerated treatment well;Patient limited by pain   Behavior During Therapy [facility] for tasks assessed/performed      Past Medical History:  Diagnosis Date  . History of chest pain   . Mitral valve prolapse    responded to beta-blockers. no evidence of this on 2-d echo  . Palpitations     Past Surgical History:  Procedure Laterality Date  . CHOLECYSTECTOMY  1997  . COLONOSCOPY  11/02/2007   normal  . LEFT SHOULDER ACROMINOPLASTY      There were no vitals filed for this visit.          [facility] 08/19/16 0001      PROM   Left Shoulder Flexion 126 Degrees  pully   Left Shoulder ABduction 100 Degrees  act assist                     [facility] Adult PT Treatment/Exercise - 08/19/16 0932      Shoulder Exercises: Pulleys   Flexion 3 minutes     Shoulder Exercises: Isometric Strengthening   Flexion Supine  10x10   Extension Supine  10x10   External Rotation Supine  10x10   Internal Rotation Supine  10x10   ABduction Supine  10x10   ADduction Supine  10x10     Manual Therapy   Joint Mobilization gentle Gr 2  distraction and inferior and posterior glides , no pain   Passive ROM gentl AAROM flexion/ abduction/ and ER                   PT Short Term Goals - 08/19/16 1011      PT SHORT TERM GOAL #1   Title She will be independent with inital HEP   Status  Achieved     PT SHORT TERM GOAL #2   Title She will improve active asssited flexion to 120 degrees    Baseline 126   Status Achieved     PT SHORT TERM GOAL #3   Title She will improve active  assited abduciton to 110 degrees    Baseline 100 today   Status On-going           PT Long Term Goals - 07/31/16 1027      PT LONG TERM GOAL #1   Title she will be independnet with all HEP issued   Time 12   Period Weeks   Status New     PT LONG TERM GOAL #2   Title She will return to work normla duties    Time 12   Period Weeks   Status New     PT LONG TERM GOAL #3   Title She will improve so able to dress without asssit.    Time 12   Period Weeks   Status New     PT LONG TERM GOAL #  4   Title She will have 1-2/10 max pain with all activity   Time 12   Period Weeks   Status New     PT LONG TERM GOAL #5   Title She will be able to ligft 30 pounds from floor and to shoulder height with 2 arms to demo improved functinal strength   Time 12   Period Weeks   Status New               Plan - 08/19/16 1008    Clinical Impression Statement Actively she is able to move arm better but remined to not lift or use arm active and use pain as guide  to stop activity.  She is frustrated to not be able to do more or use arm better. Arm feel heavy and  get catch in shoulder. with AAROM   PT Treatment/Interventions Cryotherapy;Electrical Stimulation;Iontophoresis 4mg /ml Dexamethasone;Moist Heat;Ultrasound;Patient/family education;Manual techniques;Therapeutic exercise;Dry needling;Taping   PT Next Visit Plan Continue AAROM, continue isometrics , cont sapula strength /stab, modalities PRN   Consulted and Agree with Plan of Care Patient      Patient will benefit from skilled therapeutic intervention in order to improve the following deficits and impairments:  Decreased range of motion, Pain, Decreased strength, Decreased activity tolerance, Impaired UE functional use, Increased muscle  spasms, Decreased cognition, Hypermobility  Visit Diagnosis: Pain in left shoulder  Stiffness of left shoulder, not elsewhere classified  Proximal humeral fracture, left, closed, initial encounter  Cramp and spasm     Problem List Patient Active Problem List   Diagnosis Date Noted  . Routine general medical examination at a health care facility 04/10/2015  . PALPITATIONS 05/30/2009    Caprice Red  PT 08/19/2016, 10:14 AM  [facility] [address] [place], [place], [address] Phone: [phone]   Fax:  [phone]  Name: Kim Anderson MRN: [id number] Date of Birth: 1951-07-06

## 2016-08-23 ENCOUNTER — Ambulatory Visit: Payer: PRIVATE HEALTH INSURANCE

## 2016-08-23 DIAGNOSIS — S42202A Unspecified fracture of upper end of left humerus, initial encounter for closed fracture: Secondary | ICD-10-CM | POA: Diagnosis not present

## 2016-08-23 DIAGNOSIS — R252 Cramp and spasm: Secondary | ICD-10-CM

## 2016-08-23 DIAGNOSIS — M25512 Pain in left shoulder: Secondary | ICD-10-CM | POA: Diagnosis not present

## 2016-08-23 DIAGNOSIS — M25612 Stiffness of left shoulder, not elsewhere classified: Secondary | ICD-10-CM | POA: Diagnosis not present

## 2016-08-23 NOTE — Therapy (Signed)
[facility] [address] [place], [place], [address] Phone: [phone]   Fax:  [phone]  Physical Therapy Treatment  Patient Details  Name: Kim Anderson MRN: [id number] Date of Birth: 1951-11-09 Referring Provider: Malon Kindle, MD  Encounter Date: 08/23/2016      PT End of Session - 08/23/16 0924    Visit Number 7   Number of Visits 24   Date for PT Re-Evaluation 10/25/16   Authorization Type Workers comp   PT Start Time 0845   PT Stop Time 0930   PT Time Calculation (min) 45 min   Activity Tolerance No increased pain   Behavior During Therapy WFL for tasks assessed/performed      Past Medical History:  Diagnosis Date  . History of chest pain   . Mitral valve prolapse    responded to beta-blockers. no evidence of this on 2-d echo  . Palpitations     Past Surgical History:  Procedure Laterality Date  . CHOLECYSTECTOMY  1997  . COLONOSCOPY  11/02/2007   normal  . LEFT SHOULDER ACROMINOPLASTY      There were no vitals filed for this visit.      Subjective Assessment - 08/23/16 0919    Subjective  changes in activity but more pain and catching with movement even with pendulum exercises   Currently in Pain? Yes   Pain Score 3   can be into severe with movement   Pain Location Shoulder   Pain Orientation Left   Pain Descriptors / Indicators Sharp   Pain Type Acute pain   Pain Onset In the past 7 days   Pain Frequency Constant   Aggravating Factors  using arm   Pain Relieving Factors meds , ice   Multiple Pain Sites No            [facility] PT Assessment - 08/23/16 0001      PROM   Left Shoulder Flexion 126 Degrees   Left Shoulder ABduction 100 Degrees   Left Shoulder Internal Rotation 65 Degrees   Left Shoulder External Rotation 80 Degrees   Left Shoulder Horizontal ABduction 15 Degrees   Left Shoulder Horizontal ADduction 100 Degrees                     [facility] Adult PT  Treatment/Exercise - 08/23/16 0001      Shoulder Exercises: Pulleys   Flexion 3 minutes     Manual Therapy   Manual therapy comments active asssited motin but she was getting shrp catching pain so did not push. This was much worse than last visit.  Did some soft tissue work that was not particulatly painful excet along humerus medially . This did not stop catch      Ice pack 18 min          PT Education - 08/23/16 0924    Education provided Yes   Education Details possible cause of new pain and probably xray is needed to be sure everything is alright   Person(s) Educated Patient   Methods Explanation   Comprehension Verbalized understanding          PT Short Term Goals - 08/19/16 1011      PT SHORT TERM GOAL #1   Title She will be independent with inital HEP   Status Achieved     PT SHORT TERM GOAL #2   Title She will improve active asssited flexion to 120 degrees    Baseline 126  Status Achieved     PT SHORT TERM GOAL #3   Title She will improve active  assited abduciton to 110 degrees    Baseline 100 today   Status On-going           PT Long Term Goals - 07/31/16 1027      PT LONG TERM GOAL #1   Title she will be independnet with all HEP issued   Time 12   Period Weeks   Status New     PT LONG TERM GOAL #2   Title She will return to work normla duties    Time 12   Period Weeks   Status New     PT LONG TERM GOAL #3   Title She will improve so able to dress without asssit.    Time 12   Period Weeks   Status New     PT LONG TERM GOAL #4   Title She will have 1-2/10 max pain with all activity   Time 12   Period Weeks   Status New     PT LONG TERM GOAL #5   Title She will be able to ligft 30 pounds from floor and to shoulder height with 2 arms to demo improved functinal strength   Time 12   Period Weeks   Status New               Plan - 08/23/16 [id number]    Clinical Impression Statement increased pain today so did little and  avoided anything that gave sharp pain.  Is she is improved Monday we will treat and get measurements for Dr Ranell PatrickNorris if not she will see Dr Ranell PatrickNorris before returning to PT. Measurements today are from previous session of PT not today   PT Treatment/Interventions Cryotherapy;Electrical Stimulation;Iontophoresis 4mg /ml Dexamethasone;Moist Heat;Ultrasound;Patient/family education;Manual techniques;Therapeutic exercise;Dry needling;Taping   PT Next Visit Plan AAROm amd measure Monday if returns after MD visit treat per his instructions   Consulted and Agree with Plan of Care Patient      Patient will benefit from skilled therapeutic intervention in order to improve the following deficits and impairments:  Decreased range of motion, Pain, Decreased strength, Decreased activity tolerance, Impaired UE functional use, Increased muscle spasms, Decreased cognition, Hypermobility  Visit Diagnosis: Pain in left shoulder  Stiffness of left shoulder, not elsewhere classified  Proximal humeral fracture, left, closed, initial encounter  Cramp and spasm     Problem List Patient Active Problem List   Diagnosis Date Noted  . Routine general medical examination at a health care facility 04/10/2015  . PALPITATIONS 05/30/2009    Caprice RedChasse, Kacen Mellinger M  PT 08/23/2016, 9:29 AM  [facility] [address] [place], [place], [address] Phone: [phone]   Fax:  [phone]  Name: Kim SkiffLaura S Anderson MRN: [id number] Date of Birth: 06/24/1951

## 2016-08-23 NOTE — Patient Instructions (Signed)
She was instructed to rest arm and ice over weekend , using pillow and sling. If pain eased she will come in Monday if not she will cancel and see Dr Ranell PatrickNorris  Tuesday

## 2016-08-26 ENCOUNTER — Ambulatory Visit: Payer: PRIVATE HEALTH INSURANCE

## 2016-08-28 ENCOUNTER — Ambulatory Visit: Payer: PRIVATE HEALTH INSURANCE

## 2016-08-28 MED FILL — KETOROLAC 10 MG TABLET: 10 | 3 days supply | Qty: 9 | Fill #0

## 2016-09-02 ENCOUNTER — Ambulatory Visit: Payer: PRIVATE HEALTH INSURANCE | Attending: Internal Medicine | Admitting: Physical Therapy

## 2016-09-02 DIAGNOSIS — M6281 Muscle weakness (generalized): Secondary | ICD-10-CM | POA: Insufficient documentation

## 2016-09-02 DIAGNOSIS — R252 Cramp and spasm: Secondary | ICD-10-CM | POA: Insufficient documentation

## 2016-09-02 DIAGNOSIS — M25612 Stiffness of left shoulder, not elsewhere classified: Secondary | ICD-10-CM | POA: Insufficient documentation

## 2016-09-02 DIAGNOSIS — M25512 Pain in left shoulder: Secondary | ICD-10-CM | POA: Diagnosis not present

## 2016-09-02 NOTE — Therapy (Signed)
[facility] [address] [place], [place], [address] Phone: [phone]   Fax:  [phone]  Physical Therapy Treatment  Patient Details  Name: Kim Anderson MRN: [id number] Date of Birth: 11/22/1951 Referring Provider: Malon KindleSteven Norris, MD  Encounter Date: 09/02/2016      PT End of Session - 09/02/16 1500    Visit Number 8   Number of Visits 24   Date for PT Re-Evaluation 10/25/16   PT Start Time 1104   PT Stop Time 1145   PT Time Calculation (min) 41 min   Activity Tolerance Patient tolerated treatment well   Behavior During Therapy [facility] for tasks assessed/performed      Past Medical History:  Diagnosis Date  . History of chest pain   . Mitral valve prolapse    responded to beta-blockers. no evidence of this on 2-d echo  . Palpitations     Past Surgical History:  Procedure Laterality Date  . CHOLECYSTECTOMY  1997  . COLONOSCOPY  11/02/2007   normal  . LEFT SHOULDER ACROMINOPLASTY      There were no vitals filed for this visit.      Subjective Assessment - 09/02/16 1448    Subjective Md injected anterior shoulder with cortizone.  It lasted 1 hour.  X-rays looked good.  He said I had some inflamation in there,  New order for strengthening PRE's with thera bands, de sensitization with soft tissue work,  Home TENS , exercise   Currently in Pain? Yes   Pain Score 3    Pain Location Shoulder   Pain Orientation Left   Pain Descriptors / Indicators Aching   Pain Type Acute pain   Aggravating Factors  dressing, using arm   Pain Relieving Factors meds , ice,  shower   Effect of Pain on Daily Activities Cannot lift more than 2 pounds.                         [facility] Adult PT Treatment/Exercise - 09/02/16 0001      Shoulder Exercises: Seated   Other Seated Exercises UE ranger: circles, reach oorward,  ER, / IR abduction, cued intermittantly for technique, letting shoulder relax,  pain free encouraged.   PTA blocking motion at times to limitr range due to pain.       Shoulder Exercises: Standing   External Rotation 10 reps   Theraband Level (Shoulder External Rotation) Level 1 (Yellow)  HEP.  Pain free encouraged.   Row 10 reps  HEP   Theraband Level (Shoulder Row) Level 1 (Yellow)     Manual Therapy   Manual Therapy Soft tissue mobilization   Soft tissue mobilization neck, shoulder girdle followed by gentle stretchung of Pecs to help posture and decrease stiffness.                 PT Education - 09/02/16 1459    Education provided Yes   Education Details band exercises   Person(s) Educated Patient   Methods Explanation;Demonstration;Verbal cues;Handout   Comprehension Verbalized understanding;Returned demonstration          PT Short Term Goals - 09/02/16 1506      PT SHORT TERM GOAL #1   Title She will be independent with inital HEP   Status Achieved     PT SHORT TERM GOAL #2   Title She will improve active asssited flexion to 120 degrees    Status Achieved     PT SHORT  TERM GOAL #3   Title She will improve active  assited abduciton to 110 degrees    Time 4   Period Weeks   Status Unable to assess     PT SHORT TERM GOAL #4   Title she will improve acti assist ER to 85 degrees   Time 4   Period Weeks   Status On-going     PT SHORT TERM GOAL #5   Title she will report pain improved with ROM and normal activity by 30% or more   Baseline not yet 30 % improved   Time 4   Period Weeks   Status On-going           PT Long Term Goals - 07/31/16 1027      PT LONG TERM GOAL #1   Title she will be independnet with all HEP issued   Time 12   Period Weeks   Status New     PT LONG TERM GOAL #2   Title She will return to work normla duties    Time 12   Period Weeks   Status New     PT LONG TERM GOAL #3   Title She will improve so able to dress without asssit.    Time 12   Period Weeks   Status New     PT LONG TERM GOAL #4   Title She will  have 1-2/10 max pain with all activity   Time 12   Period Weeks   Status New     PT LONG TERM GOAL #5   Title She will be able to ligft 30 pounds from floor and to shoulder height with 2 arms to demo improved functinal strength   Time 12   Period Weeks   Status New               Plan - 09/02/16 1500    Clinical Impression Statement Pain 4/10 at end of session.  Declined the need of modalities.  Manual helpful with pain in soft tissue. She needs assist with doffing jacket.  MD wants PRE's with band, Home TENS (Case worker to send to home) desensitization .  Proigress toward home exercise goal.    PT Next Visit Plan review exercise, Try more bands.  measure ROM?   PT Home Exercise Plan Row and ER yellow band   Consulted and Agree with Plan of Care Patient      Patient will benefit from skilled therapeutic intervention in order to improve the following deficits and impairments:  Decreased range of motion, Pain, Decreased strength, Decreased activity tolerance, Impaired UE functional use, Increased muscle spasms, Decreased cognition, Hypermobility  Visit Diagnosis: Acute pain of left shoulder  Stiffness of left shoulder, not elsewhere classified  Cramp and spasm     Problem List Patient Active Problem List   Diagnosis Date Noted  . Routine general medical examination at a health care facility 04/10/2015  . PALPITATIONS 05/30/2009    Kim Anderson PTA 09/02/2016, 3:12 PM  [facility] [address] [place], [place], [address] Phone: [phone]   Fax:  [phone]  Name: Kim Anderson MRN: [id number] Date of Birth: 1951-09-10

## 2016-09-02 NOTE — Patient Instructions (Addendum)
(  Home) Retraction: Row - Bilateral (Anchor)    Facing anchor, arms reaching forward, pull hands toward stomach, pinching shoulder blades together. Repeat __10__ times per set. Do _1-2___ sets. Use __yellow band.  Copyright  VHI. All rights reserved.  EXercise drawer :  ER with band 1 - 2 X a day yellow

## 2016-09-04 ENCOUNTER — Ambulatory Visit: Payer: PRIVATE HEALTH INSURANCE | Admitting: Physical Therapy

## 2016-09-04 DIAGNOSIS — M25512 Pain in left shoulder: Secondary | ICD-10-CM | POA: Diagnosis not present

## 2016-09-04 DIAGNOSIS — R252 Cramp and spasm: Secondary | ICD-10-CM | POA: Diagnosis not present

## 2016-09-04 DIAGNOSIS — M25612 Stiffness of left shoulder, not elsewhere classified: Secondary | ICD-10-CM | POA: Diagnosis not present

## 2016-09-04 DIAGNOSIS — M6281 Muscle weakness (generalized): Secondary | ICD-10-CM | POA: Diagnosis not present

## 2016-09-04 NOTE — Patient Instructions (Signed)
Strengthening: Resisted Internal Rotation   Hold tubing in left hand, elbow at side and forearm out. Rotate forearm in across body. Repeat __10-20__ times per set. Do __1-2__ sets per session. Do 1-2____ sessions per day.   Use a rolled towel under arm  http://orth.exer.us/830   Copyright  VHI. All rights reserved.  Strengthening: Resisted External Rotation   Hold tubing in right hand, elbow at side and forearm across body. Rotate forearm out. Repeat __10__ times per set. Do 1-2____ sets per session. Do _1-2___ sessions per day.  ROLL towel under arm  http://orth.exer.us/828   Copyright  VHI. All rights reserved.  Strengthening: Resisted Flexion     http://orth.exer.us/824   Copyright  VHI. All rights reserved.  Strengthening: Resisted Extension   Hold tubing in right hand, arm forward. Pull arm back, elbow straight. Repeat __10-20__ times per set. Do _1-2___ sets per session. Do _1-2___ sessions per day.     Start this later  http://orth.exer.us/832   Copyright  VHI. All rights reserved.      Stand with back to door.   Press hand forward pulling band.  10-20.  Yellow band.  1-2 x a day.

## 2016-09-04 NOTE — Therapy (Signed)
[facility] [address] [place], [place], [address] Phone: [phone]   Fax:  [phone]  Physical Therapy Treatment  Patient Details  Name: Kim Anderson MRN: [id number] Date of Birth: May 21, 1951 Referring Provider: Malon Kindle, MD  Encounter Date: 09/04/2016      PT End of Session - 09/04/16 0831    Visit Number 9   Number of Visits 24   PT Start Time 0730   PT Stop Time 0825   PT Time Calculation (min) 55 min   Activity Tolerance Patient tolerated treatment well   Behavior During Therapy [facility] for tasks assessed/performed      Past Medical History:  Diagnosis Date  . History of chest pain   . Mitral valve prolapse    responded to beta-blockers. no evidence of this on 2-d echo  . Palpitations     Past Surgical History:  Procedure Laterality Date  . CHOLECYSTECTOMY  1997  . COLONOSCOPY  11/02/2007   normal  . LEFT SHOULDER ACROMINOPLASTY      There were no vitals filed for this visit.      Subjective Assessment - 09/04/16 0729    Subjective Pain is not bad at all.  It is not bad at all.  Exercises with bands are doing well.  I was able to walk 2.5 miles and was able to swing my arms.  It felt so good.  Able to take her shirt off by myself.     Currently in Pain? Yes   Pain Score --  mild   Pain Location Shoulder            [facility] PT Assessment - 09/04/16 0001      ROM / Strength   AROM / PROM / Strength AROM     AROM   AROM Assessment Site Shoulder   Right/Left Shoulder Left   Left Shoulder Flexion 117 Degrees                     [facility] Adult PT Treatment/Exercise - 09/04/16 0001      Shoulder Exercises: Seated   External Rotation 10 reps   Theraband Level (Shoulder External Rotation) Level 1 (Yellow)   Other Seated Exercises --     Shoulder Exercises: Standing   Row 10 reps   Theraband Level (Shoulder Row) Level 1 (Yellow)   Other Standing Exercises cabinet opening,   reaching to 1st shelf,  remove and place vase.  cues for shoulder position.     Other Standing Exercises [place], yellow band 10 X each,  towel roll for Rotatons, HEP     Moist Heat Therapy   Number Minutes Moist Heat 10 Minutes   Moist Heat Location Shoulder     Manual Therapy   Manual Therapy Soft tissue mobilization   Soft tissue mobilization neck, shoulder girdle followed by gentle stretchung of Pecs to help posture and decrease stiffness.                 PT Education - 09/04/16 0758    Education provided Yes   Education Details rockwood   Person(s) Educated Patient   Methods Explanation;Demonstration;Tactile cues;Verbal cues;Handout   Comprehension Verbalized understanding;Returned demonstration          PT Short Term Goals - 09/02/16 1506      PT SHORT TERM GOAL #1   Title She will be independent with inital HEP   Status Achieved     PT SHORT TERM GOAL #2  Title She will improve active asssited flexion to 120 degrees    Status Achieved     PT SHORT TERM GOAL #3   Title She will improve active  assited abduciton to 110 degrees    Time 4   Period Weeks   Status Unable to assess     PT SHORT TERM GOAL #4   Title she will improve acti assist ER to 85 degrees   Time 4   Period Weeks   Status On-going     PT SHORT TERM GOAL #5   Title she will report pain improved with ROM and normal activity by 30% or more   Baseline not yet 30 % improved   Time 4   Period Weeks   Status On-going           PT Long Term Goals - 07/31/16 1027      PT LONG TERM GOAL #1   Title she will be independnet with all HEP issued   Time 12   Period Weeks   Status New     PT LONG TERM GOAL #2   Title She will return to work normla duties    Time 12   Period Weeks   Status New     PT LONG TERM GOAL #3   Title She will improve so able to dress without asssit.    Time 12   Period Weeks   Status New     PT LONG TERM GOAL #4   Title She will have 1-2/10 max pain  with all activity   Time 12   Period Weeks   Status New     PT LONG TERM GOAL #5   Title She will be able to ligft 30 pounds from floor and to shoulder height with 2 arms to demo improved functinal strength   Time 12   Period Weeks   Status New               Plan - 09/04/16 [id number]    Clinical Impression Statement Mild pain post session.  110 AROM.  Able to progress her home exercise program.    PT Next Visit Plan FOTO. ask about UE ranger in sink.  review exercises   PT Home Exercise Plan [place],  UE Ranger simulation in the sink   Consulted and Agree with Plan of Care Patient      Patient will benefit from skilled therapeutic intervention in order to improve the following deficits and impairments:  Decreased range of motion, Pain, Decreased strength, Decreased activity tolerance, Impaired UE functional use, Increased muscle spasms, Decreased cognition, Hypermobility  Visit Diagnosis: Acute pain of left shoulder  Stiffness of left shoulder, not elsewhere classified  Cramp and spasm     Problem List Patient Active Problem List   Diagnosis Date Noted  . Routine general medical examination at a health care facility 04/10/2015  . PALPITATIONS 05/30/2009    Korie Brabson PTA 09/04/2016, 8:43 AM  [facility] [address] [place], [place], [address] Phone: [phone]   Fax:  [phone]  Name: Kim SkiffLaura S Anderson MRN: [id number] Date of Birth: 04/20/1951

## 2016-09-09 ENCOUNTER — Ambulatory Visit: Payer: PRIVATE HEALTH INSURANCE | Admitting: Physical Therapy

## 2016-09-09 DIAGNOSIS — M25512 Pain in left shoulder: Secondary | ICD-10-CM

## 2016-09-09 DIAGNOSIS — M25612 Stiffness of left shoulder, not elsewhere classified: Secondary | ICD-10-CM

## 2016-09-09 DIAGNOSIS — R252 Cramp and spasm: Secondary | ICD-10-CM | POA: Diagnosis not present

## 2016-09-09 DIAGNOSIS — M6281 Muscle weakness (generalized): Secondary | ICD-10-CM | POA: Diagnosis not present

## 2016-09-09 NOTE — Therapy (Signed)
[facility] [address] [place], [place], [address] Phone: [phone]   Fax:  [phone]  Physical Therapy Treatment  Patient Details  Name: Kim SkiffLaura S Anderson MRN: [id number] Date of Birth: 02/14/1951 Referring Provider: Malon KindleSteven Norris, MD  Encounter Date: 09/09/2016      PT End of Session - 09/09/16 1829    Visit Number 10   Number of Visits 24   Date for PT Re-Evaluation 10/25/16   PT Start Time 1105   PT Stop Time 1205   PT Time Calculation (min) 60 min   Behavior During Therapy [facility] for tasks assessed/performed      Past Medical History:  Diagnosis Date  . History of chest pain   . Mitral valve prolapse    responded to beta-blockers. no evidence of this on 2-d echo  . Palpitations     Past Surgical History:  Procedure Laterality Date  . CHOLECYSTECTOMY  1997  . COLONOSCOPY  11/02/2007   normal  . LEFT SHOULDER ACROMINOPLASTY      There were no vitals filed for this visit.      Subjective Assessment - 09/09/16 1105    Subjective Pain is OK.  Able to move shoulder more than prior to injection   Currently in Pain? Yes   Pain Score --  mild   Pain Location Shoulder   Pain Descriptors / Indicators Sore   Aggravating Factors  reaching behind, dressing/undressiog   Pain Relieving Factors rest,  massage,  ice, shower   Effect of Pain on Daily Activities cannot lift more than 2 pounds            [facility] - 09/09/16 0001      AROM   Left Shoulder Flexion --  85 prior to PT                     [facility] Adult PT Treatment/Exercise - 09/09/16 1827      Shoulder Exercises: Sidelying   External Rotation 10 reps   ABduction 10 reps   ABduction Limitations slow guarded     Manual Therapy   Manual Therapy Soft tissue mobilization   Soft tissue mobilization shoulder, peri scapula to soften and prepare for exercise                  PT Short Term Goals - 09/09/16 1833      PT SHORT TERM GOAL #1   Title She will be independent with inital HEP   Time 3   Period Weeks   Status Achieved     PT SHORT TERM GOAL #2   Title She will improve active asssited flexion to 120 degrees    Status Achieved     PT SHORT TERM GOAL #3   Title She will improve active  assited abduciton to 110 degrees    Time 4   Period Weeks   Status Unable to assess     PT SHORT TERM GOAL #4   Title she will improve acti assist ER to 85 degrees   Time 4   Period Weeks   Status Unable to assess     PT SHORT TERM GOAL #5   Title she will report pain improved with ROM and normal activity by 30% or more   Baseline pain improving,  normal activity limited.   Time 4   Period Weeks   Status On-going           PT Long Term  Goals - 07/31/16 1027      PT LONG TERM GOAL #1   Title she will be independnet with all HEP issued   Time 12   Period Weeks   Status New     PT LONG TERM GOAL #2   Title She will return to work normla duties    Time 12   Period Weeks   Status New     PT LONG TERM GOAL #3   Title She will improve so able to dress without asssit.    Time 12   Period Weeks   Status New     PT LONG TERM GOAL #4   Title She will have 1-2/10 max pain with all activity   Time 12   Period Weeks   Status New     PT LONG TERM GOAL #5   Title She will be able to ligft 30 pounds from floor and to shoulder height with 2 arms to demo improved functinal strength   Time 12   Period Weeks   Status New               Plan - 09/09/16 1831    Clinical Impression Statement 85 AROM flexion prior to exercises today.  Shoulder sore initiall then pain decreased.  Able to progress to some red band exercises (IR/ row)   PT Next Visit Plan review bands.  Guided reaching  AROM VS PROM   PT Home Exercise Plan [place] yellow/red   Consulted and Agree with Plan of Care Patient      Patient will benefit from skilled therapeutic intervention in order to improve the following  deficits and impairments:  Decreased range of motion, Pain, Decreased strength, Decreased activity tolerance, Impaired UE functional use, Increased muscle spasms, Decreased cognition, Hypermobility  Visit Diagnosis: Acute pain of left shoulder  Stiffness of left shoulder, not elsewhere classified  Cramp and spasm  Left shoulder pain, unspecified chronicity     Problem List Patient Active Problem List   Diagnosis Date Noted  . Routine general medical examination at a health care facility 04/10/2015  . PALPITATIONS 05/30/2009    Kim Anderson PTA 09/09/2016, 6:36 PM  [facility] [address] [place], [place], [address] Phone: [phone]   Fax:  [phone]  Name: Kim Anderson MRN: [id number] Date of Birth: 08/11/1951

## 2016-09-12 ENCOUNTER — Ambulatory Visit: Payer: PRIVATE HEALTH INSURANCE

## 2016-09-12 DIAGNOSIS — M25612 Stiffness of left shoulder, not elsewhere classified: Secondary | ICD-10-CM

## 2016-09-12 DIAGNOSIS — M25512 Pain in left shoulder: Secondary | ICD-10-CM

## 2016-09-12 DIAGNOSIS — R252 Cramp and spasm: Secondary | ICD-10-CM

## 2016-09-12 DIAGNOSIS — M6281 Muscle weakness (generalized): Secondary | ICD-10-CM | POA: Diagnosis not present

## 2016-09-12 NOTE — Therapy (Signed)
[facility] [address] [place], [place], [address] Phone: [phone]   Fax:  [phone]  Physical Therapy Treatment  Patient Details  Name: Marjie SkiffLaura S Strang MRN: [id number] Date of Birth: 09/08/1951 Referring Provider: Malon KindleSteven Norris, MD  Encounter Date: 09/12/2016      PT End of Session - 09/12/16 0943    Visit Number 11   Number of Visits 24   Date for PT Re-Evaluation 10/25/16   Authorization Type Workers comp   PT Start Time 0800   PT Stop Time 0845   PT Time Calculation (min) 45 min   Activity Tolerance Patient tolerated treatment well;No increased pain   Behavior During Therapy WFL for tasks assessed/performed      Past Medical History:  Diagnosis Date  . History of chest pain   . Mitral valve prolapse    responded to beta-blockers. no evidence of this on 2-d echo  . Palpitations     Past Surgical History:  Procedure Laterality Date  . CHOLECYSTECTOMY  1997  . COLONOSCOPY  11/02/2007   normal  . LEFT SHOULDER ACROMINOPLASTY      There were no vitals filed for this visit.      Subjective Assessment - 09/12/16 0804    Subjective As long as I don't do anything stupid its OK   Currently in Pain? Yes   Pain Score 5   Feels tired at rest but pain with end range movement   Pain Location Shoulder   Pain Orientation Left   Pain Type --  sub acute   Pain Onset 1 to 4 weeks ago   Pain Frequency Intermittent   Aggravating Factors  end range movements   Pain Relieving Factors rest , cold, shower   Multiple Pain Sites No            [facility] PT Assessment - 09/12/16 0001      AROM   Right/Left Shoulder Left   Left Shoulder Flexion 107 Degrees   Left Shoulder ABduction 80 Degrees   Left Shoulder Internal Rotation 20 Degrees   Left Shoulder External Rotation 45 Degrees                     [facility] Adult PT Treatment/Exercise - 09/12/16 0808      Shoulder Exercises: Supine   Protraction  Limitations REaching overshoulder bench press x 15   External Rotation Left;20 reps   Internal Rotation Left;20 reps;Theraband     Shoulder Exercises: Sidelying   External Rotation AROM;Left;20 reps   ABduction AROM;Left;20 reps     Shoulder Exercises: Standing   Other Standing Exercises [place] with green band 15 reps no pain.      Shoulder Exercises: Pulleys   Flexion 3 minutes     Manual Therapy   Other Manual Therapy Gr 2-3 distraction and posterior glides x 25-30                   PT Short Term Goals - 09/09/16 1833      PT SHORT TERM GOAL #1   Title She will be independent with inital HEP   Time 3   Period Weeks   Status Achieved     PT SHORT TERM GOAL #2   Title She will improve active asssited flexion to 120 degrees    Status Achieved     PT SHORT TERM GOAL #3   Title She will improve active  assited abduciton to 110 degrees  Time 4   Period Weeks   Status Unable to assess     PT SHORT TERM GOAL #4   Title she will improve acti assist ER to 85 degrees   Time 4   Period Weeks   Status Unable to assess     PT SHORT TERM GOAL #5   Title she will report pain improved with ROM and normal activity by 30% or more   Baseline pain improving,  normal activity limited.   Time 4   Period Weeks   Status On-going           PT Long Term Goals - 07/31/16 1027      PT LONG TERM GOAL #1   Title she will be independnet with all HEP issued   Time 12   Period Weeks   Status New     PT LONG TERM GOAL #2   Title She will return to work normla duties    Time 12   Period Weeks   Status New     PT LONG TERM GOAL #3   Title She will improve so able to dress without asssit.    Time 12   Period Weeks   Status New     PT LONG TERM GOAL #4   Title She will have 1-2/10 max pain with all activity   Time 12   Period Weeks   Status New     PT LONG TERM GOAL #5   Title She will be able to ligft 30 pounds from floor and to shoulder height with 2 arms  to demo improved functinal strength   Time 12   Period Weeks   Status New               Plan - 09/12/16 0944    Clinical Impression Statement ROM and tolerance of resistance much improved with no incr pain post . She continues to have sharp end range pain.    PT Treatment/Interventions Cryotherapy;Electrical Stimulation;Iontophoresis 4mg /ml Dexamethasone;Moist Heat;Ultrasound;Patient/family education;Manual techniques;Therapeutic exercise;Dry needling;Taping   PT Next Visit Plan Continue AROM band resistance  PROM with pain as guide for limitaitons  Assess asssited ROm and goals   Consulted and Agree with Plan of Care Patient      Patient will benefit from skilled therapeutic intervention in order to improve the following deficits and impairments:  Decreased range of motion, Pain, Decreased strength, Decreased activity tolerance, Impaired UE functional use, Increased muscle spasms, Decreased cognition, Hypermobility  Visit Diagnosis: Stiffness of left shoulder, not elsewhere classified  Cramp and spasm  Left shoulder pain, unspecified chronicity     Problem List Patient Active Problem List   Diagnosis Date Noted  . Routine general medical examination at a health care facility 04/10/2015  . PALPITATIONS 05/30/2009    Caprice Red  PT 09/12/2016, 9:48 AM  [facility] [address] [place], [place], [address] Phone: [phone]   Fax:  [phone]  Name: PRERNA HAROLD MRN: [id number] Date of Birth: 1951-10-21

## 2016-09-16 ENCOUNTER — Ambulatory Visit: Payer: PRIVATE HEALTH INSURANCE

## 2016-09-16 DIAGNOSIS — R252 Cramp and spasm: Secondary | ICD-10-CM

## 2016-09-16 DIAGNOSIS — M6281 Muscle weakness (generalized): Secondary | ICD-10-CM | POA: Diagnosis not present

## 2016-09-16 DIAGNOSIS — M25612 Stiffness of left shoulder, not elsewhere classified: Secondary | ICD-10-CM

## 2016-09-16 DIAGNOSIS — M25512 Pain in left shoulder: Secondary | ICD-10-CM | POA: Diagnosis not present

## 2016-09-16 NOTE — Therapy (Signed)
[place], [place], [address] Phone: [phone]   Fax:  [phone]  Physical Therapy Treatment  Patient Details  Name: Kim Anderson MRN: [id number] Date of Birth: 11-Jan-1951 Referring Provider: Esmond Plants, MD  Encounter Date: 09/16/2016      PT End of Session - 09/16/16 0848    Visit Number 12   Number of Visits 24   Date for PT Re-Evaluation 10/25/16   Authorization Type Workers comp   Authorization - Visit Number 12   Authorization - Number of Visits 12   PT Start Time 0755   PT Stop Time 0840   PT Time Calculation (min) 45 min   Activity Tolerance Patient tolerated treatment well   Behavior During Therapy [facility] for tasks assessed/performed      Past Medical History:  Diagnosis Date  . History of chest pain   . Mitral valve prolapse    responded to beta-blockers. no evidence of this on 2-d echo  . Palpitations     Past Surgical History:  Procedure Laterality Date  . CHOLECYSTECTOMY  1997  . COLONOSCOPY  11/02/2007   normal  . LEFT SHOULDER ACROMINOPLASTY      There were no vitals filed for this visit.      Subjective Assessment - 09/16/16 0758    Subjective Pain about the same. More tired feeling than pain.    Currently in Pain? Yes   Pain Score 4    Pain Location Shoulder   Pain Orientation Left   Pain Descriptors / Indicators Sore;Tightness   Pain Type --  sub acute   Pain Onset More than a month ago   Pain Frequency Intermittent   Aggravating Factors  stretching   Pain Relieving Factors rest ,cold   Multiple Pain Sites No                         [facility] Adult PT Treatment/Exercise - 09/16/16 0001      Modalities   Modalities Cryotherapy     Cryotherapy   Number Minutes Cryotherapy 12 Minutes   Cryotherapy Location Shoulder  LT   Type of Cryotherapy Ice pack     Manual Therapy   Passive ROM Gentle ROM ER  hor abduction flexion    Other Manual  Therapy Gr 2-3 distraction and posterior glides x 25-30                   PT Short Term Goals - 09/16/16 1031      PT SHORT TERM GOAL #1   Title She will be independent with inital HEP   Status Achieved     PT SHORT TERM GOAL #2   Title She will improve active asssited flexion to 120 degrees    Status Achieved     PT SHORT TERM GOAL #3   Title She will improve active  assited abduciton to 110 degrees    Baseline 110 today   Status Achieved     PT SHORT TERM GOAL #4   Title she will improve acti assist ER to 85 degrees   Baseline 80 due to pain   Status On-going     PT SHORT TERM GOAL #5   Title she will report pain improved with ROM and normal activity by 30% or more   Baseline pain improving,  normal activity limited.   Status Partially Met           PT  Long Term Goals - 09/16/16 1032      PT LONG TERM GOAL #1   Title she will be independent with all HEP issued   Status On-going     PT LONG TERM GOAL #2   Title She will return to work normal duties    Status On-going     PT LONG TERM GOAL #3   Title She will improve so able to dress without asssit.    Status Partially Met     PT LONG TERM GOAL #4   Title She will have 1-2/10 max pain with all activity   Status On-going     PT LONG TERM GOAL #5   Title She will be able to ligft 30 pounds from floor and to shoulder height with 2 arms to demo improved functinal strength   Baseline not allowed yet   Status On-going               Plan - 09/16/16 0804    Clinical Impression Statement Sore post treatment but fine post cold . End range pain main pain . no pain with rockwood.  She is not ready for lifting heavier weight and stretching has to be gentle end range due to pain so she will need another 12 visists at least for max return to function   PT Treatment/Interventions Cryotherapy;Electrical Stimulation;Iontophoresis 24m/ml Dexamethasone;Moist Heat;Ultrasound;Patient/family education;Manual  techniques;Therapeutic exercise;Dry needling;Taping   PT Next Visit Plan Continue AROM band resistance  PROM with pain as guide for limitaitons  Assess asssited ROm and goals   PT Home Exercise Plan [place] yellow/red   Consulted and Agree with Plan of Care Patient      Patient will benefit from skilled therapeutic intervention in order to improve the following deficits and impairments:  Decreased range of motion, Pain, Decreased strength, Decreased activity tolerance, Impaired UE functional use, Increased muscle spasms, Decreased cognition, Hypermobility  Visit Diagnosis: Stiffness of left shoulder, not elsewhere classified  Left shoulder pain, unspecified chronicity  Cramp and spasm     Problem List Patient Active Problem List   Diagnosis Date Noted  . Routine general medical examination at a health care facility 04/10/2015  . PALPITATIONS 05/30/2009    CDarrel Hoover PT 09/16/2016, 10:34 AM  [facility] [place] [address]Phone: [phone]  Fax:  [phone] Name: Kim SWALLEYMRN: [id number] of Birth: [birth year]

## 2016-09-19 ENCOUNTER — Ambulatory Visit: Payer: PRIVATE HEALTH INSURANCE | Admitting: Physical Therapy

## 2016-09-19 DIAGNOSIS — M25512 Pain in left shoulder: Secondary | ICD-10-CM

## 2016-09-19 DIAGNOSIS — R252 Cramp and spasm: Secondary | ICD-10-CM | POA: Diagnosis not present

## 2016-09-19 DIAGNOSIS — M25612 Stiffness of left shoulder, not elsewhere classified: Secondary | ICD-10-CM

## 2016-09-19 DIAGNOSIS — M6281 Muscle weakness (generalized): Secondary | ICD-10-CM | POA: Diagnosis not present

## 2016-09-19 NOTE — Therapy (Signed)
[place], [place], [address] Phone: [phone]   Fax:  [phone]  Physical Therapy Treatment  Patient Details  Name: Kim Anderson MRN: [id number] Date of Birth: [birth year] Referring Provider: Esmond Plants, MD  Encounter Date: 09/19/2016      PT End of Session - 09/19/16 1313    Visit Number 13   Number of Visits 24   Date for PT Re-Evaluation 10/25/16   PT Start Time 0803   PT Stop Time 0900   PT Time Calculation (min) 57 min   Activity Tolerance Patient tolerated treatment well   Behavior During Therapy [facility] for tasks assessed/performed      Past Medical History:  Diagnosis Date  . History of chest pain   . Mitral valve prolapse    responded to beta-blockers. no evidence of this on 2-d echo  . Palpitations     Past Surgical History:  Procedure Laterality Date  . CHOLECYSTECTOMY  1997  . COLONOSCOPY  11/02/2007   normal  . LEFT SHOULDER ACROMINOPLASTY      There were no vitals filed for this visit.      Subjective Assessment - 09/19/16 0808    Subjective It has been sore the last few days.  1-2/10.  Sore with turning over on it .  It is hard to reach with arm.  She was able to remove coat in clinic with reaching behind her.  Unable to hook bra behind her.    Currently in Pain? Yes   Pain Score 2    Pain Orientation Anterior;Distal   Pain Descriptors / Indicators Sore   Pain Frequency Intermittent   Aggravating Factors  reaching, rolling over in bed   Pain Relieving Factors rest cold   Effect of Pain on Daily Activities reaching limited   Multiple Pain Sites No            [facility] PT Assessment - 09/19/16 0001      AROM   Left Shoulder Flexion 110 Degrees   Left Shoulder ABduction 86 Degrees   Left Shoulder Internal Rotation --  able to reach right SI                     [facility] Adult PT Treatment/Exercise - 09/19/16 0001      Shoulder Exercises: Standing   Flexion 5 reps  wall ladder with lift off   ABduction Limitations 3 X painful on wall ladder so stopped.    Other Standing Exercises self mobs 5 X with towel roll.   Other Standing Exercises [place] with green band 15 reps no pain.   towel used for rotations     Shoulder Exercises: Pulleys   Flexion --  5 minutes     Shoulder Exercises: Stretch   Wall Stretch - Flexion --  10 x both arms,      Moist Heat Therapy   Number Minutes Moist Heat 10 Minutes   Moist Heat Location Shoulder     Manual Therapy   Manual Therapy Soft tissue mobilization   Passive ROM flexion, abduction  AAROM                  PT Short Term Goals - 09/16/16 1031      PT SHORT TERM GOAL #1   Title She will be independent with inital HEP   Status Achieved     PT SHORT TERM GOAL #2   Title She will improve active asssited  flexion to 120 degrees    Status Achieved     PT SHORT TERM GOAL #3   Title She will improve active  assited abduciton to 110 degrees    Baseline 110 today   Status Achieved     PT SHORT TERM GOAL #4   Title she will improve acti assist ER to 85 degrees   Baseline 80 due to pain   Status On-going     PT SHORT TERM GOAL #5   Title she will report pain improved with ROM and normal activity by 30% or more   Baseline pain improving,  normal activity limited.   Status Partially Met           PT Long Term Goals - 09/16/16 1032      PT LONG TERM GOAL #1   Title she will be independent with all HEP issued   Status On-going     PT LONG TERM GOAL #2   Title She will return to work normal duties    Status On-going     PT [place] #3   Title She will improve so able to dress without asssit.    Status Partially Met     PT LONG TERM GOAL #4   Title She will have 1-2/10 max pain with all activity   Status On-going     PT LONG TERM GOAL #5   Title She will be able to ligft 30 pounds from floor and to shoulder height with 2 arms to demo improved functinal  strength   Baseline not allowed yet   Status On-going               Plan - 09/19/16 1317    Clinical Impression Statement AAROM 110 flexion, 86 Abduction.  Sore the last few days.  ROM gradually improving.  She has been able to reach (low levels) a little easier.   PT Next Visit Plan Continue AROM band resistance  PROM with pain as guide for limitaitons     PT Home Exercise Plan [place] yellow/red   Consulted and Agree with Plan of Care Patient      Patient will benefit from skilled therapeutic intervention in order to improve the following deficits and impairments:  Decreased range of motion, Pain, Decreased strength, Decreased activity tolerance, Impaired UE functional use, Increased muscle spasms, Decreased cognition, Hypermobility  Visit Diagnosis: Stiffness of left shoulder, not elsewhere classified  Left shoulder pain, unspecified chronicity  Cramp and spasm  Acute pain of left shoulder     Problem List Patient Active Problem List   Diagnosis Date Noted  . Routine general medical examination at a health care facility 04/10/2015  . PALPITATIONS 05/30/2009    Iva Posten  PTA 09/19/2016, 1:26 PM  [facility] [address] [place], [place], [address] Phone: [phone]   Fax:  [phone]  Name: MARQUISA SALIH MRN: [id number] Date of Birth: 23-May-1951

## 2016-09-24 ENCOUNTER — Ambulatory Visit: Payer: PRIVATE HEALTH INSURANCE

## 2016-09-24 DIAGNOSIS — R252 Cramp and spasm: Secondary | ICD-10-CM | POA: Diagnosis not present

## 2016-09-24 DIAGNOSIS — M25512 Pain in left shoulder: Secondary | ICD-10-CM

## 2016-09-24 DIAGNOSIS — M25612 Stiffness of left shoulder, not elsewhere classified: Secondary | ICD-10-CM

## 2016-09-24 DIAGNOSIS — M6281 Muscle weakness (generalized): Secondary | ICD-10-CM | POA: Diagnosis not present

## 2016-09-24 NOTE — Therapy (Signed)
[place], [place], [address] Phone: [phone]   Fax:  [phone]  Physical Therapy Treatment  Patient Details  Name: Kim Anderson MRN: [id number] Date of Birth: 04/02/1951 Referring Provider: Esmond Plants, MD  Encounter Date: 09/24/2016      PT End of Session - 09/24/16 1202    Visit Number 14   Number of Visits 24   Date for PT Re-Evaluation 10/25/16   Authorization Type Workers comp   Authorization - Visit Number 14   Authorization - Number of Visits 24   PT Start Time 1145   PT Stop Time 1230   PT Time Calculation (min) 45 min   Activity Tolerance Patient tolerated treatment well   Behavior During Therapy [facility] for tasks assessed/performed      Past Medical History:  Diagnosis Date  . History of chest pain   . Mitral valve prolapse    responded to beta-blockers. no evidence of this on 2-d echo  . Palpitations     Past Surgical History:  Procedure Laterality Date  . CHOLECYSTECTOMY  1997  . COLONOSCOPY  11/02/2007   normal  . LEFT SHOULDER ACROMINOPLASTY      There were no vitals filed for this visit.      Subjective Assessment - 09/24/16 1154    Subjective Mild soreness. MD wanted 6 weeks more PT She is cleared for full work and strengthening and  ROM. Work through pain as it will get better.    Currently in Pain? Yes   Pain Score 2    Pain Location Shoulder   Pain Orientation Left;Anterior   Pain Descriptors / Indicators Sore   Pain Type --  sub acute   Pain Onset More than a month ago   Pain Frequency Intermittent   Aggravating Factors  lying on LT side , stretching, reaching   Pain Relieving Factors cold , rest   Multiple Pain Sites No            [facility] PT Assessment - 09/24/16 0001      AROM   Left Shoulder Flexion 111 Degrees   Left Shoulder ABduction 130 Degrees   Left Shoulder Internal Rotation --  able to get back of hand to lumbar spine                      [facility] Adult PT Treatment/Exercise - 09/24/16 1254      Shoulder Exercises: Standing   Internal Rotation Limitations reaching behind back x 12 x with short breaks   Flexion Limitations flexion with ER  as to hold platter at shoulder.x 15 reps     Other Standing Exercises weight bearing stab with ball on wall x 30 reps then on counter x 15 then push up position at counter and table (high) with weight shift RT and LT x10     Shoulder Exercises: ROM/Strengthening   UBE (Upper Arm Bike) 4 min forward 4 min back L2     Shoulder Exercises: Stretch   Other Shoulder Stretches supine horizontal abduction 3-4 min                PT Education - 09/24/16 1244    Education provided Yes   Education Details added weight bearing in push up position with weight shift RT/LT (5-10 reps at counter)  , ball on wall ( 15 reps RT and LT) and reaching up back ( 2-5 reps 3x/day) and supine Hor abduciton passive  5 min for home. PAin as guide   Person(s) Educated Patient   Methods Explanation;Tactile cues;Verbal cues   Comprehension Returned demonstration;Verbalized understanding          PT Short Term Goals - 09/16/16 1031      PT SHORT TERM GOAL #1   Title She will be independent with inital HEP   Status Achieved     PT SHORT TERM GOAL #2   Title She will improve active asssited flexion to 120 degrees    Status Achieved     PT SHORT TERM GOAL #3   Title She will improve active  assited abduciton to 110 degrees    Baseline 110 today   Status Achieved     PT SHORT TERM GOAL #4   Title she will improve acti assist ER to 85 degrees   Baseline 80 due to pain   Status On-going     PT SHORT TERM GOAL #5   Title she will report pain improved with ROM and normal activity by 30% or more   Baseline pain improving,  normal activity limited.   Status Partially Met           PT Long Term Goals - 09/24/16 1251      PT LONG TERM GOAL #1   Title she will be  independent with all HEP issued   Status On-going     PT LONG TERM GOAL #2   Title She will return to work normal duties    Baseline retrun OK by MD   Status On-going     PT LONG TERM GOAL #3   Title She will improve so able to dress without asssit.    Baseline still trouble reaching behind back   Status Partially Met     PT LONG TERM GOAL #4   Title She will have 1-2/10 max pain with all activity   Baseline varies but generally no more than 2/10   Status On-going     PT LONG TERM GOAL #5   Title She will be able to ligft 30 pounds from floor and to shoulder height with 2 arms to demo improved functinal strength   Baseline cleared but have not attempted   Status On-going               Plan - 09/24/16 1247    Clinical Impression Statement Now without restrictions but we will use pain as our guide. ROM improved. Pain levels low. Should progress toward full use of LT arm   PT Treatment/Interventions Cryotherapy;Electrical Stimulation;Iontophoresis 69m/ml Dexamethasone;Moist Heat;Ultrasound;Patient/family education;Manual techniques;Therapeutic exercise;Dry needling;Taping   PT Home Exercise Plan [place] yellow/red, weight bearing stabilization, reaching behind back, hor abduction stretch supine     Consulted and Agree with Plan of Care Patient      Patient will benefit from skilled therapeutic intervention in order to improve the following deficits and impairments:  Decreased range of motion, Pain, Decreased strength, Decreased activity tolerance, Impaired UE functional use, Increased muscle spasms, Decreased cognition, Hypermobility  Visit Diagnosis: Stiffness of left shoulder, not elsewhere classified  Left shoulder pain, unspecified chronicity  Cramp and spasm  Acute pain of left shoulder     Problem List Patient Active Problem List   Diagnosis Date Noted  . Routine general medical examination at a health care facility 04/10/2015  . PALPITATIONS 05/30/2009     CDarrel Hoover PT 09/24/2016, 12:57 PM  [facility] [address]  Phone: [phone]   Fax:  [phone]  Name: Kim Anderson MRN: [id number] Date of Birth: 10-29-1951

## 2016-09-26 ENCOUNTER — Ambulatory Visit: Payer: PRIVATE HEALTH INSURANCE | Admitting: Physical Therapy

## 2016-09-26 DIAGNOSIS — M25512 Pain in left shoulder: Secondary | ICD-10-CM

## 2016-09-26 DIAGNOSIS — M25612 Stiffness of left shoulder, not elsewhere classified: Secondary | ICD-10-CM | POA: Diagnosis not present

## 2016-09-26 DIAGNOSIS — R252 Cramp and spasm: Secondary | ICD-10-CM

## 2016-09-26 DIAGNOSIS — M6281 Muscle weakness (generalized): Secondary | ICD-10-CM | POA: Diagnosis not present

## 2016-09-26 NOTE — Therapy (Signed)
[place], [place], [address] Phone: [phone]   Fax:  [phone]  Physical Therapy Treatment  Patient Details  Name: Kim Anderson MRN: [id number] Date of Birth: Sep 25, 1951 Referring Provider: Esmond Plants, MD  Encounter Date: 09/26/2016      PT End of Session - 09/26/16 0916    Visit Number 15   Number of Visits 24   Date for PT Re-Evaluation 10/25/16   Authorization - Visit Number 15   Authorization - Number of Visits 24   PT Start Time 0802   PT Stop Time 0900   PT Time Calculation (min) 58 min   Activity Tolerance Patient tolerated treatment well   Behavior During Therapy [facility] for tasks assessed/performed      Past Medical History:  Diagnosis Date  . History of chest pain   . Mitral valve prolapse    responded to beta-blockers. no evidence of this on 2-d echo  . Palpitations     Past Surgical History:  Procedure Laterality Date  . CHOLECYSTECTOMY  1997  . COLONOSCOPY  11/02/2007   normal  . LEFT SHOULDER ACROMINOPLASTY      There were no vitals filed for this visit.      Subjective Assessment - 09/26/16 0904    Subjective Very sore today.  NO number given when asked.  She is gager to get this all behind her.   Currently in Pain? --  mild to moderate soreness Left shoulder                         [facility] Adult PT Treatment/Exercise - 09/26/16 0001      Shoulder Exercises: Seated   Extension 10 reps   Theraband Level (Shoulder Extension) Level 1 (Yellow)   Row 10 reps   Theraband Level (Shoulder Row) Level 1 (Yellow)   Protraction 10 reps   Theraband Level (Shoulder Protraction) Level 1 (Yellow)   External Rotation 10 reps   Theraband Level (Shoulder External Rotation) Level 1 (Yellow)  with towel roll   Internal Rotation 10 reps   Theraband Level (Shoulder Internal Rotation) Level 1 (Yellow)  with towel roll     Shoulder Exercises: Standing   Other  Standing Exercises red ball press, both hands, into mat.  10 X4 directions   Other Standing Exercises Ball on wall, head and above height 20 second press and move circles, flexion and horizontal moves, cued  wall push ups.  10 + cues for technique.position     Shoulder Exercises: ROM/Strengthening   Other ROM/Strengthening Exercises behind Back reACH 5 X .  LT + RT , needs more time.   Other ROM/Strengthening Exercises Nu step , L3 7 minutes     Shoulder Exercises: Stretch   Corner Stretch Limitations doorway stretch, single arm  with cues 30 X 10-20 second holds.      Moist Heat Therapy   Number Minutes Moist Heat 15 Minutes   Moist Heat Location Shoulder     Manual Therapy   Manual Therapy Scapular mobilization   Soft tissue mobilization pre and post exercises.  Tissue stiff initially                  PT Short Term Goals - 09/26/16 0920      PT SHORT TERM GOAL #1   Title She will be independent with inital HEP   Status Achieved     PT SHORT TERM GOAL #2  Title She will improve active asssited flexion to 120 degrees    Time 4   Period Weeks   Status Achieved     PT SHORT TERM GOAL #3   Title She will improve active  assited abduciton to 110 degrees    Time 4   Period Weeks   Status Achieved     PT SHORT TERM GOAL #4   Title she will improve acti assist ER to 85 degrees   Time 4   Period Weeks   Status Unable to assess     PT SHORT TERM GOAL #5   Title she will report pain improved with ROM and normal activity by 30% or more   Baseline ER, reach up back now equal to right   Time 4   Period Weeks   Status Partially Met           PT Long Term Goals - 09/24/16 1251      PT LONG TERM GOAL #1   Title she will be independent with all HEP issued   Status On-going     PT LONG TERM GOAL #2   Title She will return to work normal duties    Baseline retrun OK by MD   Status On-going     PT LONG TERM GOAL #3   Title She will improve so able to dress  without asssit.    Baseline still trouble reaching behind back   Status Partially Met     PT LONG TERM GOAL #4   Title She will have 1-2/10 max pain with all activity   Baseline varies but generally no more than 2/10   Status On-going     PT LONG TERM GOAL #5   Title She will be able to ligft 30 pounds from floor and to shoulder height with 2 arms to demo improved functinal strength   Baseline cleared but have not attempted   Status On-going               Plan - 09/26/16 0918    Clinical Impression Statement Patient was able to continue strengthening today.  She arrived with increased soreness and tissue stiffness and left with less soreness (NO number given when asked,  "It is better")  Her IR is equal to right when reaching behind and up back.  extra time required.   No new goals met.    PT Next Visit Plan Continue AROM, weight bearing and band resistance  PROM with pain as guide for limitaitons  No restrictions per MD note try lifting 2 ams box incr load as comfortable.  Progress to red band?   PT Home Exercise Plan [place] yellow/red, weight bearing stabilization, reaching behind back, hor abduction stretch supine     Consulted and Agree with Plan of Care Patient      Patient will benefit from skilled therapeutic intervention in order to improve the following deficits and impairments:  Decreased range of motion, Pain, Decreased strength, Decreased activity tolerance, Impaired UE functional use, Increased muscle spasms, Decreased cognition, Hypermobility  Visit Diagnosis: Stiffness of left shoulder, not elsewhere classified  Left shoulder pain, unspecified chronicity  Cramp and spasm     Problem List Patient Active Problem List   Diagnosis Date Noted  . Routine general medical examination at a health care facility 04/10/2015  . PALPITATIONS 05/30/2009    Krystan Northrop PTA 09/26/2016, 9:23 AM  [facility] [address] [place], [place], [address] Phone: [phone]  Fax:  [phone]  Name: Kim Anderson MRN: [id number] Date of Birth: 1951-05-18

## 2016-09-30 ENCOUNTER — Ambulatory Visit: Payer: PRIVATE HEALTH INSURANCE

## 2016-09-30 DIAGNOSIS — M6281 Muscle weakness (generalized): Secondary | ICD-10-CM | POA: Diagnosis not present

## 2016-09-30 DIAGNOSIS — R252 Cramp and spasm: Secondary | ICD-10-CM | POA: Diagnosis not present

## 2016-09-30 DIAGNOSIS — M25612 Stiffness of left shoulder, not elsewhere classified: Secondary | ICD-10-CM

## 2016-09-30 DIAGNOSIS — M25512 Pain in left shoulder: Secondary | ICD-10-CM

## 2016-09-30 NOTE — Therapy (Signed)
[place], [place], [address] Phone: [phone]   Fax:  [phone]  Physical Therapy Treatment  Patient Details  Name: Kim Anderson MRN: [id number] Date of Birth: 1951-08-19 Referring Provider: Esmond Plants, MD  Encounter Date: 09/30/2016      PT End of Session - 09/30/16 0802    Visit Number 16   Number of Visits 24   Date for PT Re-Evaluation 10/25/16   Authorization Type Workers comp   Authorization - Visit Number 16   Authorization - Number of Visits 24   PT Start Time 0750   PT Stop Time 0830   PT Time Calculation (min) 40 min   Activity Tolerance Patient tolerated treatment well   Behavior During Therapy [facility] for tasks assessed/performed      Past Medical History:  Diagnosis Date  . History of chest pain   . Mitral valve prolapse    responded to beta-blockers. no evidence of this on 2-d echo  . Palpitations     Past Surgical History:  Procedure Laterality Date  . CHOLECYSTECTOMY  1997  . COLONOSCOPY  11/02/2007   normal  . LEFT SHOULDER ACROMINOPLASTY      There were no vitals filed for this visit.      Subjective Assessment - 09/30/16 0801    Subjective No pain . Returned to work last week.    Currently in Pain? No/denies                         [facility] Adult PT Treatment/Exercise - 09/30/16 0804      Therapeutic Activites    Therapeutic Activities Lifting   Lifting Lifting 20-30-35 pounds from floor to waist and shoulder height without pain and good form. Safe lifting appears to be 30 pounds to shoulder height 2 arms then LT arm reaching with 2 pounds overhead and 3 pounds x 20 with mild soreness.      Shoulder Exercises: Seated   Row 20 reps;Left   Theraband Level (Shoulder Row) Level 3 (Green)   External Rotation 20 reps   Theraband Level (Shoulder External Rotation) Level 3 (Green)   Internal Rotation Left;20 reps   Theraband Level (Shoulder  Internal Rotation) Level 3 (Green)   Flexion Left;20 reps   Theraband Level (Shoulder Flexion) Level 3 (Green)   Abduction Both   Theraband Level (Shoulder ABduction) Level 3 (Green)   ABduction Limitations hands behind back     Shoulder Exercises: Standing   External Rotation 15 reps   Theraband Level (Shoulder External Rotation) Level 3 (Green)   Internal Rotation Left;15 reps   Theraband Level (Shoulder Internal Rotation) Level 3 (Green)   Flexion 15 reps;Left   Theraband Level (Shoulder Flexion) Level 3 (Green)   ABduction Both;15 reps   Theraband Level (Shoulder ABduction) Level 3 (Green)   ABduction Limitations hands behind back   Row Left;15 reps   Theraband Level (Shoulder Row) Level 3 (Green)     Shoulder Exercises: Body Blade   ABduction 15 seconds;3 reps   External Rotation 15 seconds;3 reps                PT Education - 09/30/16 [phone]    Education provided Yes   Education Details suggested now was timne to add passive stretch to IR behind back but genle and pain as guide.    Person(s) Educated Patient   Methods Explanation;Demonstration   Comprehension Verbalized understanding;Returned demonstration  PT Short Term Goals - 09/26/16 0920      PT SHORT TERM GOAL #1   Title She will be independent with inital HEP   Status Achieved     PT SHORT TERM GOAL #2   Title She will improve active asssited flexion to 120 degrees    Time 4   Period Weeks   Status Achieved     PT SHORT TERM GOAL #3   Title She will improve active  assited abduciton to 110 degrees    Time 4   Period Weeks   Status Achieved     PT SHORT TERM GOAL #4   Title she will improve acti assist ER to 85 degrees   Time 4   Period Weeks   Status Unable to assess     PT SHORT TERM GOAL #5   Title she will report pain improved with ROM and normal activity by 30% or more   Baseline ER, reach up back now equal to right   Time 4   Period Weeks   Status Partially Met            PT Long Term Goals - 09/24/16 1251      PT LONG TERM GOAL #1   Title she will be independent with all HEP issued   Status On-going     PT LONG TERM GOAL #2   Title She will return to work normal duties    Baseline retrun OK by MD   Status On-going     PT LONG TERM GOAL #3   Title She will improve so able to dress without asssit.    Baseline still trouble reaching behind back   Status Partially Met     PT LONG TERM GOAL #4   Title She will have 1-2/10 max pain with all activity   Baseline varies but generally no more than 2/10   Status On-going     PT LONG TERM GOAL #5   Title She will be able to ligft 30 pounds from floor and to shoulder height with 2 arms to demo improved functinal strength   Baseline cleared but have not attempted   Status On-going               Plan - 09/30/16 0802    Clinical Impression Statement [name]  R with reaching behind back with motion now smooth and free to lumbar spine. Pain less with strethcing/reaching and with lifting overhead but still weakn. LKifting she appears sfe with lifting up to 30 pounds to shoulder height.    PT Treatment/Interventions Cryotherapy;Electrical Stimulation;Iontophoresis 71m/ml Dexamethasone;Moist Heat;Ultrasound;Patient/family education;Manual techniques;Therapeutic exercise;Dry needling;Taping   PT Next Visit Plan Continue AROM, weight bearing and band resistance  PROM with pain as guide for limitaitons  No restrictions per MD note try lifting 2 arms box incr load as comfortable.     PT Home Exercise Plan [place] yellow/red, weight bearing stabilization, reaching behind back, hor abduction stretch supine     Consulted and Agree with Plan of Care Patient      Patient will benefit from skilled therapeutic intervention in order to improve the following deficits and impairments:  Decreased range of motion, Pain, Decreased strength, Decreased activity tolerance, Impaired UE functional use, Increased  muscle spasms, Decreased cognition, Hypermobility  Visit Diagnosis: Stiffness of left shoulder, not elsewhere classified  Cramp and spasm  Left shoulder pain, unspecified chronicity  Muscle weakness (generalized)     Problem List Patient Active Problem List  Diagnosis Date Noted  . Routine general medical examination at a health care facility 04/10/2015  . PALPITATIONS 05/30/2009    Darrel Hoover  PT 09/30/2016, 8:41 AM  [facility] [address] [place], [place], [address] Phone: [phone]   Fax:  [phone]  Name: Kim Anderson MRN: [id number] Date of Birth: 11/14/1951

## 2016-10-02 ENCOUNTER — Ambulatory Visit: Payer: PRIVATE HEALTH INSURANCE | Attending: Internal Medicine | Admitting: Physical Therapy

## 2016-10-02 DIAGNOSIS — M25512 Pain in left shoulder: Secondary | ICD-10-CM | POA: Diagnosis not present

## 2016-10-02 DIAGNOSIS — M25612 Stiffness of left shoulder, not elsewhere classified: Secondary | ICD-10-CM | POA: Diagnosis not present

## 2016-10-02 DIAGNOSIS — M6281 Muscle weakness (generalized): Secondary | ICD-10-CM | POA: Diagnosis not present

## 2016-10-02 DIAGNOSIS — R252 Cramp and spasm: Secondary | ICD-10-CM | POA: Diagnosis not present

## 2016-10-02 DIAGNOSIS — X58XXXA Exposure to other specified factors, initial encounter: Secondary | ICD-10-CM | POA: Insufficient documentation

## 2016-10-02 DIAGNOSIS — S42252A Displaced fracture of greater tuberosity of left humerus, initial encounter for closed fracture: Secondary | ICD-10-CM | POA: Diagnosis not present

## 2016-10-02 NOTE — Therapy (Signed)
[place], [place], [address] Phone: [phone]   Fax:  [phone]  Physical Therapy Treatment  Patient Details  Name: Kim Anderson MRN: [id number] Date of Birth: 07/17/1951 Referring Provider: Esmond Plants, MD  Encounter Date: 10/02/2016      PT End of Session - 10/02/16 0902    Visit Number 17   Number of Visits 24   Date for PT Re-Evaluation 10/25/16   Authorization - Visit Number 12   Authorization - Number of Visits 24   PT Start Time 0806   PT Stop Time 0908   PT Time Calculation (min) 62 min   Activity Tolerance Patient tolerated treatment well   Behavior During Therapy [facility] for tasks assessed/performed      Past Medical History:  Diagnosis Date  . History of chest pain   . Mitral valve prolapse    responded to beta-blockers. no evidence of this on 2-d echo  . Palpitations     Past Surgical History:  Procedure Laterality Date  . CHOLECYSTECTOMY  1997  . COLONOSCOPY  11/02/2007   normal  . LEFT SHOULDER ACROMINOPLASTY      There were no vitals filed for this visit.      Subjective Assessment - 10/02/16 0817    Subjective Mild pain.  ,  Had more ain this morning because she slept on it. ( it has eased some. )   Currently in Pain? Yes   Pain Score --  mild   Pain Location Shoulder   Pain Orientation Left;Anterior   Pain Descriptors / Indicators Aching;Sore   Pain Type Acute pain   Pain Frequency Intermittent   Aggravating Factors  sleeping on her right side,  reaching behind behind  Severe tension, abductionmovements   Pain Relieving Factors rest.  cold   Effect of Pain on Daily Activities reaching and lifting limited   Multiple Pain Sites No                         [facility] Adult PT Treatment/Exercise - 10/02/16 0001      Therapeutic Activites    Therapeutic Activities Lifting   Lifting 8, 18,28  LBS.  from floor to shoulder height shelf,  both hands.   Correct technique, and able to carry 10 feet several timws.  No increased pain.      Shoulder Exercises: Standing   External Rotation 10 reps  2 sets, towel roll   Theraband Level (Shoulder External Rotation) Level 3 (Green)   Internal Rotation 10 reps  2 sets, towel roll   Theraband Level (Shoulder Internal Rotation) Level 3 (Green)   Flexion 10 reps   Shoulder Flexion Weight (lbs) 3   Row 10 reps  2 sets,  green band issued for home   Theraband Level (Shoulder Row) Level 3 (Green)   Other Standing Exercises "Punch" 10 x 2 sets, green band     Shoulder Exercises: Pulleys   Flexion 3 minutes     Shoulder Exercises: ROM/Strengthening   UBE (Upper Arm Bike) 4 minutes 2 ways    Wall Pushups 10 reps  2 sets     Shoulder Exercises: Body Blade   Flexion 1 rep;30 seconds  with movement into flexion   ABduction 1 rep;30 seconds   Other Body Blade Exercises punch forward 1 rep 30 seconds     Moist Heat Therapy   Number Minutes Moist Heat 15 Minutes   Moist Heat  Location Shoulder     Manual Therapy   Soft tissue mobilization soft tissue work shoulder area post exercise                  PT Short Term Goals - 10/02/16 0908      PT SHORT TERM GOAL #1   Title She will be independent with inital HEP   Time 3   Period Weeks   Status Achieved     PT SHORT TERM GOAL #2   Title She will improve active asssited flexion to 120 degrees    Time 4   Period Weeks   Status Achieved     PT SHORT TERM GOAL #3   Title She will improve active  assited abduciton to 110 degrees    Time 4   Period Weeks   Status Achieved     PT SHORT TERM GOAL #4   Title she will improve acti assist ER to 85 degrees   Time 4   Period Weeks   Status Unable to assess     PT SHORT TERM GOAL #5   Title she will report pain improved with ROM and normal activity by 30% or more   Baseline Pain improved at least 30 %   Time 4   Period Weeks   Status Partially Met           PT Long Term  Goals - 10/02/16 0909      PT LONG TERM GOAL #1   Title she will be independent with all HEP issued   Time 12   Period Weeks   Status On-going     PT LONG TERM GOAL #2   Title She will return to work normal duties    Baseline retrun OK by MD,  continuew in the office for the rest of this week   Time 12   Period Weeks   Status On-going     PT LONG TERM GOAL #3   Title She will improve so able to dress without asssit.    Baseline still trouble reaching behind back   Time 12   Period Weeks   Status Partially Met     PT LONG TERM GOAL #4   Title She will have 1-2/10 max pain with all activity   Time 12   Period Weeks   Status On-going     PT LONG TERM GOAL #5   Title She will be able to ligft 30 pounds from floor and to shoulder height with 2 arms to demo improved functinal strength   Baseline 28 pounds   Time 12   Period Weeks   Status Partially Met               Plan - 10/02/16 0904    Clinical Impression Statement [name] continues to make gains with strengthening. She tries to use her left arm more and finds she uses her right arm more than in the past.  No increased pain at end of session.  She is able to use good technique with lifting and carrying a box.  She was able to progress to green band for home exercises.   PT Next Visit Plan Continue AROM, weight bearing and band resistance  PROM with pain as guide for limitaitons  No restrictions per MD note try lifting 2 arms box incr load as comfortable.     PT Home Exercise Plan [place] yellow/red, weight bearing stabilization, reaching behind back, hor abduction stretch supine  Consulted and Agree with Plan of Care Patient      Patient will benefit from skilled therapeutic intervention in order to improve the following deficits and impairments:  Decreased range of motion, Pain, Decreased strength, Decreased activity tolerance, Impaired UE functional use, Increased muscle spasms, Decreased cognition,  Hypermobility  Visit Diagnosis: Stiffness of left shoulder, not elsewhere classified  Cramp and spasm  Left shoulder pain, unspecified chronicity  Muscle weakness (generalized)  Acute pain of left shoulder     Problem List Patient Active Problem List   Diagnosis Date Noted  . Routine general medical examination at a health care facility 04/10/2015  . PALPITATIONS 05/30/2009    [name],[name] PTA 10/02/2016, 9:18 AM  [facility] [address] [place], [place], [address] Phone: [phone]   Fax:  [phone]  Name: Kim Anderson MRN: [id number] Date of Birth: [birth year]

## 2016-10-07 ENCOUNTER — Ambulatory Visit: Payer: PRIVATE HEALTH INSURANCE

## 2016-10-07 DIAGNOSIS — R252 Cramp and spasm: Secondary | ICD-10-CM

## 2016-10-07 DIAGNOSIS — M25612 Stiffness of left shoulder, not elsewhere classified: Secondary | ICD-10-CM

## 2016-10-07 DIAGNOSIS — M6281 Muscle weakness (generalized): Secondary | ICD-10-CM | POA: Diagnosis not present

## 2016-10-07 DIAGNOSIS — M25512 Pain in left shoulder: Secondary | ICD-10-CM | POA: Diagnosis not present

## 2016-10-07 DIAGNOSIS — S42252A Displaced fracture of greater tuberosity of left humerus, initial encounter for closed fracture: Secondary | ICD-10-CM | POA: Diagnosis not present

## 2016-10-07 NOTE — Therapy (Signed)
[place], [place], [address] Phone: [phone]   Fax:  [phone]  Physical Therapy Treatment  Patient Details  Name: Kim Anderson MRN: [id number] Date of Birth: [birth year] Referring Provider: Esmond Plants, MD  Encounter Date: 10/07/2016      PT End of Session - 10/07/16 0750    Visit Number 18   Number of Visits 24   Date for PT Re-Evaluation 10/25/16   Authorization Type Workers comp   Authorization - Visit Number 18   Authorization - Number of Visits 24   PT Start Time 0750   PT Stop Time 0835   PT Time Calculation (min) 45 min   Activity Tolerance Patient tolerated treatment well   Behavior During Therapy [facility] for tasks assessed/performed      Past Medical History:  Diagnosis Date  . History of chest pain   . Mitral valve prolapse    responded to beta-blockers. no evidence of this on 2-d echo  . Palpitations     Past Surgical History:  Procedure Laterality Date  . CHOLECYSTECTOMY  1997  . COLONOSCOPY  11/02/2007   normal  . LEFT SHOULDER ACROMINOPLASTY      There were no vitals filed for this visit.      Subjective Assessment - 10/07/16 0840    Subjective 1-2 max pain. It s better and PT helping   Pain Score 1    Pain Location Shoulder   Pain Orientation Left;Anterior   Pain Descriptors / Indicators Aching   Pain Type --  sub acute   Pain Onset More than a month ago   Pain Frequency Intermittent   Aggravating Factors  Sleep LT side . reaching behind   Pain Relieving Factors rest, heat/cold   Multiple Pain Sites No                         [facility] Adult PT Treatment/Exercise - 10/07/16 0752      Therapeutic Activites    Therapeutic Activities Lifting   Lifting 30 ounds x 10 waist to head level then 40 pounds same to head height x 5 .      Shoulder Exercises: Seated   Other Seated Exercises [place] blue x 15-20 reps Then overhead  5 pounds x 12 reps,  then overhead stretch x 4 30 sec then behind back stretch 4x 30 sec,  then pushups off counter x 20 reps  then UBE x 6 min 3 for 3 back L3 to end     Shoulder Exercises: ROM/Strengthening   UBE (Upper Arm Bike) 4 minutes 2 ways    Wall Pushups 20 reps                  PT Short Term Goals - 10/02/16 0908      PT SHORT TERM GOAL #1   Title She will be independent with inital HEP   Time 3   Period Weeks   Status Achieved     PT SHORT TERM GOAL #2   Title She will improve active asssited flexion to 120 degrees    Time 4   Period Weeks   Status Achieved     PT SHORT TERM GOAL #3   Title She will improve active  assited abduciton to 110 degrees    Time 4   Period Weeks   Status Achieved     PT SHORT TERM GOAL #4   Title she will improve  acti assist ER to 85 degrees   Time 4   Period Weeks   Status Unable to assess     PT SHORT TERM GOAL #5   Title she will report pain improved with ROM and normal activity by 30% or more   Baseline Pain improved at least 30 %   Time 4   Period Weeks   Status Partially Met           PT Long Term Goals - 10/02/16 0909      PT LONG TERM GOAL #1   Title she will be independent with all HEP issued   Time 12   Period Weeks   Status On-going     PT LONG TERM GOAL #2   Title She will return to work normal duties    Baseline retrun OK by MD,  continuew in the office for the rest of this week   Time 12   Period Weeks   Status On-going     PT LONG TERM GOAL #3   Title She will improve so able to dress without asssit.    Baseline still trouble reaching behind back   Time 12   Period Weeks   Status Partially Met     PT LONG TERM GOAL #4   Title She will have 1-2/10 max pain with all activity   Time 12   Period Weeks   Status On-going     PT LONG TERM GOAL #5   Title She will be able to ligft 30 pounds from floor and to shoulder height with 2 arms to demo improved functinal strength   Baseline 28 pounds   Time 12    Period Weeks   Status Partially Met               Plan - 10/07/16 0751    Clinical Impression Statement Mrs January continues improvement with less pain with end range movements and tolerating increased resistance. She declined modalities at end of session reporting  shoulder felt good.    PT Treatment/Interventions Cryotherapy;Electrical Stimulation;Iontophoresis 68m/ml Dexamethasone;Moist Heat;Ultrasound;Patient/family education;Manual techniques;Therapeutic exercise;Dry needling;Taping   PT Next Visit Plan Continue AROM, weight bearing and band resistance  PROM with pain as guide for limitaitons  No restrictions per MD note try lifting 2 arms box incr load as comfortable.     PT Home Exercise Plan [place] yellow/red, weight bearing stabilization, reaching behind back, hor abduction stretch supine     Consulted and Agree with Plan of Care Patient      Patient will benefit from skilled therapeutic intervention in order to improve the following deficits and impairments:  Decreased range of motion, Pain, Decreased strength, Decreased activity tolerance, Impaired UE functional use, Increased muscle spasms, Decreased cognition, Hypermobility  Visit Diagnosis: Stiffness of left shoulder, not elsewhere classified  Cramp and spasm  Left shoulder pain, unspecified chronicity  Muscle weakness (generalized)     Problem List Patient Active Problem List   Diagnosis Date Noted  . Routine general medical examination at a health care facility 04/10/2015  . PALPITATIONS 05/30/2009    CDarrel Hoover PT 10/07/2016, 8:43 AM  [facility]GM[place] [address]Phone: [phone]  Fax:  [phone] Name: Kim TALAMANTEZMRN: [id number] of Birth: 506-09-1951

## 2016-10-10 ENCOUNTER — Ambulatory Visit: Payer: PRIVATE HEALTH INSURANCE | Admitting: Physical Therapy

## 2016-10-14 ENCOUNTER — Ambulatory Visit: Payer: PRIVATE HEALTH INSURANCE

## 2016-10-14 DIAGNOSIS — M25512 Pain in left shoulder: Secondary | ICD-10-CM

## 2016-10-14 DIAGNOSIS — M25612 Stiffness of left shoulder, not elsewhere classified: Secondary | ICD-10-CM | POA: Diagnosis not present

## 2016-10-14 DIAGNOSIS — R252 Cramp and spasm: Secondary | ICD-10-CM

## 2016-10-14 DIAGNOSIS — M6281 Muscle weakness (generalized): Secondary | ICD-10-CM

## 2016-10-14 DIAGNOSIS — S42252A Displaced fracture of greater tuberosity of left humerus, initial encounter for closed fracture: Secondary | ICD-10-CM | POA: Diagnosis not present

## 2016-10-14 NOTE — Therapy (Signed)
[place], [place], [address] Phone: [phone]   Fax:  [phone]  Physical Therapy Treatment  Patient Details  Name: Kim Anderson MRN: [id number] Date of Birth: 23-Jul-1951 Referring Provider: Esmond Plants, MD  Encounter Date: 10/14/2016      PT End of Session - 10/14/16 0757    Visit Number 19   Number of Visits 24   Date for PT Re-Evaluation 10/25/16   Authorization Type Workers comp   Authorization - Visit Number 19   Authorization - Number of Visits 24   PT Start Time 0750   PT Stop Time 0835   PT Time Calculation (min) 45 min   Activity Tolerance Patient tolerated treatment well   Behavior During Therapy [facility] for tasks assessed/performed      Past Medical History:  Diagnosis Date  . History of chest pain   . Mitral valve prolapse    responded to beta-blockers. no evidence of this on 2-d echo  . Palpitations     Past Surgical History:  Procedure Laterality Date  . CHOLECYSTECTOMY  1997  . COLONOSCOPY  11/02/2007   normal  . LEFT SHOULDER ACROMINOPLASTY      There were no vitals filed for this visit.      Subjective Assessment - 10/14/16 0801    Subjective No pain   Currently in Pain? No/denies   Pain Score 0-No pain            [facility] PT Assessment - 10/14/16 0001      AROM   Left Shoulder Flexion 120 Degrees   Left Shoulder ABduction 122 Degrees  stopped when arm began to move forward   Left Shoulder Internal Rotation --  hand to thoraco lumbar spine1 inch less than RT    Left Shoulder External Rotation 53 Degrees     PROM   Left Shoulder Flexion 132 Degrees   Left Shoulder ABduction 125 Degrees   Left Shoulder External Rotation 75 Degrees   with pain   Left Shoulder Horizontal ABduction 20 Degrees   Left Shoulder Horizontal ADduction 123 Degrees                     [facility] Adult PT Treatment/Exercise - 10/14/16 0802      Shoulder Exercises:  Sidelying   External Rotation Strengthening;Left;10 reps  with 5-4-3 pounds     Shoulder Exercises: Standing   Other Standing Exercises stabilization exercise arms on counter RT to LT x 12 each then overhead press 5 pounds  x15, then 15 pound kettle bell lift off flor x10  then bell 25 pounds x 8     Shoulder Exercises: ROM/Strengthening   UBE (Upper Arm Bike) 4 minutes 2 ways    Wall Pushups 20 reps     Shoulder Exercises: Body Blade   ABduction 30 seconds;3 reps;2 reps   External Rotation 30 seconds;3 reps;2 reps     Manual ROM all planes and Gr 3 mobs inferior LT shouldser    Declined modalities         PT Short Term Goals - 10/02/16 0908      PT SHORT TERM GOAL #1   Title She will be independent with inital HEP   Time 3   Period Weeks   Status Achieved     PT SHORT TERM GOAL #2   Title She will improve active asssited flexion to 120 degrees    Time 4   Period Weeks  Status Achieved     PT SHORT TERM GOAL #3   Title She will improve active  assited abduciton to 110 degrees    Time 4   Period Weeks   Status Achieved     PT SHORT TERM GOAL #4   Title she will improve acti assist ER to 85 degrees   Time 4   Period Weeks   Status Unable to assess     PT SHORT TERM GOAL #5   Title she will report pain improved with ROM and normal activity by 30% or more   Baseline Pain improved at least 30 %   Time 4   Period Weeks   Status Partially Met           PT Long Term Goals - 10/02/16 0909      PT LONG TERM GOAL #1   Title she will be independent with all HEP issued   Time 12   Period Weeks   Status On-going     PT LONG TERM GOAL #2   Title She will return to work normal duties    Baseline retrun OK by MD,  continuew in the office for the rest of this week   Time 12   Period Weeks   Status On-going     PT LONG TERM GOAL #3   Title She will improve so able to dress without asssit.    Baseline still trouble reaching behind back   Time 12    Period Weeks   Status Partially Met     PT LONG TERM GOAL #4   Title She will have 1-2/10 max pain with all activity   Time 12   Period Weeks   Status On-going     PT LONG TERM GOAL #5   Title She will be able to ligft 30 pounds from floor and to shoulder height with 2 arms to demo improved functinal strength   Baseline 28 pounds   Time 12   Period Weeks   Status Partially Met               Plan - 10/14/16 0757    Clinical Impression Statement ROM improved but still limited and may need to do more ROM /Mobs to improve this. Continue strength   PT Treatment/Interventions Cryotherapy;Electrical Stimulation;Iontophoresis 50m/ml Dexamethasone;Moist Heat;Ultrasound;Patient/family education;Manual techniques;Therapeutic exercise;Dry needling;Taping   PT Next Visit Plan Continue AROM, weight bearing and band resistance  PROM with pain as guide for limitaitons  No restrictions per MD note try lifting 2 arms box incr load as comfortable.  More manual to improve ROm   PT Home Exercise Plan [place] yellow/red, weight bearing stabilization, reaching behind back, hor abduction stretch supine     Consulted and Agree with Plan of Care Patient      Patient will benefit from skilled therapeutic intervention in order to improve the following deficits and impairments:  Decreased range of motion, Pain, Decreased strength, Decreased activity tolerance, Impaired UE functional use, Increased muscle spasms, Decreased cognition, Hypermobility  Visit Diagnosis: Stiffness of left shoulder, not elsewhere classified  Cramp and spasm  Left shoulder pain, unspecified chronicity  Muscle weakness (generalized)     Problem List Patient Active Problem List   Diagnosis Date Noted  . Routine general medical examination at a health care facility 04/10/2015  . PALPITATIONS 05/30/2009    CDarrel Hoover PT 10/14/2016, 8:43 AM  [facility] [address]Phone:  [phone]   Fax:  [phone]  Name: Kim Anderson MRN: [id number] Date of Birth: 1951/08/22

## 2016-10-17 ENCOUNTER — Ambulatory Visit: Payer: PRIVATE HEALTH INSURANCE | Admitting: Physical Therapy

## 2016-10-17 DIAGNOSIS — M25612 Stiffness of left shoulder, not elsewhere classified: Secondary | ICD-10-CM

## 2016-10-17 DIAGNOSIS — M6281 Muscle weakness (generalized): Secondary | ICD-10-CM | POA: Diagnosis not present

## 2016-10-17 DIAGNOSIS — R252 Cramp and spasm: Secondary | ICD-10-CM

## 2016-10-17 DIAGNOSIS — M25512 Pain in left shoulder: Secondary | ICD-10-CM | POA: Diagnosis not present

## 2016-10-17 DIAGNOSIS — S42252A Displaced fracture of greater tuberosity of left humerus, initial encounter for closed fracture: Secondary | ICD-10-CM | POA: Diagnosis not present

## 2016-10-17 NOTE — Therapy (Signed)
[facility] [address] [place], [place], [address] Phone: [phone]   Fax:  [phone]  Physical Therapy Treatment  Patient Details  Name: Kim SkiffLaura S Anderson MRN: [id number] Date of Birth: 05/10/1951 Referring Provider: Malon KindleSteven Norris, MD  Encounter Date: 10/17/2016      PT End of Session - 10/17/16 1003    Visit Number 20   Number of Visits 24   Date for PT Re-Evaluation 10/25/16   Authorization - Visit Number 20   Authorization - Number of Visits 24   PT Start Time 0804   PT Stop Time 0857   PT Time Calculation (min) 53 min   Activity Tolerance Patient tolerated treatment well   Behavior [facility] Therapy [facility] for tasks assessed/performed      Past Medical History:  Diagnosis Date  . History of chest pain   . Mitral valve prolapse    responded to beta-blockers. no evidence of this on 2-d echo  . Palpitations     Past Surgical History:  Procedure Laterality Date  . CHOLECYSTECTOMY  1997  . COLONOSCOPY  11/02/2007   normal  . LEFT SHOULDER ACROMINOPLASTY      There were no vitals filed for this visit.      Subjective Assessment - 10/17/16 0955    Subjective No pain,  shoulder is stiff.   Currently in Pain? No/denies   Pain Location Shoulder   Pain Orientation Right;Anterior   Pain Descriptors / Indicators --  stiff   Pain Type Acute pain   Pain Frequency Intermittent   Aggravating Factors  sleeping on left,  reaching behind   Pain Relieving Factors rest stretching   Effect of Pain on Daily Activities unable to hook bra consistantly                         [facility] Adult PT Treatment/Exercise - 10/17/16 0001      Shoulder Exercises: Standing   Flexion AROM;Other (comment)   Flexion Limitations sliding arms up wall X 5,  slides with lift off overhead, cues needed,  Alse 5 x 2 standing flexion with strap at superior shoulder (Mobilization.       Shoulder Exercises: ROM/Strengthening   UBE  (Upper Arm Bike) 4 minutes 2 way     Moist Heat Therapy   Number Minutes Moist Heat 10 Minutes   Moist Heat Location Shoulder     Manual Therapy   Joint Mobilization grade 2,3 multiple capsule angles for Flexion, abduction and ER.  soft tissue work for upper trap an pecs to improve her sitting posture                  PT Short Term Goals - 10/17/16 1006      PT SHORT TERM GOAL #1   Title She will be independent with inital HEP   Time 3   Period Weeks   Status Achieved     PT SHORT TERM GOAL #2   Title She will improve active asssited flexion to 120 degrees    Time 4   Period Weeks   Status Achieved     PT SHORT TERM GOAL #3   Title She will improve active  assited abduciton to 110 degrees    Time 4   Period Weeks   Status Achieved     PT SHORT TERM GOAL #4   Title she will improve acti assist ER to 85 degrees   Baseline Not formally measured,  Time 4   Period Weeks   Status On-going     PT SHORT TERM GOAL #5   Title she will report pain improved with ROM and normal activity by 30% or more   Time 4   Period Weeks           PT Long Term Goals - 10/17/16 1008      PT LONG TERM GOAL #1   Title she will be independent with all HEP issued   Time 12   Period Weeks   Status On-going     PT LONG TERM GOAL #2   Title She will return to work normal duties    Time 12   Period Weeks   Status Unable to assess     PT LONG TERM GOAL #3   Title She will improve so able to dress without asssit.    Baseline still trouble reaching behind back,  intermittantly can hook bra with a lot of pain.   Time 12   Period Weeks   Status On-going     PT LONG TERM GOAL #4   Title She will have 1-2/10 max pain with all activity   Baseline varies but generally no more than 2/10   Time 12   Period Weeks   Status On-going     PT LONG TERM GOAL #5   Title She will be able to ligft 30 pounds from floor and to shoulder height with 2 arms to demo improved functinal  strength   Time 12   Period Weeks   Status Unable to assess               Plan - 10/17/16 1005    Clinical Impression Statement Increased more ROM focus today with increased ROM noted especially ER in neutral to WNL.  138 flexion and 126 abduction (Some substitution)  No pain at end of session   PT Next Visit Plan Continue AROM, weight bearing and band resistance  PROM with pain as guide for limitaitons  No restrictions per MD note try lifting 2 arms box incr load as comfortable.  More manual to improve ROm   PT Home Exercise Plan [place] yellow/red, weight bearing stabilization, reaching behind back, hor abduction stretch supine     Consulted and Agree with Plan of Care Patient      Patient will benefit from skilled therapeutic intervention in order to improve the following deficits and impairments:  Decreased range of motion, Pain, Decreased strength, Decreased activity tolerance, Impaired UE functional use, Increased muscle spasms, Decreased cognition, Hypermobility  Visit Diagnosis: Stiffness of left shoulder, not elsewhere classified  Cramp and spasm  Left shoulder pain, unspecified chronicity  Muscle weakness (generalized)  Acute pain of left shoulder     Problem List Patient Active Problem List   Diagnosis Date Noted  . Routine general medical examination at a health care facility 04/10/2015  . PALPITATIONS 05/30/2009    [name] PTA 10/17/2016, 10:10 AM  [facility] [address] [place], [place], [address] Phone: [phone]   Fax:  [phone]  Name: Kim SkiffLaura S Anderson MRN: [id number] Date of Birth: 10/15/1951

## 2016-10-21 ENCOUNTER — Ambulatory Visit: Payer: PRIVATE HEALTH INSURANCE

## 2016-10-21 DIAGNOSIS — M6281 Muscle weakness (generalized): Secondary | ICD-10-CM | POA: Diagnosis not present

## 2016-10-21 DIAGNOSIS — M25512 Pain in left shoulder: Secondary | ICD-10-CM

## 2016-10-21 DIAGNOSIS — S42252A Displaced fracture of greater tuberosity of left humerus, initial encounter for closed fracture: Secondary | ICD-10-CM | POA: Diagnosis not present

## 2016-10-21 DIAGNOSIS — M25612 Stiffness of left shoulder, not elsewhere classified: Secondary | ICD-10-CM | POA: Diagnosis not present

## 2016-10-21 DIAGNOSIS — R252 Cramp and spasm: Secondary | ICD-10-CM | POA: Diagnosis not present

## 2016-10-21 NOTE — Therapy (Signed)
[facility] [address] [place], [place], [address] Phone: [phone]   Fax:  [phone]  Physical Therapy Treatment  Patient Details  Name: Kim SkiffLaura S Anderson MRN: [id number] Date of Birth: 10/20/1951 Referring Provider: Malon KindleSteven Norris, MD  Encounter Date: 10/21/2016      PT End of Session - 10/21/16 0753    Visit Number 21   Number of Visits 24   Date for PT Re-Evaluation 10/25/16   Authorization Type Workers comp   Authorization - Visit Number 21   Authorization - Number of Visits 24   PT Start Time 0750   PT Stop Time 0835   PT Time Calculation (min) 45 min   Activity Tolerance Patient tolerated treatment well   Behavior During Therapy [facility] for tasks assessed/performed      Past Medical History:  Diagnosis Date  . History of chest pain   . Mitral valve prolapse    responded to beta-blockers. no evidence of this on 2-d echo  . Palpitations     Past Surgical History:  Procedure Laterality Date  . CHOLECYSTECTOMY  1997  . COLONOSCOPY  11/02/2007   normal  . LEFT SHOULDER ACROMINOPLASTY      There were no vitals filed for this visit.                       [facility] Adult PT Treatment/Exercise - 10/21/16 0800      Shoulder Exercises: Supine   Other Supine Exercises AROM with assist end range stretch x 20 flexion and ER      Shoulder Exercises: Standing   Other Standing Exercises [place] blue x 20 4 way     Shoulder Exercises: ROM/Strengthening   UBE (Upper Arm Bike) 4 minutes 2 way     Manual Therapy   Joint Mobilization grade 2,3 multiple capsule angles for Flexion, abduction and ER.  soft tissue work for upper trap an pecs to improve her sitting posture   Passive ROM flexion and rottion x 25 reps 10-20 seconds                  PT Short Term Goals - 10/17/16 1006      PT SHORT TERM GOAL #1   Title She will be independent with inital HEP   Time 3   Period Weeks   Status  Achieved     PT SHORT TERM GOAL #2   Title She will improve active asssited flexion to 120 degrees    Time 4   Period Weeks   Status Achieved     PT SHORT TERM GOAL #3   Title She will improve active  assited abduciton to 110 degrees    Time 4   Period Weeks   Status Achieved     PT SHORT TERM GOAL #4   Title she will improve acti assist ER to 85 degrees   Baseline Not formally measured,     Time 4   Period Weeks   Status On-going     PT SHORT TERM GOAL #5   Title she will report pain improved with ROM and normal activity by 30% or more   Time 4   Period Weeks           PT Long Term Goals - 10/17/16 1008      PT LONG TERM GOAL #1   Title she will be independent with all HEP issued   Time 12   Period Weeks  Status On-going     PT LONG TERM GOAL #2   Title She will return to work normal duties    Time 12   Period Weeks   Status Unable to assess     PT LONG TERM GOAL #3   Title She will improve so able to dress without asssit.    Baseline still trouble reaching behind back,  intermittantly can hook bra with a lot of pain.   Time 12   Period Weeks   Status On-going     PT LONG TERM GOAL #4   Title She will have 1-2/10 max pain with all activity   Baseline varies but generally no more than 2/10   Time 12   Period Weeks   Status On-going     PT LONG TERM GOAL #5   Title She will be able to ligft 30 pounds from floor and to shoulder height with 2 arms to demo improved functinal strength   Time 12   Period Weeks   Status Unable to assess               Plan - 10/21/16 0753    Clinical Impression Statement tolerating end range stretching thiough continues to be painful. Her ROM improves as we stretch with less pain. Hor abduction with ER still limited but improving.   PT Treatment/Interventions Cryotherapy;Electrical Stimulation;Iontophoresis 4mg /ml Dexamethasone;Moist Heat;Ultrasound;Patient/family education;Manual techniques;Therapeutic  exercise;Dry needling;Taping   PT Next Visit Plan Continue AROM, weight bearing and band resistance  PROM with pain as guide for limitaitons  No restrictions per MD note .try lifting 2 .  Cont  manual to improve ROM   PT Home Exercise Plan [place]wood yellow/red, weight bearing stabilization, reaching behind back, hor abduction stretch supine     Consulted and Agree with Plan of Care Patient      Patient will benefit from skilled therapeutic intervention in order to improve the following deficits and impairments:  Decreased range of motion, Pain, Decreased strength, Decreased activity tolerance, Impaired UE functional use, Increased muscle spasms, Decreased cognition, Hypermobility  Visit Diagnosis: Stiffness of left shoulder, not elsewhere classified  Cramp and spasm  Left shoulder pain, unspecified chronicity  Muscle weakness (generalized)  Displaced fracture of greater tuberosity of left humerus, initial encounter for closed fracture     Problem List Patient Active Problem List   Diagnosis Date Noted  . Routine general medical examination at a health care facility 04/10/2015  . PALPITATIONS 05/30/2009    Caprice RedChasse, Chantil Bari M  PT 10/21/2016, 8:36 AM  [facility] [address] [place], [place], [address] Phone: [phone]   Fax:  [phone]  Name: Kim SkiffLaura S Anderson MRN: [id number] Date of Birth: 07/26/1951

## 2016-10-23 ENCOUNTER — Ambulatory Visit: Payer: PRIVATE HEALTH INSURANCE

## 2016-10-23 DIAGNOSIS — M25512 Pain in left shoulder: Secondary | ICD-10-CM | POA: Diagnosis not present

## 2016-10-23 DIAGNOSIS — R252 Cramp and spasm: Secondary | ICD-10-CM

## 2016-10-23 DIAGNOSIS — S42252A Displaced fracture of greater tuberosity of left humerus, initial encounter for closed fracture: Secondary | ICD-10-CM

## 2016-10-23 DIAGNOSIS — M25612 Stiffness of left shoulder, not elsewhere classified: Secondary | ICD-10-CM

## 2016-10-23 DIAGNOSIS — M6281 Muscle weakness (generalized): Secondary | ICD-10-CM | POA: Diagnosis not present

## 2016-10-23 NOTE — Therapy (Signed)
[place], [place], [address] Phone: [phone]   Fax:  [phone]  Physical Therapy Treatment  Patient Details  Name: Kim Anderson MRN: [id number] Date of Birth: March 27, 1951 Referring Provider: Esmond Plants, MD  Encounter Date: 10/23/2016      PT End of Session - 10/23/16 0752    Visit Number 22   Number of Visits 24   Date for PT Re-Evaluation 10/25/16   Authorization Type Workers comp   Authorization - Visit Number 21   Authorization - Number of Visits 24   PT Start Time [phone]   PT Stop Time 0835   PT Time Calculation (min) 43 min   Activity Tolerance Patient tolerated treatment well   Behavior During Therapy [facility] for tasks assessed/performed      Past Medical History:  Diagnosis Date  . History of chest pain   . Mitral valve prolapse    responded to beta-blockers. no evidence of this on 2-d echo  . Palpitations     Past Surgical History:  Procedure Laterality Date  . CHOLECYSTECTOMY  1997  . COLONOSCOPY  11/02/2007   normal  . LEFT SHOULDER ACROMINOPLASTY      There were no vitals filed for this visit.      Subjective Assessment - 10/23/16 0753    Subjective No pain,  shoulder is stiff.   Currently in Pain? No/denies                         [facility] Adult PT Treatment/Exercise - 10/23/16 0001      Shoulder Exercises: Standing   Flexion Both;Strengthening;15 reps   Shoulder Flexion Weight (lbs) 2   ABduction Strengthening;Both;15 reps   Shoulder ABduction Weight (lbs) 2   Other Standing Exercises bicep curls x 15 5 pounds, kettle bell 35 pounds bent row x 15 , then tricep curls 5 pounds x 12 reps     Shoulder Exercises: ROM/Strengthening   UBE (Upper Arm Bike) 4 minutes 2 way     Manual Therapy   Joint Mobilization grade 2,3 multiple capsule angles for Flexion, abduction and ER.  soft tissue work for upper trap an pecs to improve her sitting posture    Passive ROM flexion and rottion x 10 reps 10-20 seconds                  PT Short Term Goals - 10/23/16 0843      PT SHORT TERM GOAL #1   Title She will be independent with inital HEP     PT SHORT TERM GOAL #2   Title She will improve active asssited flexion to 120 degrees    Status Achieved     PT SHORT TERM GOAL #3   Title She will improve active  assited abduciton to 110 degrees    Status Achieved     PT SHORT TERM GOAL #4   Title she will improve acti assist ER to 85 degrees   Baseline She can get to 85 degres  long as humerous in scaption   Status Achieved     PT SHORT TERM GOAL #5   Title she will report pain improved with ROM and normal activity by 30% or more   Status Achieved           PT Long Term Goals - 10/23/16 0844      PT LONG TERM GOAL #1   Title she will be independent with  all HEP issued   Status On-going     PT LONG TERM GOAL #2   Title She will return to work normal duties    Baseline Not lifting /moving patients, or pushing beds   Status Partially Met     PT LONG TERM GOAL #3   Title She will improve so able to dress without asssit.    Baseline still trouble reaching behind back,  intermittantly can hook bra with a lot of pain.   Status On-going     PT LONG TERM GOAL #4   Title She will have 1-2/10 max pain with all activity   Baseline varies but generally no more than 2/10   Status On-going     PT LONG TERM GOAL #5   Title She will be able to ligft 30 pounds from floor and to shoulder height with 2 arms to demo improved functinal strength   Status Unable to assess               Plan - 10/23/16 0839    Clinical Impression Statement Passively it appears to be improving with ER about 80 degrees with shoulder at 90 degrees abduction. Rarely does she have sharp pains and pain with stretching is mostly on release of tension.  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  Cont  manual to improve ROM   Measure ROM   PT Home Exercise Plan [place] yellow/red, weight bearing stabilization, reaching behind back, hor abduction stretch supine     Consulted and Agree with Plan of Care Patient      Patient will benefit from skilled therapeutic intervention in order to improve the following deficits and impairments:  Decreased range of motion, Pain, Decreased strength, Decreased activity tolerance, Impaired UE functional use, Increased muscle spasms, Decreased cognition, Hypermobility  Visit Diagnosis: Displaced fracture of greater tuberosity of left humerus, initial encounter for closed fracture  Stiffness of left shoulder, not elsewhere classified  Cramp and spasm  Left shoulder pain, unspecified chronicity  Muscle weakness (generalized)     Problem List Patient Active Problem List   Diagnosis Date Noted  . Routine general medical examination at a health care facility 04/10/2015  . PALPITATIONS 05/30/2009    CDarrel Hoover PT 10/23/2016, 8:48 AM  [facility]18[address]end[address] StreetGAncient [place] [address]Phone: [phone]  Fax:  [phone] Name: LKARLYN GLASCOMRN: [id number] of Birth: 531-May-1952

## 2016-10-28 ENCOUNTER — Ambulatory Visit: Payer: PRIVATE HEALTH INSURANCE

## 2016-10-28 DIAGNOSIS — S42252A Displaced fracture of greater tuberosity of left humerus, initial encounter for closed fracture: Secondary | ICD-10-CM | POA: Diagnosis not present

## 2016-10-28 DIAGNOSIS — M6281 Muscle weakness (generalized): Secondary | ICD-10-CM

## 2016-10-28 DIAGNOSIS — M25512 Pain in left shoulder: Secondary | ICD-10-CM | POA: Diagnosis not present

## 2016-10-28 DIAGNOSIS — R252 Cramp and spasm: Secondary | ICD-10-CM | POA: Diagnosis not present

## 2016-10-28 DIAGNOSIS — M25612 Stiffness of left shoulder, not elsewhere classified: Secondary | ICD-10-CM

## 2016-10-28 NOTE — Therapy (Addendum)
[place], [place], [address] Phone: [phone]   Fax:  [phone]  Physical Therapy Treatment  Patient Details  Name: Kim Anderson MRN: [id number] Date of Birth: January 13, 1951 Referring Provider: Esmond Plants, MD  Encounter Date: 10/28/2016      PT End of Session - 10/28/16 0812    Visit Number 23   Number of Visits 24   Date for PT Re-Evaluation 10/25/16   Authorization Type Workers comp   Authorization - Visit Number 23   Authorization - Number of Visits 24   PT Start Time 2563   PT Stop Time 0835   PT Time Calculation (min) 40 min   Activity Tolerance Patient tolerated treatment well   Behavior During Therapy [facility] for tasks assessed/performed      Past Medical History:  Diagnosis Date  . History of chest pain   . Mitral valve prolapse    responded to beta-blockers. no evidence of this on 2-d echo  . Palpitations     Past Surgical History:  Procedure Laterality Date  . CHOLECYSTECTOMY  1997  . COLONOSCOPY  11/02/2007   normal  . LEFT SHOULDER ACROMINOPLASTY      There were no vitals filed for this visit.      Subjective Assessment - 10/28/16 0805    Subjective No complaints   Currently in Pain? No/denies                         [facility] Adult PT Treatment/Exercise - 10/28/16 0001      Therapeutic Activites    Therapeutic Activities Work [employer]   Work [employer] push /pull work sled mimic moving bed   75 feet x 10  40 pounds force and she felt this was more resistance than bed is normally. Worked on getting off floor using LT arm for full weight bearing     Shoulder Exercises: Standing   Other Standing Exercises overhead lift 5 pounds x 10, 7 pounds x 10,  8 pounds x 3 , 9 pounds x 5 , 10 pounds 5 reps then reported fatigue                  PT Short Term Goals - 10/23/16 8937      PT SHORT TERM GOAL #1   Title She will be independent with inital  HEP     PT SHORT TERM GOAL #2   Title She will improve active asssited flexion to 120 degrees    Status Achieved     PT SHORT TERM GOAL #3   Title She will improve active  assited abduciton to 110 degrees    Status Achieved     PT SHORT TERM GOAL #4   Title she will improve acti assist ER to 85 degrees   Baseline She can get to 85 degres  long as humerous in scaption   Status Achieved     PT SHORT TERM GOAL #5   Title she will report pain improved with ROM and normal activity by 30% or more   Status Achieved           PT Long Term Goals - 10/23/16 0844      PT LONG TERM GOAL #1   Title she will be independent with all HEP issued   Status On-going     PT LONG TERM GOAL #2   Title She will return to work normal duties  Baseline Not lifting /moving patients, or pushing beds   Status Partially Met     PT LONG TERM GOAL #3   Title She will improve so able to dress without asssit.    Baseline still trouble reaching behind back,  intermittantly can hook bra with a lot of pain.   Status On-going     PT LONG TERM GOAL #4   Title She will have 1-2/10 max pain with all activity   Baseline varies but generally no more than 2/10   Status On-going     PT LONG TERM GOAL #5   Title She will be able to ligft 30 pounds from floor and to shoulder height with 2 arms to demo improved functinal strength   Status Unable to assess               Plan - 10/28/16 0841    Clinical Impression Statement Ms Woodson has progressed with strength and function but is still not doing all normal activity at work and decr ROM causes pain at times and some difficulty with activity as example hooking bra behind back.  She would benefit from extended PT for 6 more visits after this week  to max ROM and any functional tasks for home and work.    PT Treatment/Interventions Cryotherapy;Electrical Stimulation;Iontophoresis 75m/ml Dexamethasone;Moist Heat;Ultrasound;Patient/family education;Manual  techniques;Therapeutic exercise;Dry needling;Taping   PT Next Visit Plan Measure ROM , set lifting for home exercise and ROM HEP   PT Home Exercise Plan [place] yellow/red, weight bearing stabilization, reaching behind back, hor abduction stretch supine     Consulted and Agree with Plan of Care Patient      Patient will benefit from skilled therapeutic intervention in order to improve the following deficits and impairments:  Decreased range of motion, Pain, Decreased strength, Decreased activity tolerance, Impaired UE functional use, Increased muscle spasms, Decreased cognition, Hypermobility  Visit Diagnosis: Displaced fracture of greater tuberosity of left humerus, initial encounter for closed fracture  Stiffness of left shoulder, not elsewhere classified  Cramp and spasm  Left shoulder pain, unspecified chronicity  Muscle weakness (generalized)     Problem List Patient Active Problem List   Diagnosis Date Noted  . Routine general medical examination at a health care facility 04/10/2015  . PALPITATIONS 05/30/2009    CDarrel Hoover PT 10/28/2016, 8:54 AM  [facility] Hospital186 E. Hanover AvenueGEva N[place] [address]Phone: [phone]  Fax:  [phone] Name: Kim NASHMRN: [id number] of Birth: 501-16-1952

## 2016-10-31 ENCOUNTER — Ambulatory Visit: Payer: PRIVATE HEALTH INSURANCE | Admitting: Physical Therapy

## 2016-10-31 DIAGNOSIS — M6281 Muscle weakness (generalized): Secondary | ICD-10-CM | POA: Diagnosis not present

## 2016-10-31 DIAGNOSIS — S42252A Displaced fracture of greater tuberosity of left humerus, initial encounter for closed fracture: Secondary | ICD-10-CM

## 2016-10-31 DIAGNOSIS — M25512 Pain in left shoulder: Secondary | ICD-10-CM | POA: Diagnosis not present

## 2016-10-31 DIAGNOSIS — M25612 Stiffness of left shoulder, not elsewhere classified: Secondary | ICD-10-CM | POA: Diagnosis not present

## 2016-10-31 DIAGNOSIS — R252 Cramp and spasm: Secondary | ICD-10-CM | POA: Diagnosis not present

## 2016-10-31 NOTE — Therapy (Signed)
[facility] [address] [place], [place], [address] Phone: [phone]   Fax:  [phone]  Physical Therapy Treatment  Patient Details  Name: Kim SkiffLaura S Anderson MRN: [id number] Date of Birth: 04/30/1951 Referring Provider: Malon KindleSteven Norris, MD  Encounter Date: 10/31/2016      PT End of Session - 10/31/16 1002    Visit Number 24   Number of Visits 24   Date for PT Re-Evaluation 10/25/16   Authorization - Visit Number 24   Authorization - Number of Visits 24   PT Start Time 0805   PT Stop Time 0900   PT Time Calculation (min) 55 min   Activity Tolerance Patient tolerated treatment well   Behavior During Therapy [facility] for tasks assessed/performed      Past Medical History:  Diagnosis Date  . History of chest pain   . Mitral valve prolapse    responded to beta-blockers. no evidence of this on 2-d echo  . Palpitations     Past Surgical History:  Procedure Laterality Date  . CHOLECYSTECTOMY  1997  . COLONOSCOPY  11/02/2007   normal  . LEFT SHOULDER ACROMINOPLASTY      There were no vitals filed for this visit.      Subjective Assessment - 10/31/16 0953    Subjective See's MD Tuesday.  No pain , some soreness.   Currently in Pain? No/denies   Pain Score 0-No pain   Pain Location Shoulder   Pain Orientation Right   Pain Frequency Intermittent   Aggravating Factors  reaching behind, Sleeping on left   Pain Relieving Factors rest, ice, stretch   Effect of Pain on Daily Activities ADL's difficult behind back            [facility] PT Assessment - 10/31/16 0001      AROM   Left Shoulder Flexion 140 Degrees   Left Shoulder ABduction 130 Degrees   Left Shoulder Internal Rotation --  touches 1/2 inch below bra                     [facility] Adult PT Treatment/Exercise - 10/31/16 0001      Shoulder Exercises: Pulleys   Flexion --  5 minutes for stretch end range.     Shoulder Exercises: ROM/Strengthening    UBE (Upper Arm Bike) L4, 4 minutes 2 Way   Other ROM/Strengthening Exercises HEP barbells flexion, scaption and abduction 10 X each 3,5,5LBS.  Leaning on counter row and extension 10 x eaxh, 3,5,6 LBS     Moist Heat Therapy   Number Minutes Moist Heat 10 Minutes   Moist Heat Location Shoulder     Manual Therapy   Joint Mobilization grade2,  with distraction for flexion/ abduction.   Passive ROM Left shoulder                PT Education - 10/31/16 1002    Education provided Yes   Education Details HEP   Person(s) Educated Patient   Methods Explanation;Demonstration;Tactile cues;Verbal cues;Handout   Comprehension Verbalized understanding;Returned demonstration          PT Short Term Goals - 10/23/16 0843      PT SHORT TERM GOAL #1   Title She will be independent with inital HEP     PT SHORT TERM GOAL #2   Title She will improve active asssited flexion to 120 degrees    Status Achieved     PT SHORT TERM GOAL #3   Title She  will improve active  assited abduciton to 110 degrees    Status Achieved     PT SHORT TERM GOAL #4   Title she will improve acti assist ER to 85 degrees   Baseline She can get to 85 degres  long as humerous in scaption   Status Achieved     PT SHORT TERM GOAL #5   Title she will report pain improved with ROM and normal activity by 30% or more   Status Achieved           PT Long Term Goals - 10/31/16 1007      PT LONG TERM GOAL #1   Title she will be independent with all HEP issued   Baseline independent with exercises issued so far   Time 12   Period Weeks   Status On-going     PT LONG TERM GOAL #2   Title She will return to work normal duties    Time 12   Period Weeks   Status Unable to assess     PT LONG TERM GOAL #3   Title She will improve so able to dress without asssit.    Baseline still trouble reaching behind back,  intermittantly can hook bra with a lot of pain.   Time 12   Period Weeks     PT LONG TERM GOAL #4    Title She will have 1-2/10 max pain with all activity   Baseline 12   Time 12   Period Weeks   Status On-going     PT LONG TERM GOAL #5   Title She will be able to ligft 30 pounds from floor and to shoulder height with 2 arms to demo improved functinal strength   Time 12   Period Weeks   Status Unable to assess               Plan - 10/31/16 1003    Clinical Impression Statement ROM: 140 flex, 130 abduction left shoulder.  progress toward HEP goals.  Patient's range continues to be limited for ADL's behing her back.    PT Next Visit Plan review weight exercises manual as required to increase ROM.  Await more visits to continue.   PT Home Exercise Plan [place] yellow/red, weight bearing stabilization, reaching behind back, hor abduction stretch supine  11/30: weights PRE   Consulted and Agree with Plan of Care Patient      Patient will benefit from skilled therapeutic intervention in order to improve the following deficits and impairments:  Decreased range of motion, Pain, Decreased strength, Decreased activity tolerance, Impaired UE functional use, Increased muscle spasms, Decreased cognition, Hypermobility  Visit Diagnosis: Displaced fracture of greater tuberosity of left humerus, initial encounter for closed fracture  Stiffness of left shoulder, not elsewhere classified  Cramp and spasm  Left shoulder pain, unspecified chronicity  Muscle weakness (generalized)  Acute pain of left shoulder     Problem List Patient Active Problem List   Diagnosis Date Noted  . Routine general medical examination at a health care facility 04/10/2015  . PALPITATIONS 05/30/2009    [name],[name] PTA 10/31/2016, 10:09 AM  [facility] [address] [place], [place], [address] Phone: [phone]   Fax:  [phone]  Name: Kim SkiffLaura S Anderson MRN: [id number] Date of Birth: 02/09/1951

## 2016-10-31 NOTE — Patient Instructions (Signed)
Hand drawn exercises with hand weight  3,5,6 LBS 10 x each, 2-3 x a week Flexion, abduction, scaption Leaning on counter: extension, rows.

## 2016-11-04 ENCOUNTER — Ambulatory Visit: Payer: PRIVATE HEALTH INSURANCE

## 2016-11-04 ENCOUNTER — Other Ambulatory Visit: Payer: Self-pay | Admitting: Internal Medicine

## 2016-11-04 MED FILL — METOPROLOL SUCC ER 25 MG TA: 25 | 90 days supply | Qty: 90 | Fill #2

## 2016-11-04 MED FILL — CALCIUM 600 + VIT D 400 TAB: 600-400 | 150 days supply | Qty: 150 | Fill #0

## 2016-11-04 MED FILL — FISH OIL 1,000 MG CAPSULE: 1000 | 100 days supply | Qty: 100 | Fill #0

## 2016-11-04 MED FILL — ASPIR-LOW EC 81 MG TABLET: 81 | 90 days supply | Qty: 90 | Fill #2

## 2016-11-05 ENCOUNTER — Other Ambulatory Visit: Payer: Self-pay | Admitting: *Deleted

## 2016-11-05 MED ORDER — B COMPLEX PO TABS: 1.0000 | ORAL_TABLET | Freq: Every day | ORAL | 1 refills | Status: DC

## 2016-11-05 MED FILL — B COMPLEX TABLET: 100 days supply | Qty: 100 | Fill #0

## 2016-11-12 ENCOUNTER — Ambulatory Visit: Payer: PRIVATE HEALTH INSURANCE | Attending: Internal Medicine

## 2016-11-12 DIAGNOSIS — M25612 Stiffness of left shoulder, not elsewhere classified: Secondary | ICD-10-CM | POA: Insufficient documentation

## 2016-11-12 DIAGNOSIS — M25512 Pain in left shoulder: Secondary | ICD-10-CM | POA: Diagnosis present

## 2016-11-12 DIAGNOSIS — S42252A Displaced fracture of greater tuberosity of left humerus, initial encounter for closed fracture: Secondary | ICD-10-CM | POA: Insufficient documentation

## 2016-11-12 DIAGNOSIS — R252 Cramp and spasm: Secondary | ICD-10-CM | POA: Insufficient documentation

## 2016-11-12 DIAGNOSIS — M6281 Muscle weakness (generalized): Secondary | ICD-10-CM | POA: Insufficient documentation

## 2016-11-12 NOTE — Therapy (Signed)
[place], [place], [address] Phone: [phone]   Fax:  [phone]  Physical Therapy Treatment  Patient Details  Name: Kim Anderson MRN: [id number] Date of Birth: Oct 30, 1951 Referring Provider: Esmond Plants, MD  Encounter Date: 11/12/2016      PT End of Session - 11/12/16 1606    Visit Number 25   Number of Visits 32   Date for PT Re-Evaluation 12/20/16   Authorization Type Workers comp   PT Start Time 1225   PT Stop Time 1315   PT Time Calculation (min) 50 min   Activity Tolerance Patient tolerated treatment well   Behavior During Therapy [facility]FL for tasks assessed/performed      Past Medical History:  Diagnosis Date  . History of chest pain   . Mitral valve prolapse    responded to beta-blockers. no evidence of this on 2-d echo  . Palpitations     Past Surgical History:  Procedure Laterality Date  . CHOLECYSTECTOMY  1997  . COLONOSCOPY  11/02/2007   normal  . LEFT SHOULDER ACROMINOPLASTY      There were no vitals filed for this visit.      Subjective Assessment - 11/12/16 1315    Subjective MD reports bone healed and wants contued Pt and progress to HEP. She reports stil slightly favoring Lt arm but is doing all normal activity at work and home. Difficult to clip bra and do tasks behind back and to do overhead activity due to stiffness. She still has some cramping of muscles in upper back due to overuse working against stiffness.    Patient Stated Goals Return to normal use of LT arm and return to full duty work and home tasks   Currently in Pain? No/denies            [facility] PT Assessment - 11/12/16 0001      ROM / Strength   AROM / PROM / Strength Strength     AROM   Left Shoulder Flexion 132 Degrees   Left Shoulder ABduction 140 Degrees   Left Shoulder Internal Rotation --  she is 5 inches less than RT hand behind back   Left Shoulder External Rotation 67 Degrees     Strength   Overall Strength Within functional limits for tasks performed                     [facility] Adult PT Treatment/Exercise - 11/12/16 0001      Shoulder Exercises: Standing   Other Standing Exercises [place] blue band x 20 reps, ovehead lift,  body weight with short pushups x 10 off mat and single arm plank off mat x 10  discussed possible need to be able to support body weight with LT arm to be fully functinal                  PT Short Term Goals - 10/23/16 0843      PT SHORT TERM GOAL #1   Title She will be independent with inital HEP     PT SHORT TERM GOAL #2   Title She will improve active asssited flexion to 120 degrees    Status Achieved     PT SHORT TERM GOAL #3   Title She will improve active  assited abduciton to 110 degrees    Status Achieved     PT SHORT TERM GOAL #4   Title she will improve acti assist ER to  85 degrees   Baseline She can get to 85 degres  long as humerous in scaption   Status Achieved     PT SHORT TERM GOAL #5   Title she will report pain improved with ROM and normal activity by 30% or more   Status Achieved           PT Long Term Goals - 11/12/16 1613      PT LONG TERM GOAL #1   Title she will be independent with all HEP issued   Time 6   Status On-going     PT LONG TERM GOAL #2   Title She will return to work normal duties    Baseline still favors LT arm   Time 6   Status Partially Met     PT LONG TERM GOAL #3   Title She will improve so able to dress without asssit.    Baseline still trouble reaching behind back,  intermittantly can hook bra    Time 6   Status Partially Met     PT LONG TERM GOAL #4   Title She will have 1-2/10 max pain with all activity   Status Achieved     PT LONG TERM GOAL #5   Title She will be able to ligft 30 pounds from floor and to shoulder height with 2 arms to demo improved functinal strength   Baseline 28 pounds   Time 6   Status On-going     Additional Long Term  Goals   Additional Long Term Goals Yes     PT LONG TERM GOAL #6   Title She will be able to get off floor with LT arm support with 1-2 max pain   Time 6   Period Weeks   Status New               Plan - 11/12/16 1607    Clinical Impression Statement [name] ROM continues to be primary limitation making reaching behind back for bra and dressing and reaching overhead activity difficult .Marland Kitchen Pain in upper back muscles may be related to overuse due to decr ROM.  Her strength is WNL except for more stenuous activity such as full body weight as in pushing self off floor with LT arm.   To improve function continue TP to improve ROm and work on loaded body weight strengthening would be beneficial.   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 and limted availability of slots her due to holidays    PT Duration --  8 weeks   PT Treatment/Interventions Cryotherapy;Electrical Stimulation;Iontophoresis 57m/ml Dexamethasone;Moist Heat;Ultrasound;Patient/family education;Manual techniques;Therapeutic exercise;Dry needling;Taping   PT Next Visit Plan Manual and stretching , body weight loaded strengthening   PT Home Exercise Plan [place] yellow/red, weight bearing stabilization, reaching behind back, hor abduction stretch supine  11/30: weights PRE  now has blue and black band   Consulted and Agree with Plan of Care Patient      Patient will benefit from skilled therapeutic intervention in order to improve the following deficits and impairments:  Decreased range of motion, Pain, Decreased strength, Decreased activity tolerance, Impaired UE functional use  Visit Diagnosis: Displaced fracture of greater tuberosity of left humerus, initial encounter for closed fracture - Plan: PT plan of care cert/re-cert  Stiffness of left shoulder, not elsewhere classified - Plan:  PT plan of care  cert/re-cert  Left shoulder pain, unspecified chronicity - Plan: PT plan of care cert/re-cert     Problem List Patient Active Problem List   Diagnosis Date Noted  . Routine general medical examination at a health care facility 04/10/2015  . PALPITATIONS 05/30/2009    Darrel Hoover  PT 11/12/2016, 4:20 PM  [facility] [address] [place], [place], [address] Phone: [phone]   Fax:  [phone]  Name: Kim Anderson MRN: [id number] Date of Birth: [birth year]

## 2016-11-13 ENCOUNTER — Other Ambulatory Visit: Payer: Self-pay | Admitting: Internal Medicine

## 2016-11-13 DIAGNOSIS — Z1231 Encounter for screening mammogram for malignant neoplasm of breast: Secondary | ICD-10-CM

## 2016-11-18 ENCOUNTER — Ambulatory Visit: Payer: PRIVATE HEALTH INSURANCE

## 2016-11-18 DIAGNOSIS — M25612 Stiffness of left shoulder, not elsewhere classified: Secondary | ICD-10-CM

## 2016-11-18 DIAGNOSIS — S42252A Displaced fracture of greater tuberosity of left humerus, initial encounter for closed fracture: Secondary | ICD-10-CM | POA: Diagnosis not present

## 2016-11-18 DIAGNOSIS — R252 Cramp and spasm: Secondary | ICD-10-CM

## 2016-11-18 DIAGNOSIS — M25512 Pain in left shoulder: Secondary | ICD-10-CM

## 2016-11-18 NOTE — Therapy (Signed)
[place], [place], [address] Phone: [phone]   Fax:  [phone]  Physical Therapy Treatment  Patient Details  Name: Kim Anderson MRN: [id number] Date of Birth: 15-Dec-1950 Referring Provider: Esmond Plants, MD  Encounter Date: 11/18/2016      PT End of Session - 11/18/16 0853    Visit Number 26   Number of Visits 32   Date for PT Re-Evaluation 12/20/16   Authorization Type Workers comp   Authorization - Visit Number 25   PT Start Time 0845   PT Stop Time 0930   PT Time Calculation (min) 45 min   Activity Tolerance Patient tolerated treatment well   Behavior During Therapy [facility] for tasks assessed/performed      Past Medical History:  Diagnosis Date  . History of chest pain   . Mitral valve prolapse    responded to beta-blockers. no evidence of this on 2-d echo  . Palpitations     Past Surgical History:  Procedure Laterality Date  . CHOLECYSTECTOMY  1997  . COLONOSCOPY  11/02/2007   normal  . LEFT SHOULDER ACROMINOPLASTY      There were no vitals filed for this visit.      Subjective Assessment - 11/18/16 0853    Subjective PAin with stretching otherwise no pain.    Currently in Pain? No/denies                         [facility] Adult PT Treatment/Exercise - 11/18/16 0001      Shoulder Exercises: ROM/Strengthening   UBE (Upper Arm Bike) L4, 4 minutes 2 Way     Cryotherapy   Number Minutes Cryotherapy 12 Minutes   Cryotherapy Location Shoulder   Type of Cryotherapy Ice pack     Manual Therapy   Joint Mobilization grade 3-4,  with distraction for flexion/ abduction.   Passive ROM Left shoulder: FLexion , ER, IR , hor abduction,   20-30 sec stretching                  PT Short Term Goals - 10/23/16 0843      PT SHORT TERM GOAL #1   Title She will be independent with inital HEP     PT SHORT TERM GOAL #2   Title She will improve active asssited  flexion to 120 degrees    Status Achieved     PT SHORT TERM GOAL #3   Title She will improve active  assited abduciton to 110 degrees    Status Achieved     PT SHORT TERM GOAL #4   Title she will improve acti assist ER to 85 degrees   Baseline She can get to 85 degres  long as humerous in scaption   Status Achieved     PT SHORT TERM GOAL #5   Title she will report pain improved with ROM and normal activity by 30% or more   Status Achieved           PT Long Term Goals - 11/12/16 1613      PT LONG TERM GOAL #1   Title she will be independent with all HEP issued   Time 6   Status On-going     PT LONG TERM GOAL #2   Title She will return to work normal duties    Baseline still favors LT arm   Time 6   Status Partially Met  PT LONG TERM GOAL #3   Title She will improve so able to dress without asssit.    Baseline still trouble reaching behind back,  intermittantly can hook bra    Time 6   Status Partially Met     PT LONG TERM GOAL #4   Title She will have 1-2/10 max pain with all activity   Status Achieved     PT LONG TERM GOAL #5   Title She will be able to ligft 30 pounds from floor and to shoulder height with 2 arms to demo improved functinal strength   Baseline 28 pounds   Time 6   Status On-going     Additional Long Term Goals   Additional Long Term Goals Yes     PT LONG TERM GOAL #6   Title She will be able to get off floor with LT arm support with 1-2 max pain   Time 6   Period Weeks   Status New               Plan - 11/18/16 9767    Clinical Impression Statement She tolerated ROM with pain en range ER and HOR abduction.     PT Treatment/Interventions Cryotherapy;Electrical Stimulation;Iontophoresis 25m/ml Dexamethasone;Moist Heat;Ultrasound;Patient/family education;Manual techniques;Therapeutic exercise;Dry needling;Taping   PT Next Visit Plan Manual and stretching , body weight loaded strengthening   PT Home Exercise Plan [place]  yellow/red, weight bearing stabilization, reaching behind back, hor abduction stretch supine  11/30: weights PRE  now has blue and black band   Consulted and Agree with Plan of Care Patient      Patient will benefit from skilled therapeutic intervention in order to improve the following deficits and impairments:  Decreased range of motion, Pain, Decreased strength, Decreased activity tolerance, Impaired UE functional use  Visit Diagnosis: Displaced fracture of greater tuberosity of left humerus, initial encounter for closed fracture  Stiffness of left shoulder, not elsewhere classified  Left shoulder pain, unspecified chronicity  Cramp and spasm     Problem List Patient Active Problem List   Diagnosis Date Noted  . Routine general medical examination at a health care facility 04/10/2015  . PALPITATIONS 05/30/2009    CDarrel Hoover PT 11/18/2016, 9:29 AM  [facility] [address]Phone: [phone]  Fax:  [phone] Name: Kim BIDDYMRN: [id number] of Birth: 508-Sep-1952

## 2016-11-20 ENCOUNTER — Ambulatory Visit: Payer: PRIVATE HEALTH INSURANCE | Admitting: Physical Therapy

## 2016-11-20 DIAGNOSIS — S42252A Displaced fracture of greater tuberosity of left humerus, initial encounter for closed fracture: Secondary | ICD-10-CM

## 2016-11-20 DIAGNOSIS — M25612 Stiffness of left shoulder, not elsewhere classified: Secondary | ICD-10-CM

## 2016-11-20 DIAGNOSIS — M6281 Muscle weakness (generalized): Secondary | ICD-10-CM

## 2016-11-20 DIAGNOSIS — M25512 Pain in left shoulder: Secondary | ICD-10-CM

## 2016-11-20 DIAGNOSIS — R252 Cramp and spasm: Secondary | ICD-10-CM

## 2016-11-20 NOTE — Therapy (Signed)
[place], [place], [address] Phone: [phone]   Fax:  [phone]  Physical Therapy Treatment  Patient Details  Name: Kim Anderson MRN: [id number] Date of Birth: [birth year] Referring Provider: Esmond Plants, MD  Encounter Date: 11/20/2016      PT End of Session - 11/20/16 0808    Visit Number 27   Number of Visits 32   PT Start Time 0730   PT Stop Time 0800   PT Time Calculation (min) 30 min   Behavior During Therapy [facility] for tasks assessed/performed      Past Medical History:  Diagnosis Date  . History of chest pain   . Mitral valve prolapse    responded to beta-blockers. no evidence of this on 2-d echo  . Palpitations     Past Surgical History:  Procedure Laterality Date  . CHOLECYSTECTOMY  1997  . COLONOSCOPY  11/02/2007   normal  . LEFT SHOULDER ACROMINOPLASTY      There were no vitals filed for this visit.      Subjective Assessment - 11/20/16 0734    Subjective Pain minimal with stretching.  (Hooking BRA)  Able to sleep on left side longer   Pain Score --  mild   Pain Location Shoulder   Pain Orientation Right   Pain Descriptors / Indicators Cramping   Pain Type Acute pain   Pain Frequency Intermittent   Aggravating Factors  reaching on left side,  hooking bra   Pain Relieving Factors change of position,  ice                         [facility] Adult PT Treatment/Exercise - 11/20/16 0001      Shoulder Exercises: ROM/Strengthening   UBE (Upper Arm Bike) L4, 4 minutes 2 Way   Wall Pushups 10 reps   Pushups Limitations from mat   Plank 1 rep  1 each side, and front, 1 + minute each   Other ROM/Strengthening Exercises chair push up 10 X 50% arms, 50 % legs     Shoulder Exercises: Stretch   Corner Stretch 5 reps  ER above head single arm   Corner Stretch Limitations doorway stretch, single arm      Manual Therapy   Joint Mobilization Grade 2 with  distraction for ER   Passive ROM PROM                   PT Short Term Goals - 10/23/16 7106      PT SHORT TERM GOAL #1   Title She will be independent with inital HEP     PT SHORT TERM GOAL #2   Title She will improve active asssited flexion to 120 degrees    Status Achieved     PT SHORT TERM GOAL #3   Title She will improve active  assited abduciton to 110 degrees    Status Achieved     PT SHORT TERM GOAL #4   Title she will improve acti assist ER to 85 degrees   Baseline She can get to 85 degres  long as humerous in scaption   Status Achieved     PT SHORT TERM GOAL #5   Title she will report pain improved with ROM and normal activity by 30% or more   Status Achieved           PT Long Term Goals - 11/20/16 2694  PT LONG TERM GOAL #3   Title She will improve so able to dress without asssit.    Baseline able to dress without assist, extra time to hook bra   Time 6   Period Weeks   Status Partially Met               Plan - 11/20/16 0044    Clinical Impression Statement Shoulder feels better post session.  ER WNL,  Stretches helpful.   PT Next Visit Plan Manual and stretching , body weight loaded strengthening (1 more visit)   PT Home Exercise Plan [place] yellow/red, weight bearing stabilization, reaching behind back, hor abduction stretch supine  11/30: weights PRE  now has blue and black band   Consulted and Agree with Plan of Care Patient      Patient will benefit from skilled therapeutic intervention in order to improve the following deficits and impairments:  Decreased range of motion, Pain, Decreased strength, Decreased activity tolerance, Impaired UE functional use  Visit Diagnosis: Displaced fracture of greater tuberosity of left humerus, initial encounter for closed fracture  Stiffness of left shoulder, not elsewhere classified  Left shoulder pain, unspecified chronicity  Cramp and spasm  Muscle weakness (generalized)  Acute  pain of left shoulder     Problem List Patient Active Problem List   Diagnosis Date Noted  . Routine general medical examination at a health care facility 04/10/2015  . PALPITATIONS 05/30/2009    [name],[name] PTA 11/20/2016, 8:11 AM  [place], [place], [address] Phone: [phone]   Fax:  [phone]  Name: RASHANDA MAGLOIRE MRN: [id number] Date of Birth: May 31, 1951

## 2016-12-03 ENCOUNTER — Ambulatory Visit: Payer: PRIVATE HEALTH INSURANCE | Attending: Internal Medicine

## 2016-12-03 DIAGNOSIS — M25612 Stiffness of left shoulder, not elsewhere classified: Secondary | ICD-10-CM | POA: Diagnosis present

## 2016-12-03 DIAGNOSIS — S42252A Displaced fracture of greater tuberosity of left humerus, initial encounter for closed fracture: Secondary | ICD-10-CM | POA: Insufficient documentation

## 2016-12-03 DIAGNOSIS — M25512 Pain in left shoulder: Secondary | ICD-10-CM | POA: Insufficient documentation

## 2016-12-03 DIAGNOSIS — M6281 Muscle weakness (generalized): Secondary | ICD-10-CM | POA: Diagnosis present

## 2016-12-03 NOTE — Therapy (Signed)
[place], [place], [address] Phone: [phone]   Fax:  [phone]  Physical Therapy Treatment  Patient Details  Name: MARISABEL MACPHERSON MRN: [id number] Date of Birth: Jul 01, 1951 Referring Provider: Esmond Plants, MD  Encounter Date: 12/03/2016      PT End of Session - 12/03/16 0756    Visit Number 28   Number of Visits 32   Date for PT Re-Evaluation 12/20/16   Authorization Type Workers comp   Authorization - Visit Number 26   PT Start Time 0757   PT Stop Time 0835   PT Time Calculation (min) 38 min   Activity Tolerance Patient tolerated treatment well   Behavior During Therapy [facility] for tasks assessed/performed      Past Medical History:  Diagnosis Date  . History of chest pain   . Mitral valve prolapse    responded to beta-blockers. no evidence of this on 2-d echo  . Palpitations     Past Surgical History:  Procedure Laterality Date  . CHOLECYSTECTOMY  1997  . COLONOSCOPY  11/02/2007   normal  . LEFT SHOULDER ACROMINOPLASTY      There were no vitals filed for this visit.      Subjective Assessment - 12/03/16 0754    Subjective No pain   Currently in Pain? No/denies                         [facility] Adult PT Treatment/Exercise - 12/03/16 0758      Shoulder Exercises: Sidelying   Other Sidelying Exercises LT side lying  IR stretch 3 x 30 sec added for home   Other Sidelying Exercises LT side plank on elbow x 60 sec 1 reps and 45 sec x 1 rep with knees flexed     Shoulder Exercises: Standing   Protraction Weight (lbs)       Shoulder Exercises: ROM/Strengthening   UBE (Upper Arm Bike) L4, 4 minutes each forward and back     Other ROM/Strengthening Exercises chair push up legs extended 2x10     Shoulder Exercises: Stretch   Corner Stretch Limitations doorway stretch, single arm LT 3x30 sec     Manual Therapy   Passive ROM PROM    Other Manual Therapy mobs with  movement with posterior glide 3x15 reps                PT Education - 12/03/16 0841    Education Details LT sidelye IR She declined handout   Person(s) Educated Patient   Methods Explanation;Tactile cues;Verbal cues   Comprehension Returned demonstration;Verbalized understanding          PT Short Term Goals - 10/23/16 0843      PT SHORT TERM GOAL #1   Title She will be independent with inital HEP     PT SHORT TERM GOAL #2   Title She will improve active asssited flexion to 120 degrees    Status Achieved     PT SHORT TERM GOAL #3   Title She will improve active  assited abduciton to 110 degrees    Status Achieved     PT SHORT TERM GOAL #4   Title she will improve acti assist ER to 85 degrees   Baseline She can get to 85 degres  long as humerous in scaption   Status Achieved     PT SHORT TERM GOAL #5   Title she will report pain improved with ROM  and normal activity by 30% or more   Status Achieved           PT Long Term Goals - 11/20/16 0809      PT LONG TERM GOAL #3   Title She will improve so able to dress without asssit.    Baseline able to dress without assist, extra time to hook bra   Time 6   Period Weeks   Status Partially Met               Plan - 12/03/16 0755    Clinical Impression Statement She Can do what she needs to do still withpain  with end range ER and reaching behind back  but a thend of session active reach behind back equal LT to RT   Continue x 1 then discharge   PT Treatment/Interventions Cryotherapy;Electrical Stimulation;Iontophoresis 57m/ml Dexamethasone;Moist Heat;Ultrasound;Patient/family education;Manual techniques;Therapeutic exercise;Dry needling;Taping   PT Next Visit Plan Manual and stretching , body weight loaded strengthening (1 more visit)  measure AROM   PT Home Exercise Plan [place] yellow/red, weight bearing stabilization, reaching behind back, hor abduction stretch supine  11/30: weights PRE  now has blue  and black band, IR stretch LT sidelye   Consulted and Agree with Plan of Care Patient      Patient will benefit from skilled therapeutic intervention in order to improve the following deficits and impairments:  Decreased range of motion, Pain, Decreased strength, Decreased activity tolerance, Impaired UE functional use  Visit Diagnosis: Displaced fracture of greater tuberosity of left humerus, initial encounter for closed fracture  Stiffness of left shoulder, not elsewhere classified  Left shoulder pain, unspecified chronicity  Muscle weakness (generalized)     Problem List Patient Active Problem List   Diagnosis Date Noted  . Routine general medical examination at a health care facility 04/10/2015  . PALPITATIONS 05/30/2009    CDarrel Hoover PT 12/03/2016, 8:44 AM  [facility] [address]Phone: [phone]  Fax:  [phone] Name: LELYANAH FARINOMRN: [id number] of Birth: 520-Feb-1952

## 2016-12-03 NOTE — Patient Instructions (Signed)
Stretch LT side lying for IR with body rotation to LT . 30 sec x 3 1-2 x/day

## 2016-12-04 ENCOUNTER — Ambulatory Visit
Admission: RE | Admit: 2016-12-04 | Discharge: 2016-12-04 | Disposition: A | Discharge status: Home / Self Care | Payer: 59 | Source: Ambulatory Visit | Attending: Internal Medicine | Admitting: Internal Medicine

## 2016-12-04 DIAGNOSIS — Z1231 Encounter for screening mammogram for malignant neoplasm of breast: Secondary | ICD-10-CM

## 2016-12-05 ENCOUNTER — Ambulatory Visit: Payer: PRIVATE HEALTH INSURANCE

## 2016-12-05 DIAGNOSIS — M6281 Muscle weakness (generalized): Secondary | ICD-10-CM

## 2016-12-05 DIAGNOSIS — M25612 Stiffness of left shoulder, not elsewhere classified: Secondary | ICD-10-CM

## 2016-12-05 DIAGNOSIS — M25512 Pain in left shoulder: Secondary | ICD-10-CM

## 2016-12-05 DIAGNOSIS — S42252A Displaced fracture of greater tuberosity of left humerus, initial encounter for closed fracture: Secondary | ICD-10-CM | POA: Diagnosis not present

## 2016-12-05 NOTE — Therapy (Signed)
[facility] [address] [place], [place], [address] Phone: [phone]   Fax:  [phone]  Physical Therapy Treatment/ Discharge  Patient Details  Name: Kim Anderson MRN: [id number] Date of Birth: 1951/04/05 Referring Provider: Malon Kindle, MD  Encounter Date: 12/05/2016      PT End of Session - 12/05/16 1235    Visit Number 29   Number of Visits 32   Date for PT Re-Evaluation 12/20/16   Authorization Type Workers comp   PT Start Time 1225   PT Stop Time 1320   PT Time Calculation (min) 55 min   Activity Tolerance Patient tolerated treatment well   Behavior During Therapy WFL for tasks assessed/performed      Past Medical History:  Diagnosis Date  . History of chest pain   . Mitral valve prolapse    responded to beta-blockers. no evidence of this on 2-d echo  . Palpitations     Past Surgical History:  Procedure Laterality Date  . CHOLECYSTECTOMY  1997  . COLONOSCOPY  11/02/2007   normal  . LEFT SHOULDER ACROMINOPLASTY      There were no vitals filed for this visit.          [facility] PT Assessment - 12/05/16 0001      AROM   Left Shoulder Flexion 134 Degrees   Left Shoulder ABduction 143 Degrees   Left Shoulder Internal Rotation 65 Degrees  She is 2 inches less on LT but this is before stretching   Left Shoulder External Rotation 85 Degrees     PROM   Left Shoulder Flexion 140 Degrees   Left Shoulder ABduction 150 Degrees   Left Shoulder Internal Rotation 65 Degrees   Left Shoulder External Rotation 90 Degrees     Strength   Overall Strength Within functional limits for tasks performed                     [facility] Adult PT Treatment/Exercise - 12/05/16 1321      Shoulder Exercises: ROM/Strengthening   UBE (Upper Arm Bike) L4, 4 minutes each forward and back       Manual Therapy   Passive ROM ALl planes  with sustained end range stretch  with pain as limiting factor   Flexion most  painful   Other Manual Therapy mobs with movement with posterior glide 3x15 reps and inferior glides Gr 4  an distraction                PT Education - 12/05/16 1321    Education provided Yes   Education Details Need to continue ROM to max flexibility and make functional activity without pain or excess effort   Person(s) Educated Patient   Methods Explanation   Comprehension Verbalized understanding          PT Short Term Goals - 10/23/16 0843      PT SHORT TERM GOAL #1   Title She will be independent with inital HEP     PT SHORT TERM GOAL #2   Title She will improve active asssited flexion to 120 degrees    Status Achieved     PT SHORT TERM GOAL #3   Title She will improve active  assited abduciton to 110 degrees    Status Achieved     PT SHORT TERM GOAL #4   Title she will improve acti assist ER to 85 degrees   Baseline She can get to 85 degres  long as  humerous in scaption   Status Achieved     PT SHORT TERM GOAL #5   Title she will report pain improved with ROM and normal activity by 30% or more   Status Achieved           PT Long Term Goals - 12/05/16 1326      PT LONG TERM GOAL #1   Title she will be independent with all HEP issued   Status Achieved     PT LONG TERM GOAL #2   Title She will return to work normal duties    Status Achieved     PT [place] #3   Title She will improve so able to dress without asssit.    Status Achieved     PT LONG TERM GOAL #4   Title She will have 1-2/10 max pain with all activity   Status Achieved     PT LONG TERM GOAL #5   Title She will be able to ligft 30 pounds from floor and to shoulder height with 2 arms to demo improved functinal strength   Baseline 28 pounds   Status Partially Met     PT LONG TERM GOAL #6   Title She will be able to get off floor with LT arm support with 1-2 max pain   Status Achieved               Plan - 12/05/16 1235    Clinical Impression Statement Ms Kim Anderson  has progressed well with strength that is WNL and pain only at end range.    ROM is [facility] but she still feels tightness end ROM and pain if pushed at end range mostly with flexion She feels she can handle ferself from now on with HEP   PT Treatment/Interventions Cryotherapy;Electrical Stimulation;Iontophoresis 54m/ml Dexamethasone;Moist Heat;Ultrasound;Patient/family education;Manual techniques;Therapeutic exercise;Dry needling;Taping   PT Next Visit Plan Discharged with HEP   PT Home Exercise Plan [place] yellow/red, weight bearing stabilization, reaching behind back, hor abduction stretch supine  11/30: weights PRE  now has blue and black band, IR stretch LT sidelye   Consulted and Agree with Plan of Care Patient      Patient will benefit from skilled therapeutic intervention in order to improve the following deficits and impairments:  Decreased range of motion, Pain, Decreased strength, Decreased activity tolerance, Impaired UE functional use  Visit Diagnosis: Displaced fracture of greater tuberosity of left humerus, initial encounter for closed fracture  Stiffness of left shoulder, not elsewhere classified  Left shoulder pain, unspecified chronicity  Muscle weakness (generalized)     Problem List Patient Active Problem List   Diagnosis Date Noted  . Routine general medical examination at a health care facility 04/10/2015  . PALPITATIONS 05/30/2009    CDarrel Anderson PT 12/05/2016, 1:26 PM  [facility] [address]Phone: [phone]  Fax:  [phone] Name: Kim CURETMRN: [id number] of Birth: 5January 11, 1952  PHYSICAL THERAPY DISCHARGE SUMMARY  Visits from Start of Care: 29  Current functional level related to goals / functional outcomes: See above   Remaining deficits: See above. Reaching to hook bra is still with more effort than normal and reaching overhead is painful at end  range.  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  Patient goals were met. Patient is being discharged due to meeting the stated  rehab goals.  ?????

## 2016-12-17 DIAGNOSIS — H52223 Regular astigmatism, bilateral: Secondary | ICD-10-CM | POA: Diagnosis not present

## 2016-12-17 DIAGNOSIS — H5213 Myopia, bilateral: Secondary | ICD-10-CM | POA: Diagnosis not present

## 2016-12-17 DIAGNOSIS — H524 Presbyopia: Secondary | ICD-10-CM | POA: Diagnosis not present

## 2017-05-01 ENCOUNTER — Other Ambulatory Visit (INDEPENDENT_AMBULATORY_CARE_PROVIDER_SITE_OTHER): Payer: 59

## 2017-05-01 ENCOUNTER — Ambulatory Visit (INDEPENDENT_AMBULATORY_CARE_PROVIDER_SITE_OTHER): Payer: 59 | Admitting: Internal Medicine

## 2017-05-01 ENCOUNTER — Encounter: Payer: Self-pay | Admitting: Internal Medicine

## 2017-05-01 VITALS — BP 130/80 | HR 64 | Temp 98.7°F | Resp 12 | Ht 66.0 in | Wt 185.0 lb

## 2017-05-01 DIAGNOSIS — R002 Palpitations: Secondary | ICD-10-CM

## 2017-05-01 DIAGNOSIS — Z0000 Encounter for general adult medical examination without abnormal findings: Secondary | ICD-10-CM

## 2017-05-01 LAB — COMPREHENSIVE METABOLIC PANEL
ALBUMIN: 4.6 g/dL (ref 3.5–5.2)
ALT: 16 U/L (ref 0–35)
AST: 19 U/L (ref 0–37)
Alkaline Phosphatase: 60 U/L (ref 39–117)
BILIRUBIN TOTAL: 1.1 mg/dL (ref 0.2–1.2)
BUN: 12 mg/dL (ref 6–23)
CALCIUM: 9.5 mg/dL (ref 8.4–10.5)
CHLORIDE: 104 meq/L (ref 96–112)
CO2: 27 meq/L (ref 19–32)
Creatinine, Ser: 0.97 mg/dL (ref 0.40–1.20)
GFR: 61.44 mL/min (ref >60.00)
Glucose, Bld: 114 mg/dL — ABNORMAL HIGH (ref 70–99)
Potassium: 4.7 mEq/L (ref 3.5–5.1)
Sodium: 137 mEq/L (ref 135–145)
Total Protein: 7.4 g/dL (ref 6.0–8.3)

## 2017-05-01 LAB — CBC
HEMATOCRIT: 44.9 % (ref 36.0–46.0)
Hemoglobin: 15.6 g/dL — ABNORMAL HIGH (ref 12.0–15.0)
MCHC: 34.7 g/dL (ref 30.0–36.0)
MCV: 90.3 fl (ref 78.0–100.0)
PLATELETS: 236 10*3/uL (ref 150.0–400.0)
RBC: 4.97 Mil/uL (ref 3.87–5.11)
RDW: 12.8 % (ref 11.5–15.5)
WBC: 5.5 10*3/uL (ref 4.0–10.5)

## 2017-05-01 LAB — LIPID PANEL
CHOL/HDL RATIO: 3
Cholesterol: 214 mg/dL — ABNORMAL HIGH (ref 0–200)
HDL: 83.3 mg/dL (ref >39.00)
LDL CALC: 119 mg/dL — AB (ref 0–99)
NonHDL: 131.11
TRIGLYCERIDES: 62 mg/dL (ref 0.0–149.0)
VLDL: 12.4 mg/dL (ref 0.0–40.0)

## 2017-05-01 LAB — HEMOGLOBIN A1C: Hgb A1c MFr Bld: 5.7 % (ref 4.6–6.5)

## 2017-05-01 NOTE — Patient Instructions (Signed)
Think about getting the shingrix which is the new shingles shot. This is a 2 shot series taken 2 months apart to give you better protection from shingles.   We will have the cologuard sent to the house to screen for colon polyps.   Health Maintenance, Female Adopting a healthy lifestyle and getting preventive care can go a long way to promote health and wellness. Talk with your health care provider about what schedule of regular examinations is right for you. This is a good chance for you to check in with your provider about disease prevention and staying healthy. In between checkups, there are plenty of things you can do on your own. Experts have done a lot of research about which lifestyle changes and preventive measures are most likely to keep you healthy. Ask your health care provider for more information. Weight and diet Eat a healthy diet  Be sure to include plenty of vegetables, fruits, low-fat dairy products, and lean protein.  Do not eat a lot of foods high in solid fats, added sugars, or salt.  Get regular exercise. This is one of the most important things you can do for your health. ? Most adults should exercise for at least 150 minutes each week. The exercise should increase your heart rate and make you sweat (moderate-intensity exercise). ? Most adults should also do strengthening exercises at least twice a week. This is in addition to the moderate-intensity exercise.  Maintain a healthy weight  Body mass index (BMI) is a measurement that can be used to identify possible weight problems. It estimates body fat based on height and weight. Your health care provider can help determine your BMI and help you achieve or maintain a healthy weight.  For females 1 years of age and older: ? A BMI below 18.5 is considered underweight. ? A BMI of 18.5 to 24.9 is normal. ? A BMI of 25 to 29.9 is considered overweight. ? A BMI of 30 and above is considered obese.  Watch levels of  cholesterol and blood lipids  You should start having your blood tested for lipids and cholesterol at 64 years of age, then have this test every 5 years.  You may need to have your cholesterol levels checked more often if: ? Your lipid or cholesterol levels are high. ? You are older than 64 years of age. ? You are at high risk for heart disease.  Cancer screening Lung Cancer  Lung cancer screening is recommended for adults 25-27 years old who are at high risk for lung cancer because of a history of smoking.  A yearly low-dose CT scan of the lungs is recommended for people who: ? Currently smoke. ? Have quit within the past 15 years. ? Have at least a 30-pack-year history of smoking. A pack year is smoking an average of one pack of cigarettes a day for 1 year.  Yearly screening should continue until it has been 15 years since you quit.  Yearly screening should stop if you develop a health problem that would prevent you from having lung cancer treatment.  Breast Cancer  Practice breast self-awareness. This means understanding how your breasts normally appear and feel.  It also means doing regular breast self-exams. Let your health care provider know about any changes, no matter how small.  If you are in your 20s or 30s, you should have a clinical breast exam (CBE) by a health care provider every 1-3 years as part of a regular health exam.  If you are 40 or older, have a CBE every year. Also consider having a breast X-ray (mammogram) every year.  If you have a family history of breast cancer, talk to your health care provider about genetic screening.  If you are at high risk for breast cancer, talk to your health care provider about having an MRI and a mammogram every year.  Breast cancer gene (BRCA) assessment is recommended for women who have family members with BRCA-related cancers. BRCA-related cancers include: ? Breast. ? Ovarian. ? Tubal. ? Peritoneal cancers.  Results  of the assessment will determine the need for genetic counseling and BRCA1 and BRCA2 testing.  Cervical Cancer Your health care provider may recommend that you be screened regularly for cancer of the pelvic organs (ovaries, uterus, and vagina). This screening involves a pelvic examination, including checking for microscopic changes to the surface of your cervix (Pap test). You may be encouraged to have this screening done every 3 years, beginning at age 21.  For women ages 30-65, health care providers may recommend pelvic exams and Pap testing every 3 years, or they may recommend the Pap and pelvic exam, combined with testing for human papilloma virus (HPV), every 5 years. Some types of HPV increase your risk of cervical cancer. Testing for HPV may also be done on women of any age with unclear Pap test results.  Other health care providers may not recommend any screening for nonpregnant women who are considered low risk for pelvic cancer and who do not have symptoms. Ask your health care provider if a screening pelvic exam is right for you.  If you have had past treatment for cervical cancer or a condition that could lead to cancer, you need Pap tests and screening for cancer for at least 20 years after your treatment. If Pap tests have been discontinued, your risk factors (such as having a new sexual partner) need to be reassessed to determine if screening should resume. Some women have medical problems that increase the chance of getting cervical cancer. In these cases, your health care provider may recommend more frequent screening and Pap tests.  Colorectal Cancer  This type of cancer can be detected and often prevented.  Routine colorectal cancer screening usually begins at 64 years of age and continues through 64 years of age.  Your health care provider may recommend screening at an earlier age if you have risk factors for colon cancer.  Your health care provider may also recommend using  home test kits to check for hidden blood in the stool.  A small camera at the end of a tube can be used to examine your colon directly (sigmoidoscopy or colonoscopy). This is done to check for the earliest forms of colorectal cancer.  Routine screening usually begins at age 50.  Direct examination of the colon should be repeated every 5-10 years through 64 years of age. However, you may need to be screened more often if early forms of precancerous polyps or small growths are found.  Skin Cancer  Check your skin from head to toe regularly.  Tell your health care provider about any new moles or changes in moles, especially if there is a change in a mole's shape or color.  Also tell your health care provider if you have a mole that is larger than the size of a pencil eraser.  Always use sunscreen. Apply sunscreen liberally and repeatedly throughout the day.  Protect yourself by wearing long sleeves, pants, a wide-brimmed hat, and   sunglasses whenever you are outside.  Heart disease, diabetes, and high blood pressure  High blood pressure causes heart disease and increases the risk of stroke. High blood pressure is more likely to develop in: ? People who have blood pressure in the high end of the normal range (130-139/85-89 mm Hg). ? People who are overweight or obese. ? People who are African American.  If you are 44-32 years of age, have your blood pressure checked every 3-5 years. If you are 62 years of age or older, have your blood pressure checked every year. You should have your blood pressure measured twice-once when you are at a hospital or clinic, and once when you are not at a hospital or clinic. Record the average of the two measurements. To check your blood pressure when you are not at a hospital or clinic, you can use: ? An automated blood pressure machine at a pharmacy. ? A home blood pressure monitor.  If you are between 71 years and 6 years old, ask your health care provider  if you should take aspirin to prevent strokes.  Have regular diabetes screenings. This involves taking a blood sample to check your fasting blood sugar level. ? If you are at a normal weight and have a low risk for diabetes, have this test once every three years after 64 years of age. ? If you are overweight and have a high risk for diabetes, consider being tested at a younger age or more often. Preventing infection Hepatitis B  If you have a higher risk for hepatitis B, you should be screened for this virus. You are considered at high risk for hepatitis B if: ? You were born in a country where hepatitis B is common. Ask your health care provider which countries are considered high risk. ? Your parents were born in a high-risk country, and you have not been immunized against hepatitis B (hepatitis B vaccine). ? You have HIV or AIDS. ? You use needles to inject street drugs. ? You live with someone who has hepatitis B. ? You have had sex with someone who has hepatitis B. ? You get hemodialysis treatment. ? You take certain medicines for conditions, including cancer, organ transplantation, and autoimmune conditions.  Hepatitis C  Blood testing is recommended for: ? Everyone born from [birth year] through 1963. ? Anyone with known risk factors for hepatitis C.  Sexually transmitted infections (STIs)  You should be screened for sexually transmitted infections (STIs) including gonorrhea and chlamydia if: ? You are sexually active and are younger than 64 years of age. ? You are older than 64 years of age and your health care provider tells you that you are at risk for this type of infection. ? Your sexual activity has changed since you were last screened and you are at an increased risk for chlamydia or gonorrhea. Ask your health care provider if you are at risk.  If you do not have HIV, but are at risk, it may be recommended that you take a prescription medicine daily to prevent HIV infection. This  is called pre-exposure prophylaxis (PrEP). You are considered at risk if: ? You are sexually active and do not regularly use condoms or know the HIV status of your partner(s). ? You take drugs by injection. ? You are sexually active with a partner who has HIV.  Talk with your health care provider about whether you are at high risk of being infected with HIV. If you choose to begin PrEP, you  should first be tested for HIV. You should then be tested every 3 months for as long as you are taking PrEP. Pregnancy  If you are premenopausal and you may become pregnant, ask your health care provider about preconception counseling.  If you may become pregnant, take 400 to 800 micrograms (mcg) of folic acid every day.  If you want to prevent pregnancy, talk to your health care provider about birth control (contraception). Osteoporosis and menopause  Osteoporosis is a disease in which the bones lose minerals and strength with aging. This can result in serious bone fractures. Your risk for osteoporosis can be identified using a bone density scan.  If you are 73 years of age or older, or if you are at risk for osteoporosis and fractures, ask your health care provider if you should be screened.  Ask your health care provider whether you should take a calcium or vitamin D supplement to lower your risk for osteoporosis.  Menopause may have certain physical symptoms and risks.  Hormone replacement therapy may reduce some of these symptoms and risks. Talk to your health care provider about whether hormone replacement therapy is right for you. Follow these instructions at home:  Schedule regular health, dental, and eye exams.  Stay current with your immunizations.  Do not use any tobacco products including cigarettes, chewing tobacco, or electronic cigarettes.  If you are pregnant, do not drink alcohol.  If you are breastfeeding, limit how much and how often you drink alcohol.  Limit alcohol intake  to no more than 1 drink per day for nonpregnant women. One drink equals 12 ounces of beer, 5 ounces of wine, or 1 ounces of hard liquor.  Do not use street drugs.  Do not share needles.  Ask your health care provider for help if you need support or information about quitting drugs.  Tell your health care provider if you often feel depressed.  Tell your health care provider if you have ever been abused or do not feel safe at home. This information is not intended to replace advice given to you by your health care provider. Make sure you discuss any questions you have with your health care provider. Document Released: 06/03/2011 Document Revised: 04/25/2016 Document Reviewed: 08/22/2015 Elsevier Interactive Patient Education  [employer].

## 2017-05-01 NOTE — Assessment & Plan Note (Signed)
Stable on metoprolol 25 mg daily. Last EKG normal and no indication for repeat today. Checking CMP and adjust as needed.

## 2017-05-01 NOTE — Progress Notes (Signed)
   Subjective:    Patient ID: Marjie SkiffLaura S Broady, female    DOB: 12/09/1950, 66 y.o.   MRN: [id number]  HPI The patient is a 66 YO female coming in for wellness. No new concerns.   [facility], [facility], social history reviewed and updated.   Review of Systems  Constitutional: Negative.   HENT: Negative.   Eyes: Negative.   Respiratory: Negative for cough, chest tightness and shortness of breath.   Cardiovascular: Negative for chest pain, palpitations and leg swelling.  Gastrointestinal: Negative for abdominal distention, abdominal pain, constipation, diarrhea, nausea and vomiting.  Musculoskeletal: Negative.   Skin: Negative.   Neurological: Negative.   Psychiatric/Behavioral: Negative.       Objective:   Physical Exam  Constitutional: She is oriented to person, place, and time. She appears well-developed and well-nourished.  HENT:  Head: Normocephalic and atraumatic.  Eyes: EOM are normal.  Neck: Normal range of motion.  Cardiovascular: Normal rate and regular rhythm.   Pulmonary/Chest: Effort normal and breath sounds normal. No respiratory distress. She has no wheezes. She has no rales.  Abdominal: Soft. Bowel sounds are normal. She exhibits no distension. There is no tenderness. There is no rebound.  Musculoskeletal: She exhibits no edema.  Neurological: She is alert and oriented to person, place, and time. Coordination normal.  Skin: Skin is warm and dry.  Psychiatric: She has a normal mood and affect.   Vitals:   05/01/17 0753  BP: 130/80  Pulse: 64  Resp: 12  Temp: 98.7 F (37.1 C)  TempSrc: Oral  SpO2: 98%  Weight: 185 lb (83.9 kg)  Height: 5\' 6"  (1.676 m)      Assessment & Plan:

## 2017-05-01 NOTE — Assessment & Plan Note (Signed)
Ordered cologuard for colon cancer screening. Counseled about shingrix and she will think about getting. Tetanus up to date and counseled about yearly flu shot. Declines HIV screening, hep c screening done. Mammogram up to date and normal. Pap smear up to date and will age out by next screening. Exercising and non-smoker. Counseled about sun safety and mole surveillance. Given screening recommendations.

## 2017-06-03 ENCOUNTER — Other Ambulatory Visit: Payer: Self-pay | Admitting: Internal Medicine

## 2017-06-03 MED FILL — ASPIRIN EC 81 MG TABLET: 81 | 30 days supply | Qty: 30 | Fill #0

## 2017-06-03 MED FILL — METOPROLOL SUCC ER 25 MG TA: 25 | 90 days supply | Qty: 90 | Fill #0

## 2017-06-03 MED FILL — FISH OIL 1,000 MG CAPSULE: 1000 | 100 days supply | Qty: 100 | Fill #1

## 2017-07-11 MED FILL — ASPIRIN ADULT LOW STRENGTH: 81 | 90 days supply | Qty: 90 | Fill #1

## 2017-09-02 ENCOUNTER — Encounter: Payer: Self-pay | Admitting: Internal Medicine

## 2017-10-06 MED FILL — FISH OIL 1,000 MG CAPSULE: 1000 | 100 days supply | Qty: 100 | Fill #2

## 2017-10-06 MED FILL — CALCIUM 600 + VIT D 400 TAB: 600-400 | 150 days supply | Qty: 150 | Fill #1

## 2017-10-06 MED FILL — ASPIRIN ADULT LOW STRENGTH: 81 | 90 days supply | Qty: 90 | Fill #2

## 2017-10-06 MED FILL — B COMPLEX TABLET: 100 days supply | Qty: 100 | Fill #1

## 2017-10-06 MED FILL — METOPROLOL SUCC ER 25 MG TA: 25 | 90 days supply | Qty: 90 | Fill #1

## 2017-11-22 IMAGING — DX DG SHOULDER 1V*L*
2 series · 2 of 2 positions shown · non-contrast
Comparison: None.

CLINICAL DATA: Left shoulder pain/injury, fell on left shoulder

EXAM:
LEFT SHOULDER - 1 VIEW

[shoulder ap neutral (1 of 2)]
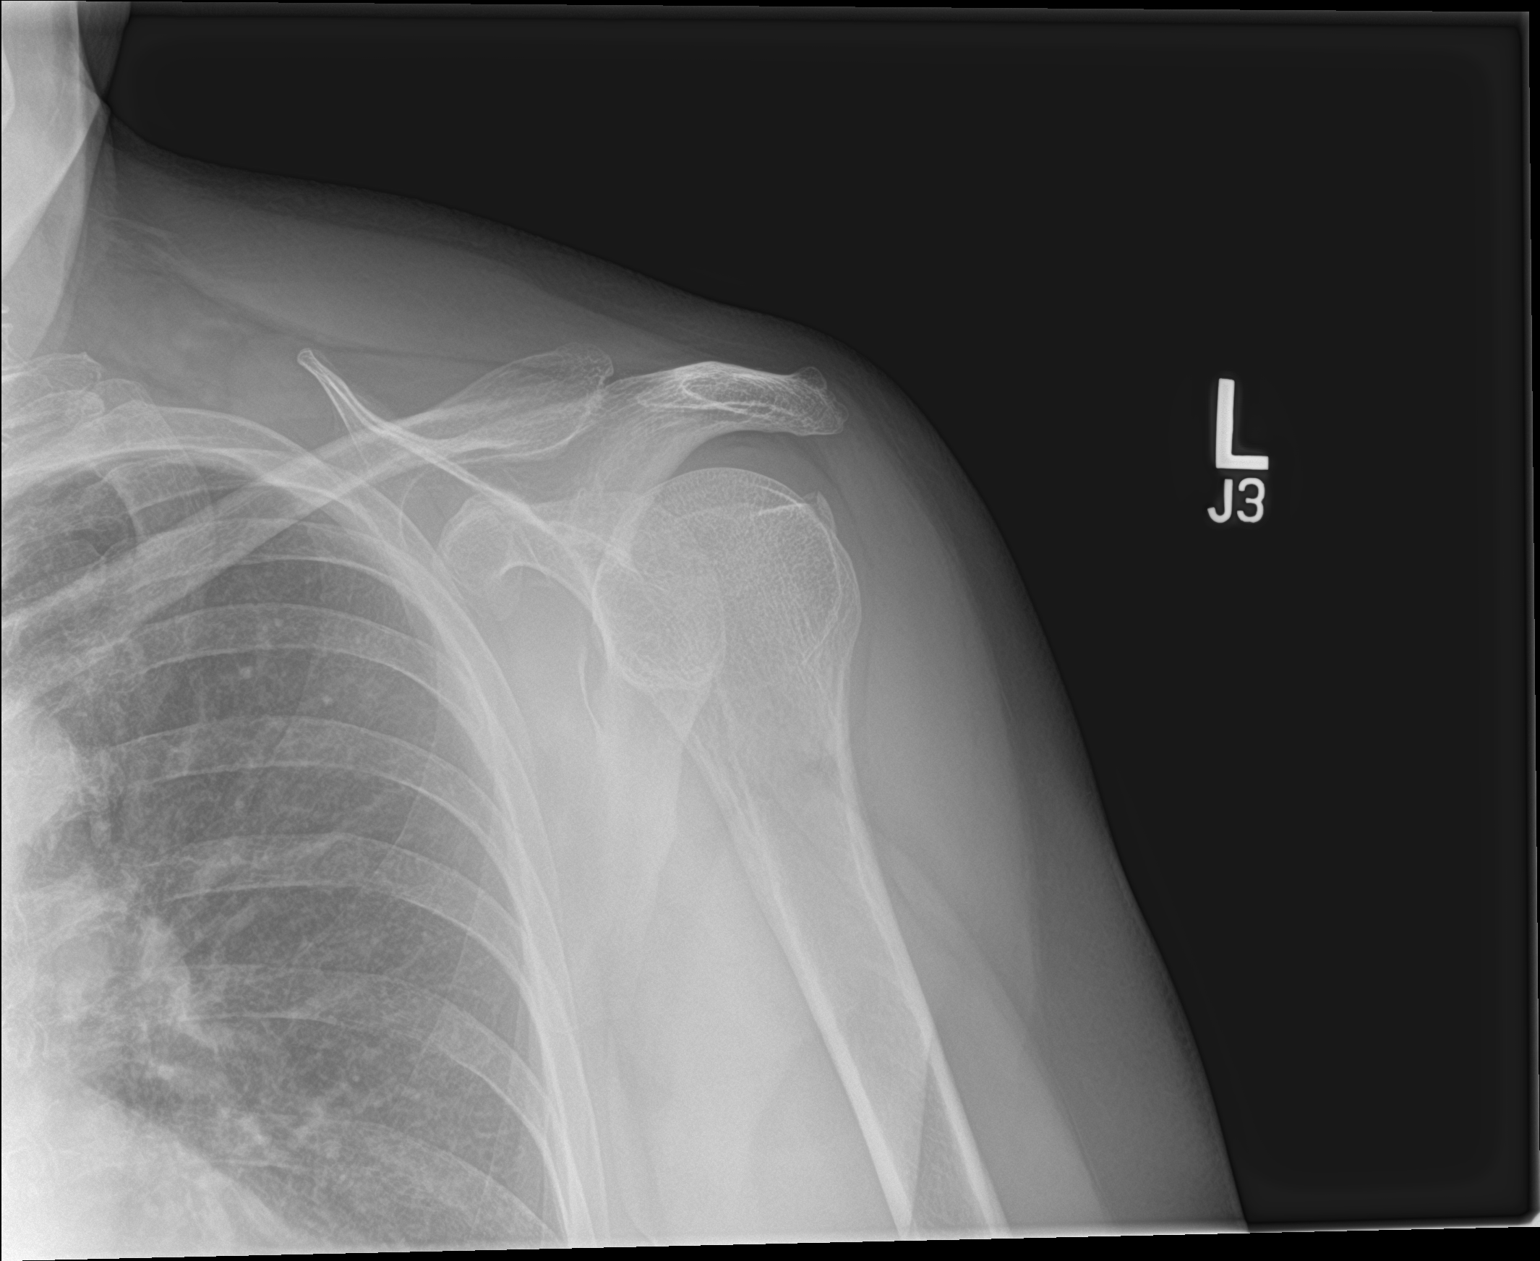

[shoulder ap neutral (2 of 2)]
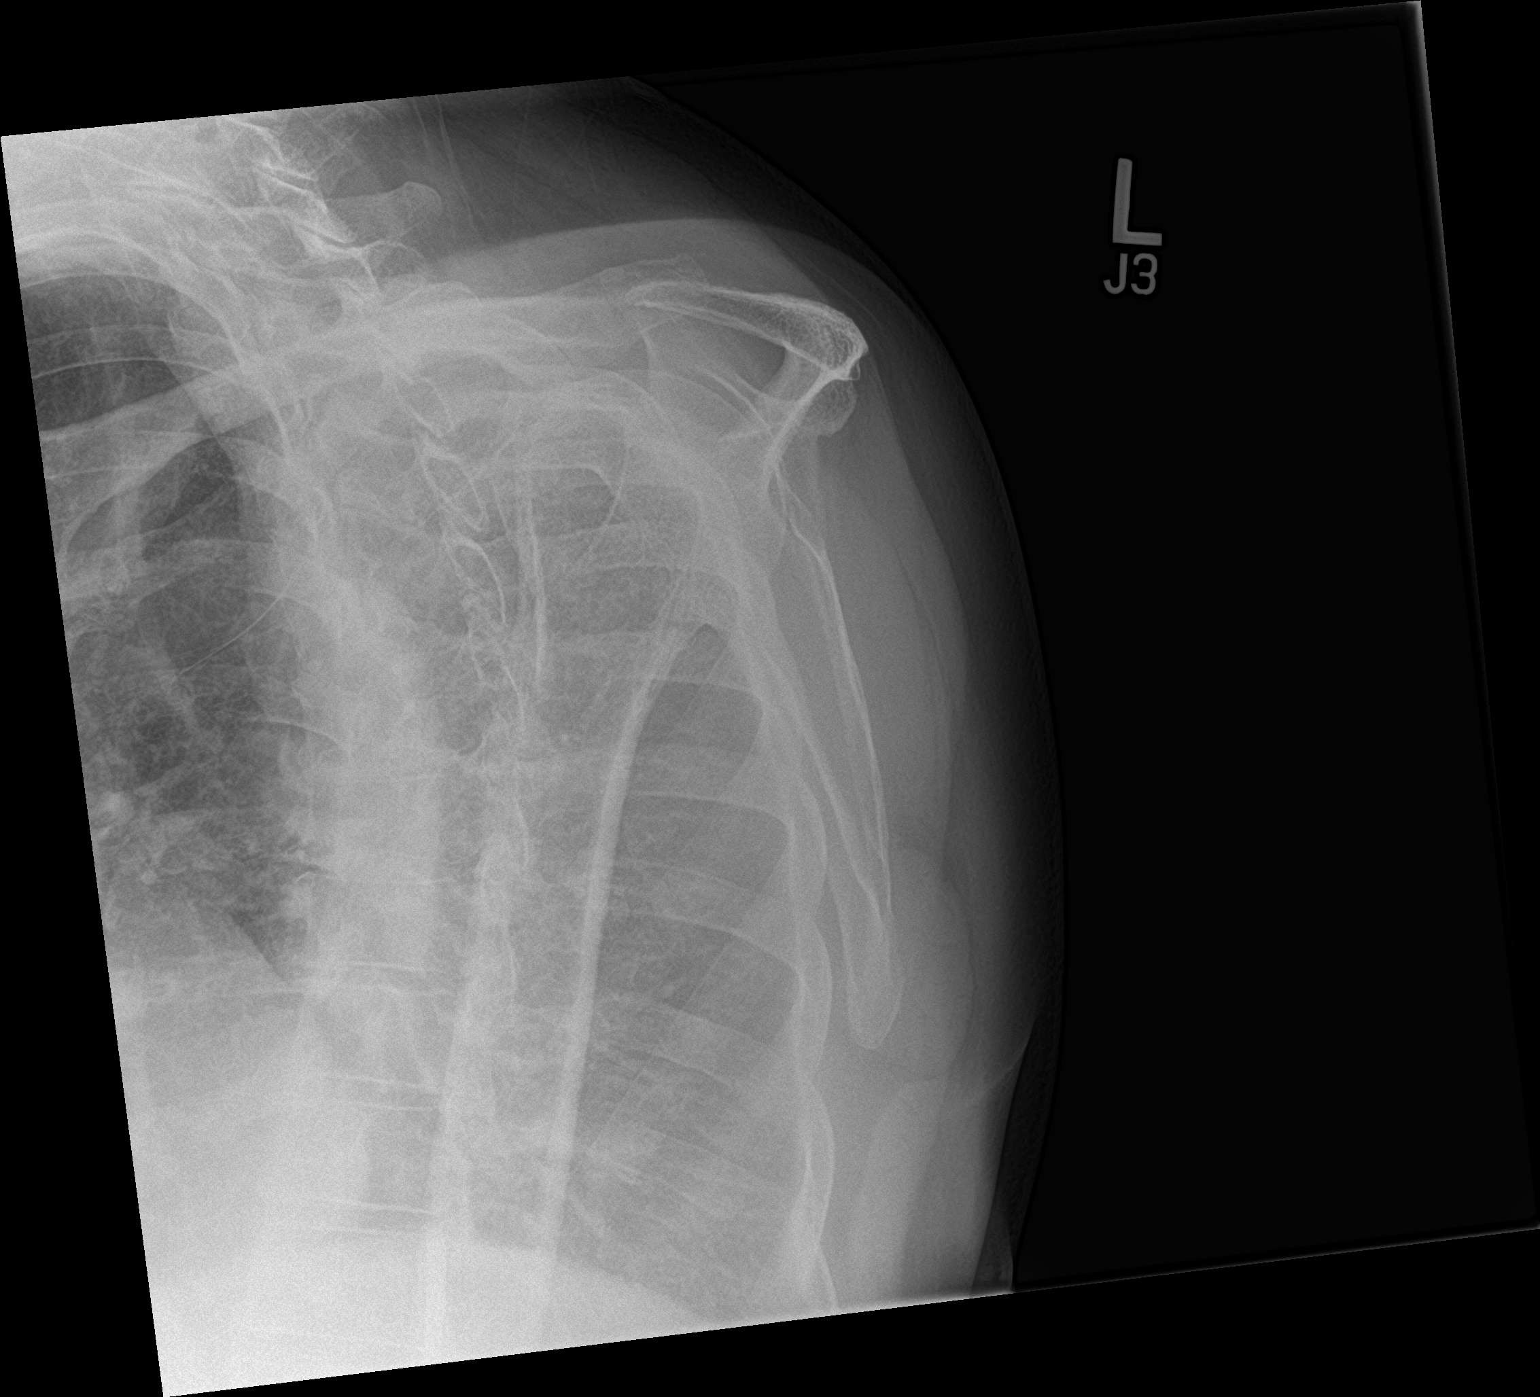

[2 of 2 positions shown; findings below may reference images not displayed]

FINDINGS: Two views of the left shoulder submitted. There is cortical
irregularity in the region of greater tuberosity highly suspicious
for nondisplaced fracture. Clinical correlation is necessary.
IMPRESSION: There is cortical irregularity in region of greater tuberosity
highly suspicious for fracture.

## 2017-12-19 DIAGNOSIS — H524 Presbyopia: Secondary | ICD-10-CM | POA: Diagnosis not present

## 2017-12-19 DIAGNOSIS — H52223 Regular astigmatism, bilateral: Secondary | ICD-10-CM | POA: Diagnosis not present

## 2017-12-19 DIAGNOSIS — H5213 Myopia, bilateral: Secondary | ICD-10-CM | POA: Diagnosis not present

## 2017-12-20 IMAGING — MR MR SHOULDER*L* W/O CM
4 of 5 series · 19 of 40 positions shown · non-contrast
Comparison: 06/14/2016

CLINICAL DATA: Left shoulder pain limited mobility. Fall June 14, 2016. Greater tuberosity fracture.

EXAM:
MRI OF THE LEFT SHOULDER WITHOUT CONTRAST
TECHNIQUE: Multiplanar, multisequence MR imaging of the shoulder was performed.
No intravenous contrast was administered.

[Series 4: T2 fat-sat · axial · 4.0mm · 0.23mm/px · z∈[-47,+33]mm · 6 of 20 slices shown (1 of 3)]
[im 1/20]
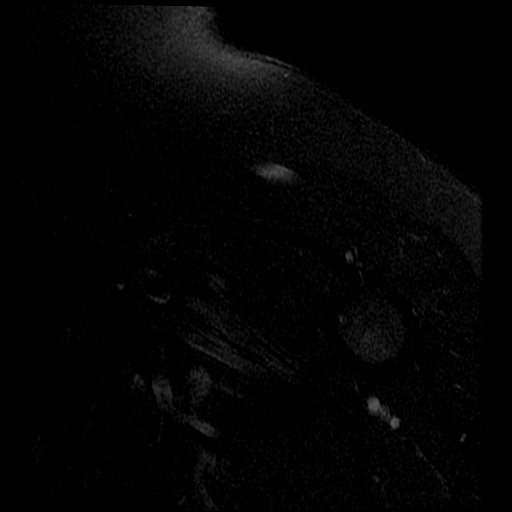
[im 3/20]
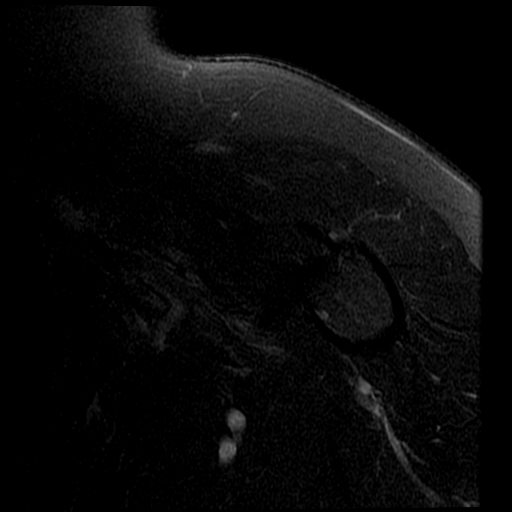
[im 6/20]
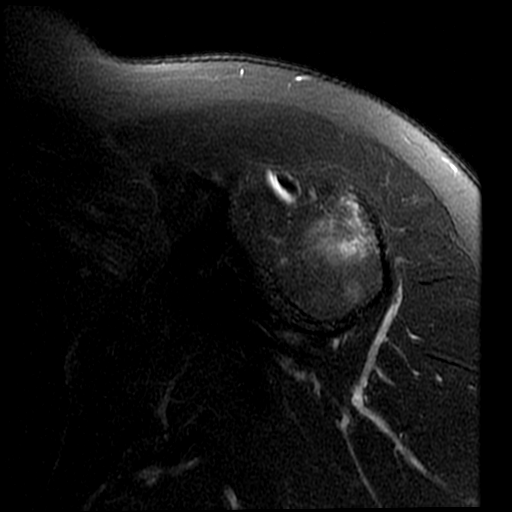
[im 9/20]
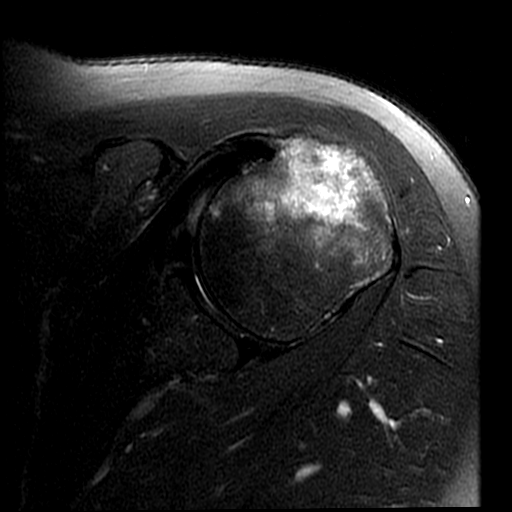
[im 11/20]
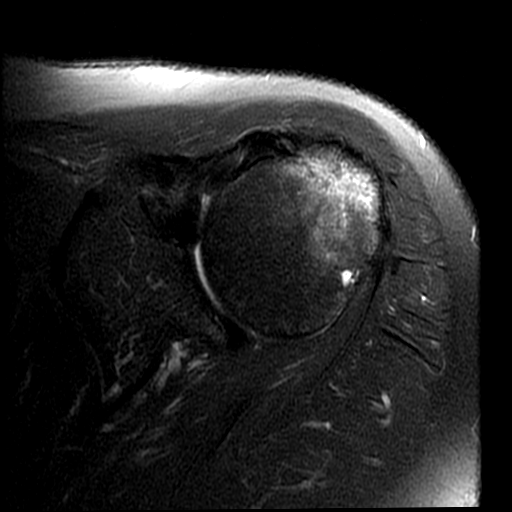
[im 17/20]
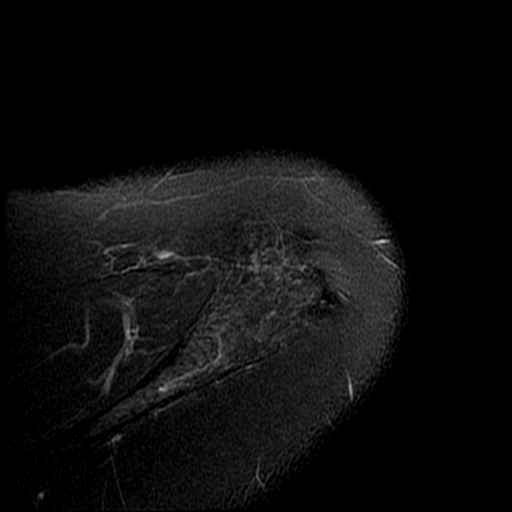

[Series 5: T2 fat-sat · oblique · 4.0mm · 0.29mm/px · 3 of 18 slices shown (2 of 3)]
[im 3/18]
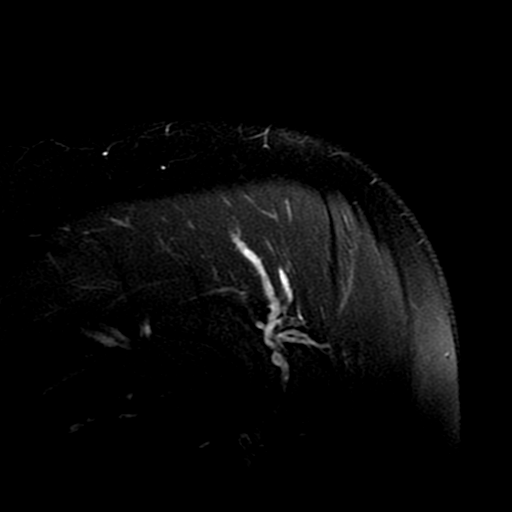
[im 9/18]
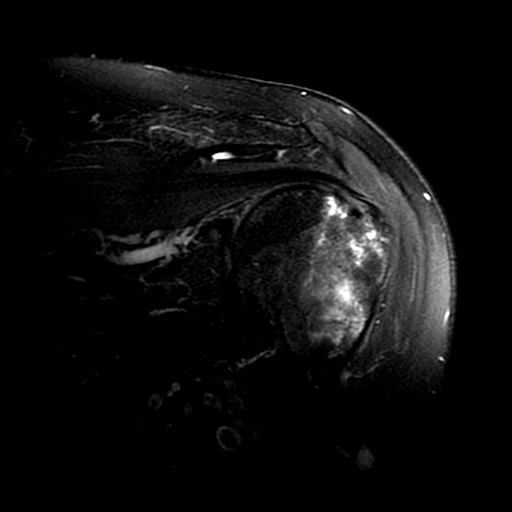
[im 15/18]
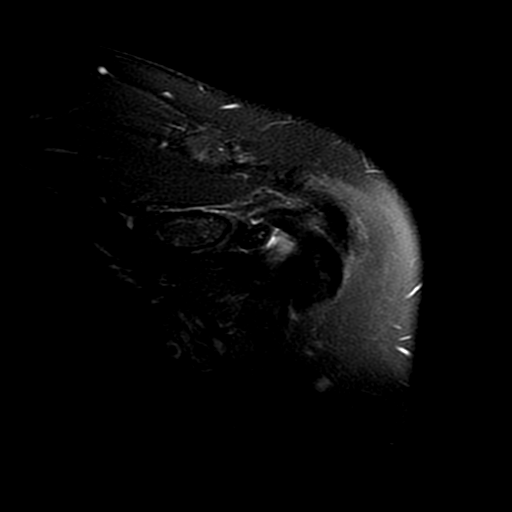

[Series 6: PD · oblique · 4.0mm · 0.29mm/px · 7 of 18 slices shown]
[im 1/18]
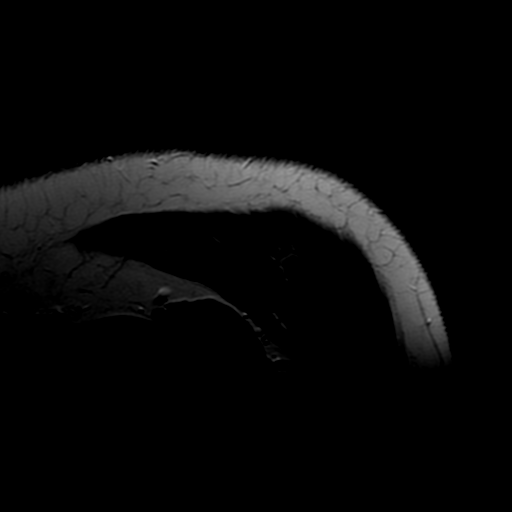
[im 3/18]
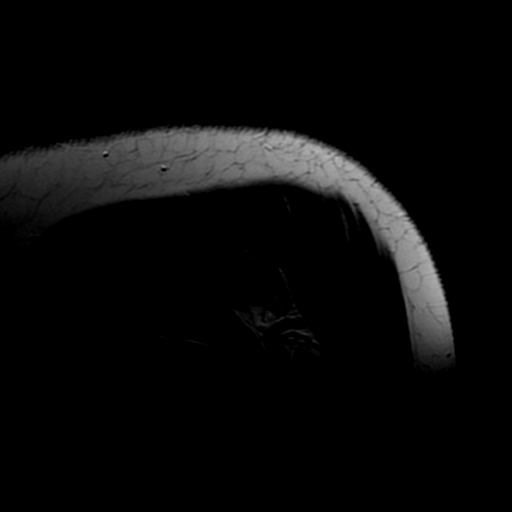
[im 6/18]
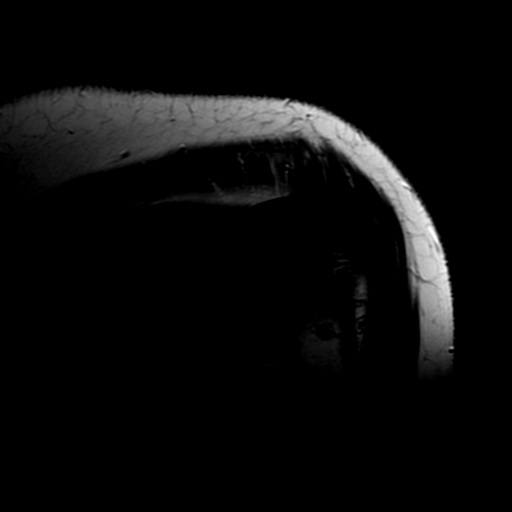
[im 9/18]
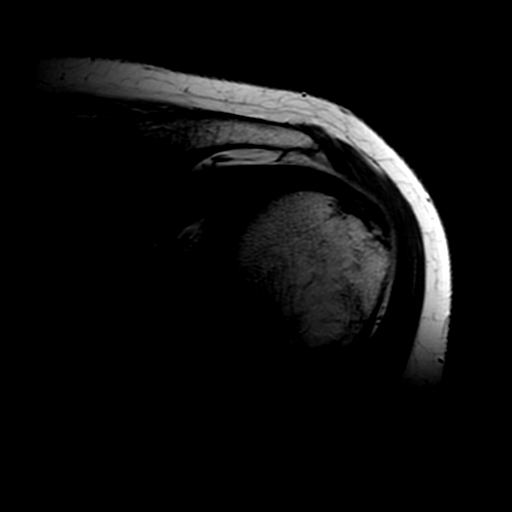
[im 12/18]
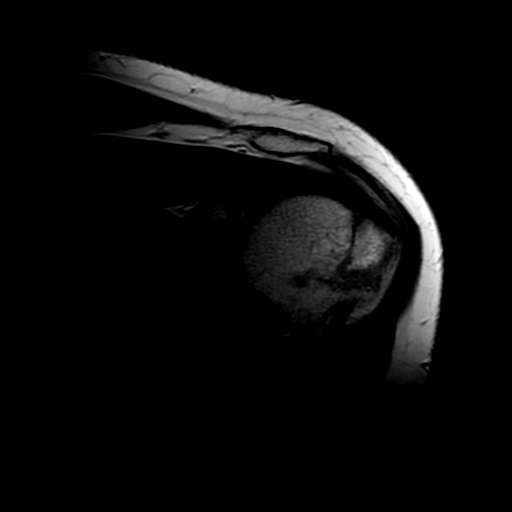
[im 15/18]
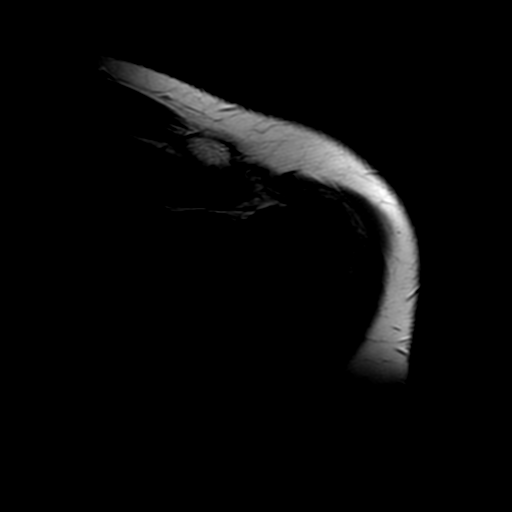
[im 18/18]
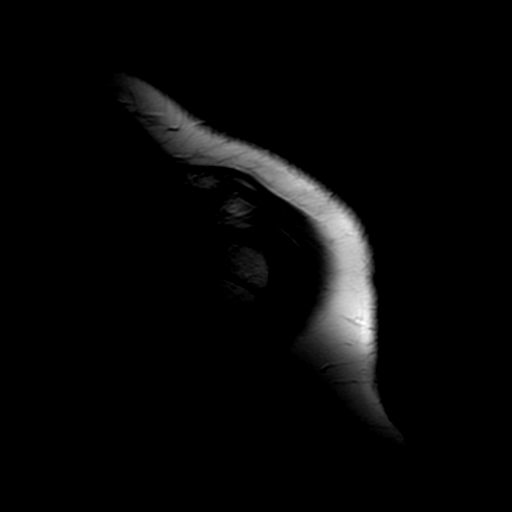

[Series 8: T2 fat-sat · oblique · 4.0mm · 0.29mm/px · 3 of 21 slices shown (3 of 3)]
[im 3/21]
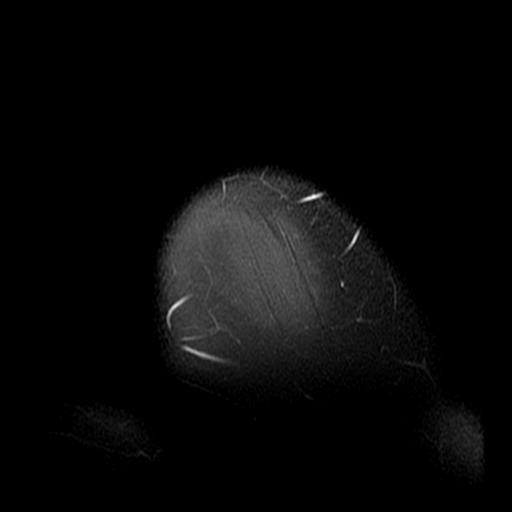
[im 11/21]
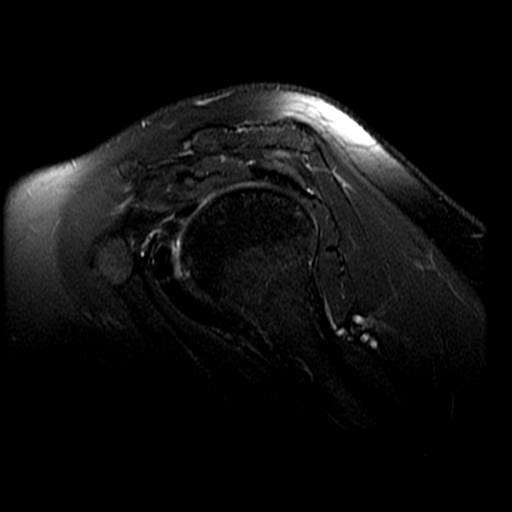
[im 18/21]
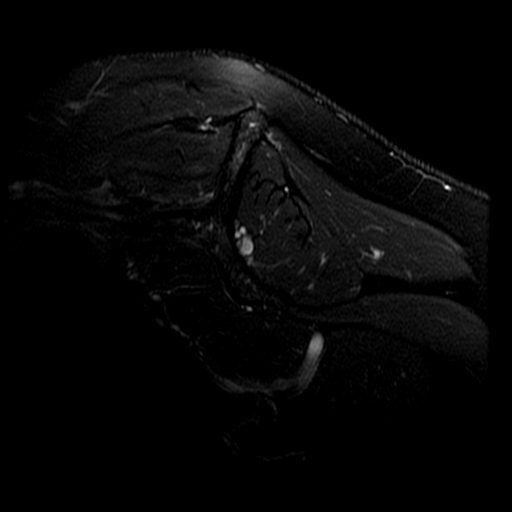

[19 of 40 positions shown; findings below may reference images not displayed]

FINDINGS: Rotator cuff: Mild supraspinatus, infraspinatus, and subscapularis
tendinopathy without tear.

Muscles:  Unremarkable

Biceps long head:  Unremarkable

Acromioclavicular Joint: Prior acromioplasty. Subacromial morphology
is type 2 (curved).

Glenohumeral Joint: Mild degenerative glenohumeral chondral
thinning. No joint effusion.

Labrum:  Grossly unremarkable

Bones: Healing nondisplaced greater tuberosity fracture, images 5
through 7 series 7. The possibility of a scapular fracture was
raised on the prior humerus radiograph, but no fracture the
visualized portion a scapula is visible on today's MRI

Other: None
IMPRESSION: 1. Healing nondisplaced fracture the greater tuberosity of the
humerus.
2. Mild supraspinatus, infraspinatus, and subscapularis
tendinopathy.
3. Mild degenerative glenohumeral chondral thinning.
4. Prior acromioplasty.

## 2017-12-23 ENCOUNTER — Ambulatory Visit (INDEPENDENT_AMBULATORY_CARE_PROVIDER_SITE_OTHER): Payer: 59 | Admitting: Internal Medicine

## 2017-12-23 ENCOUNTER — Ambulatory Visit (INDEPENDENT_AMBULATORY_CARE_PROVIDER_SITE_OTHER)
Admission: RE | Admit: 2017-12-23 | Discharge: 2017-12-23 | Disposition: A | Discharge status: Home / Self Care | Payer: 59 | Source: Ambulatory Visit | Attending: Internal Medicine | Admitting: Internal Medicine

## 2017-12-23 ENCOUNTER — Other Ambulatory Visit (INDEPENDENT_AMBULATORY_CARE_PROVIDER_SITE_OTHER): Payer: 59

## 2017-12-23 ENCOUNTER — Encounter: Payer: Self-pay | Admitting: Internal Medicine

## 2017-12-23 VITALS — BP 122/90 | HR 63 | Temp 98.0°F | Ht 66.0 in | Wt 185.0 lb

## 2017-12-23 DIAGNOSIS — R0602 Shortness of breath: Secondary | ICD-10-CM

## 2017-12-23 LAB — COMPREHENSIVE METABOLIC PANEL
ALBUMIN: 4.6 g/dL (ref 3.5–5.2)
ALK PHOS: 70 U/L (ref 39–117)
ALT: 20 U/L (ref 0–35)
AST: 21 U/L (ref 0–37)
BILIRUBIN TOTAL: 1.1 mg/dL (ref 0.2–1.2)
BUN: 12 mg/dL (ref 6–23)
CO2: 29 mEq/L (ref 19–32)
Calcium: 9.4 mg/dL (ref 8.4–10.5)
Chloride: 101 mEq/L (ref 96–112)
Creatinine, Ser: 0.77 mg/dL (ref 0.40–1.20)
GFR: 80.03 mL/min (ref >60.00)
GLUCOSE: 107 mg/dL — AB (ref 70–99)
Potassium: 4.2 mEq/L (ref 3.5–5.1)
SODIUM: 138 meq/L (ref 135–145)
TOTAL PROTEIN: 7.6 g/dL (ref 6.0–8.3)

## 2017-12-23 LAB — CBC
HCT: 44.3 % (ref 36.0–46.0)
HEMOGLOBIN: 15.5 g/dL — AB (ref 12.0–15.0)
MCHC: 35 g/dL (ref 30.0–36.0)
MCV: 90.3 fl (ref 78.0–100.0)
Platelets: 248 10*3/uL (ref 150.0–400.0)
RBC: 4.91 Mil/uL (ref 3.87–5.11)
RDW: 12.7 % (ref 11.5–15.5)
WBC: 6.3 10*3/uL (ref 4.0–10.5)

## 2017-12-23 LAB — T4, FREE: Free T4: 0.84 ng/dL (ref 0.60–1.60)

## 2017-12-23 LAB — TROPONIN I: TNIDX: 0 ug/L (ref 0.00–0.06)

## 2017-12-23 LAB — TSH: TSH: 2.92 u[IU]/mL (ref 0.35–4.50)

## 2017-12-23 LAB — BRAIN NATRIURETIC PEPTIDE: PRO B NATRI PEPTIDE: 27 pg/mL (ref 0.0–100.0)

## 2017-12-23 NOTE — Progress Notes (Signed)
   Subjective:    Patient ID: Kim SkiffLaura S Hersch, female    DOB: 10/19/1951, 67 y.o.   MRN: [id number]  HPI The patient is a 67 YO female coming in for chest pressure in the middle of her chest. Going on for several days at this time. She has had this in the past with stress or lack of sleep. She denies those things recently but is still recovering from a cold. The cold was 1-2 weeks ago and she feels fairly normal. Still coughing some non-productive. She did an EKG of herself at work and noted that it was normal and she did not see any problems with it. She denies pain with exertion and denies pain currently. Some pressure yesterday was the last time. Has been daily for weeks. Does have SOB but no cough. She does have some nose drainage still. Had not been sleeping well during the cold several weeks ago. Denies long travel recently. No pain with breathing.   Review of Systems  Constitutional: Negative.   HENT: Negative.   Eyes: Negative.   Respiratory: Positive for chest tightness and shortness of breath. Negative for cough.   Cardiovascular: Negative for chest pain, palpitations and leg swelling.  Gastrointestinal: Negative for abdominal distention, abdominal pain, constipation, diarrhea, nausea and vomiting.  Musculoskeletal: Negative.   Skin: Negative.   Neurological: Negative.   Psychiatric/Behavioral: Negative.      Objective:   Physical Exam  Constitutional: She is oriented to person, place, and time. She appears well-developed and well-nourished.  HENT:  Head: Normocephalic and atraumatic.  Eyes: EOM are normal.  Neck: Normal range of motion.  Cardiovascular: Normal rate and regular rhythm.  Pulmonary/Chest: Effort normal and breath sounds normal. No respiratory distress. She has no wheezes. She has no rales.  Abdominal: Soft. Bowel sounds are normal. She exhibits no distension. There is no tenderness. There is no rebound.  Musculoskeletal: She exhibits no edema.  Neurological: She is  alert and oriented to person, place, and time. Coordination normal.  Skin: Skin is warm and dry.  Psychiatric: She has a normal mood and affect.   Vitals:   12/23/17 1112  BP: 122/90  Pulse: 63  Temp: 98 F (36.7 C)  TempSrc: Oral  SpO2: 98%  Weight: 185 lb (83.9 kg)  Height: 5\' 6"  (1.676 m)      Assessment & Plan:

## 2017-12-23 NOTE — Patient Instructions (Signed)
We will check the labs and the chest x-ray today. ? ? ?

## 2017-12-24 DIAGNOSIS — H2513 Age-related nuclear cataract, bilateral: Secondary | ICD-10-CM | POA: Diagnosis not present

## 2017-12-24 DIAGNOSIS — H04123 Dry eye syndrome of bilateral lacrimal glands: Secondary | ICD-10-CM | POA: Diagnosis not present

## 2017-12-24 DIAGNOSIS — H43813 Vitreous degeneration, bilateral: Secondary | ICD-10-CM | POA: Diagnosis not present

## 2017-12-26 DIAGNOSIS — R0602 Shortness of breath: Secondary | ICD-10-CM | POA: Insufficient documentation

## 2017-12-26 NOTE — Assessment & Plan Note (Signed)
Checking CXR today, she did not feel the need for another EKG which I offered. Checking CMP, CBC, troponin, BNP, no suspicion for DVT/PE. If this is persistent may need stress testing.

## 2018-01-05 MED FILL — METOPROLOL SUCC ER 25 MG TA: 25 | 90 days supply | Qty: 90 | Fill #2

## 2018-01-05 MED FILL — ASPIRIN ADULT LOW STRENGTH: 81 | 90 days supply | Qty: 90 | Fill #3

## 2018-01-06 ENCOUNTER — Ambulatory Visit
Admission: RE | Admit: 2018-01-06 | Discharge: 2018-01-06 | Disposition: A | Discharge status: Home / Self Care | Payer: 59 | Source: Ambulatory Visit | Attending: Internal Medicine | Admitting: Internal Medicine

## 2018-01-06 ENCOUNTER — Other Ambulatory Visit: Payer: Self-pay | Admitting: Internal Medicine

## 2018-01-06 DIAGNOSIS — Z139 Encounter for screening, unspecified: Secondary | ICD-10-CM

## 2018-01-06 DIAGNOSIS — Z1231 Encounter for screening mammogram for malignant neoplasm of breast: Secondary | ICD-10-CM | POA: Diagnosis not present

## 2018-01-12 DIAGNOSIS — H2511 Age-related nuclear cataract, right eye: Secondary | ICD-10-CM | POA: Diagnosis not present

## 2018-01-12 MED FILL — PROLENSA 0.07% EYE DROPS: 0.07 | 60 days supply | Qty: 3 | Fill #0

## 2018-01-12 MED FILL — DUREZOL 0.05% EYE DROPS: 0.05 | 50 days supply | Qty: 5 | Fill #0

## 2018-01-12 MED FILL — BESIVANCE 0.6% SUSP: 0.6 | 25 days supply | Qty: 5 | Fill #0

## 2018-01-20 DIAGNOSIS — H25811 Combined forms of age-related cataract, right eye: Secondary | ICD-10-CM | POA: Diagnosis not present

## 2018-01-20 DIAGNOSIS — H2511 Age-related nuclear cataract, right eye: Secondary | ICD-10-CM | POA: Diagnosis not present

## 2018-01-28 DIAGNOSIS — H2512 Age-related nuclear cataract, left eye: Secondary | ICD-10-CM | POA: Diagnosis not present

## 2018-02-03 DIAGNOSIS — H25812 Combined forms of age-related cataract, left eye: Secondary | ICD-10-CM | POA: Diagnosis not present

## 2018-02-03 DIAGNOSIS — H2512 Age-related nuclear cataract, left eye: Secondary | ICD-10-CM | POA: Diagnosis not present

## 2018-02-24 ENCOUNTER — Encounter: Payer: Self-pay | Admitting: Internal Medicine

## 2018-02-24 DIAGNOSIS — Z1211 Encounter for screening for malignant neoplasm of colon: Secondary | ICD-10-CM

## 2018-03-09 DIAGNOSIS — Z1211 Encounter for screening for malignant neoplasm of colon: Secondary | ICD-10-CM | POA: Diagnosis not present

## 2018-03-09 LAB — COLOGUARD: COLOGUARD: NEGATIVE

## 2018-03-25 ENCOUNTER — Telehealth: Payer: Self-pay | Admitting: Internal Medicine

## 2018-03-25 ENCOUNTER — Encounter: Payer: Self-pay | Admitting: Internal Medicine

## 2018-03-25 NOTE — Telephone Encounter (Signed)
Pt return call and results of the Cologuard given to her. Pt voiced understanding.

## 2018-03-25 NOTE — Progress Notes (Signed)
Abstracted and sent to scan LVM for patient to call back for result

## 2018-03-25 NOTE — Telephone Encounter (Signed)
noted 

## 2018-04-09 DIAGNOSIS — Z6829 Body mass index (BMI) 29.0-29.9, adult: Secondary | ICD-10-CM | POA: Diagnosis not present

## 2018-04-09 DIAGNOSIS — Z01419 Encounter for gynecological examination (general) (routine) without abnormal findings: Secondary | ICD-10-CM | POA: Diagnosis not present

## 2018-05-07 ENCOUNTER — Other Ambulatory Visit: Payer: Self-pay | Admitting: Internal Medicine

## 2018-05-07 MED FILL — METOPROLOL SUCCINATE ER 25: 25 | 90 days supply | Qty: 90 | Fill #3

## 2018-05-07 MED FILL — ASPIRIN ADULT LOW STRENGTH: 81 | 90 days supply | Qty: 90 | Fill #4

## 2018-05-08 MED FILL — MAXIMUM EPA 1000 MG CAPS: 1000 | 100 days supply | Qty: 100 | Fill #0

## 2018-05-08 MED FILL — B COMPLEX TABLET: 100 days supply | Qty: 100 | Fill #0

## 2018-05-08 MED FILL — CALCIUM 600 + VIT D 400 TAB: 600-400 | 150 days supply | Qty: 150 | Fill #0

## 2018-09-09 ENCOUNTER — Other Ambulatory Visit: Payer: Self-pay

## 2018-09-09 MED ORDER — ASPIRIN 81 MG PO TBEC: 81.0000 mg | DELAYED_RELEASE_TABLET | Freq: Every day | ORAL | 99 refills | Status: AC

## 2018-09-09 MED ORDER — METOPROLOL SUCCINATE ER 25 MG PO TB24: 25.0000 mg | ORAL_TABLET | Freq: Every day | ORAL | 0 refills | Status: DC

## 2018-09-09 MED FILL — ASPIRIN ADULT LOW STRENGTH: 81 | 30 days supply | Qty: 30 | Fill #0

## 2018-09-09 MED FILL — METOPROLOL SUCCINATE ER 25: 25 | 90 days supply | Qty: 90 | Fill #0

## 2018-10-22 MED FILL — ASPIRIN ADULT LOW STRENGTH: 81 | 30 days supply | Qty: 30 | Fill #1

## 2018-11-05 ENCOUNTER — Other Ambulatory Visit (INDEPENDENT_AMBULATORY_CARE_PROVIDER_SITE_OTHER): Payer: 59

## 2018-11-05 ENCOUNTER — Ambulatory Visit (INDEPENDENT_AMBULATORY_CARE_PROVIDER_SITE_OTHER): Payer: 59 | Admitting: Internal Medicine

## 2018-11-05 ENCOUNTER — Encounter: Payer: Self-pay | Admitting: Internal Medicine

## 2018-11-05 VITALS — BP 118/84 | HR 63 | Temp 98.4°F | Ht 66.0 in | Wt 189.0 lb

## 2018-11-05 DIAGNOSIS — R002 Palpitations: Secondary | ICD-10-CM

## 2018-11-05 DIAGNOSIS — Z0000 Encounter for general adult medical examination without abnormal findings: Secondary | ICD-10-CM | POA: Diagnosis not present

## 2018-11-05 DIAGNOSIS — Z23 Encounter for immunization: Secondary | ICD-10-CM

## 2018-11-05 LAB — COMPREHENSIVE METABOLIC PANEL
ALK PHOS: 62 U/L (ref 39–117)
ALT: 13 U/L (ref 0–35)
AST: 15 U/L (ref 0–37)
Albumin: 4.4 g/dL (ref 3.5–5.2)
BILIRUBIN TOTAL: 0.9 mg/dL (ref 0.2–1.2)
BUN: 16 mg/dL (ref 6–23)
CO2: 29 mEq/L (ref 19–32)
CREATININE: 0.82 mg/dL (ref 0.40–1.20)
Calcium: 9.6 mg/dL (ref 8.4–10.5)
Chloride: 102 mEq/L (ref 96–112)
GFR: 74.23 mL/min (ref >60.00)
GLUCOSE: 108 mg/dL — AB (ref 70–99)
Potassium: 4.6 mEq/L (ref 3.5–5.1)
Sodium: 138 mEq/L (ref 135–145)
TOTAL PROTEIN: 7.3 g/dL (ref 6.0–8.3)

## 2018-11-05 LAB — CBC
HCT: 43.5 % (ref 36.0–46.0)
HEMOGLOBIN: 15.1 g/dL — AB (ref 12.0–15.0)
MCHC: 34.8 g/dL (ref 30.0–36.0)
MCV: 91.8 fl (ref 78.0–100.0)
Platelets: 220 10*3/uL (ref 150.0–400.0)
RBC: 4.74 Mil/uL (ref 3.87–5.11)
RDW: 13.3 % (ref 11.5–15.5)
WBC: 4.8 10*3/uL (ref 4.0–10.5)

## 2018-11-05 LAB — LIPID PANEL
Cholesterol: 196 mg/dL (ref 0–200)
HDL: 78.5 mg/dL (ref >39.00)
LDL Cholesterol: 105 mg/dL — ABNORMAL HIGH (ref 0–99)
NONHDL: 117.53
Total CHOL/HDL Ratio: 2
Triglycerides: 63 mg/dL (ref 0.0–149.0)
VLDL: 12.6 mg/dL (ref 0.0–40.0)

## 2018-11-05 LAB — HEMOGLOBIN A1C: Hgb A1c MFr Bld: 5.5 % (ref 4.6–6.5)

## 2018-11-05 MED ORDER — METOPROLOL SUCCINATE ER 25 MG PO TB24: 25.0000 mg | ORAL_TABLET | Freq: Every day | ORAL | 3 refills | Status: DC

## 2018-11-05 NOTE — Patient Instructions (Signed)

## 2018-11-05 NOTE — Progress Notes (Signed)
   Subjective:    Patient ID: Marjie SkiffLaura S Markwood, female    DOB: 10/09/1951, 67 y.o.   MRN: [id number]  HPI The patient is a 67 YO female coming in for physical.   [facility], [facility] history reviewed and updated.   Review of Systems  Constitutional: Negative.   HENT: Negative.   Eyes: Negative.   Respiratory: Negative for cough, chest tightness and shortness of breath.   Cardiovascular: Negative for chest pain, palpitations and leg swelling.  Gastrointestinal: Negative for abdominal distention, abdominal pain, constipation, diarrhea, nausea and vomiting.  Musculoskeletal: Negative.   Skin: Negative.   Neurological: Negative.   Psychiatric/Behavioral: Negative.       Objective:   Physical Exam  Constitutional: She is oriented to person, place, and time. She appears well-developed and well-nourished.  HENT:  Head: Normocephalic and atraumatic.  Eyes: EOM are normal.  Neck: Normal range of motion.  Cardiovascular: Normal rate and regular rhythm.  Pulmonary/Chest: Effort normal and breath sounds normal. No respiratory distress. She has no wheezes. She has no rales.  Abdominal: Soft. Bowel sounds are normal. She exhibits no distension. There is no tenderness. There is no rebound.  Musculoskeletal: She exhibits no edema.  Neurological: She is alert and oriented to person, place, and time. Coordination normal.  Skin: Skin is warm and dry.  Psychiatric: She has a normal mood and affect.   Vitals:   11/05/18 0814  BP: 118/84  Pulse: 63  Temp: 98.4 F (36.9 C)  TempSrc: Oral  SpO2: 99%  Weight: 189 lb (85.7 kg)  Height: 5\' 6"  (1.676 m)      Assessment & Plan:  Tdap and prevnar 13 given at visit

## 2018-11-05 NOTE — Assessment & Plan Note (Signed)
Taking metoprolol daily and refilled today. Denies palpitations today.

## 2018-11-05 NOTE — Assessment & Plan Note (Signed)
Flu shot up to date. Pneumonia 13 given to start series. Shingrix counseled. Tetanus given. Cologuard up to date. Mammogram up to date, pap smear up to date with gyn and dexa counseled. Counseled about sun safety and mole surveillance. Counseled about the dangers of distracted driving. Given 10 year screening recommendations.

## 2019-01-04 MED FILL — METOPROLOL SUCCINATE ER 25: 25 | 90 days supply | Qty: 90 | Fill #0 | Status: TO

## 2019-01-04 MED FILL — ASPIRIN ADULT LOW STRENGTH: 81 | 30 days supply | Qty: 30 | Fill #2

## 2019-02-08 ENCOUNTER — Other Ambulatory Visit: Payer: Self-pay | Admitting: Internal Medicine

## 2019-02-08 ENCOUNTER — Ambulatory Visit
Admission: RE | Admit: 2019-02-08 | Discharge: 2019-02-08 | Disposition: A | Discharge status: Home / Self Care | Payer: 59 | Source: Ambulatory Visit | Attending: Internal Medicine | Admitting: Internal Medicine

## 2019-02-08 DIAGNOSIS — Z1231 Encounter for screening mammogram for malignant neoplasm of breast: Secondary | ICD-10-CM

## 2019-02-08 MED FILL — ASPIRIN ADULT LOW STRENGTH: 81 | 30 days supply | Qty: 30 | Fill #3 | Status: TO

## 2019-03-10 MED FILL — ASPIRIN ADULT LOW STRENGTH: 81 | 30 days supply | Qty: 30 | Fill #0

## 2019-03-31 MED FILL — B COMPLEX TABLET: 100 days supply | Qty: 100 | Fill #0

## 2019-03-31 MED FILL — MAXIMUM EPA 1000 MG CAPS: 1000 | 100 days supply | Qty: 100 | Fill #0

## 2019-03-31 MED FILL — CALCIUM 600 + VIT D 400 TAB: 600-400 | 150 days supply | Qty: 150 | Fill #0

## 2019-03-31 MED FILL — METOPROLOL SUCCINATE ER 25: 25 | 90 days supply | Qty: 90 | Fill #0

## 2019-04-03 MED FILL — ASPIRIN EC 81 MG TBEC: 81 | 30 days supply | Qty: 30 | Fill #1

## 2019-04-23 ENCOUNTER — Telehealth: Payer: 59 | Admitting: Nurse Practitioner

## 2019-04-23 DIAGNOSIS — L247 Irritant contact dermatitis due to plants, except food: Secondary | ICD-10-CM

## 2019-04-23 MED ORDER — PREDNISONE 10 MG (21) PO TBPK: ORAL_TABLET | ORAL | 0 refills | Status: DC

## 2019-04-23 NOTE — Progress Notes (Signed)
E Visit for Rash  We are sorry that you are not feeling well. Here is how we plan to help!  Based on what you shared with me it looks like you have contact dermatitis.  Contact dermatitis is a skin rash caused by something that touches the skin and causes irritation or inflammation.  Your skin may be red, swollen, dry, cracked, and itch.  The rash should go away in a few days but can last a few weeks.  If you get a rash, it's important to figure out what caused it so the irritant can be avoided in the future.      Prednisone 10 mg daily for 6 days (see taper instructions below)  Directions for 6 day taper: Day 1: 2 tablets before breakfast, 1 after both lunch & dinner and 2 at bedtime Day 2: 1 tab before breakfast, 1 after both lunch & dinner and 2 at bedtime Day 3: 1 tab at each meal & 1 at bedtime Day 4: 1 tab at breakfast, 1 at lunch, 1 at bedtime Day 5: 1 tab at breakfast & 1 tab at bedtime Day 6: 1 tab at breakfast    HOME CARE:   Take cool showers and avoid direct sunlight.  Apply cool compress or wet dressings.  Take a bath in an oatmeal bath.  Sprinkle content of one Aveeno packet under running faucet with comfortably warm water.  Bathe for 15-20 minutes, 1-2 times daily.  Pat dry with a towel. Do not rub the rash.  Use hydrocortisone cream.  Take an antihistamine like Benadryl for widespread rashes that itch.  The adult dose of Benadryl is 25-50 mg by mouth 4 times daily.  Caution:  This type of medication may cause sleepiness.  Do not drink alcohol, drive, or operate dangerous machinery while taking antihistamines.  Do not take these medications if you have prostate enlargement.  Read package instructions thoroughly on all medications that you take.  GET HELP RIGHT AWAY IF:   Symptoms don't go away after treatment.  Severe itching that persists.  If you rash spreads or swells.  If you rash begins to smell.  If it blisters and opens or develops a yellow-brown  crust.  You develop a fever.  You have a sore throat.  You become short of breath.  MAKE SURE YOU:  Understand these instructions. Will watch your condition. Will get help right away if you are not doing well or get worse.  Thank you for choosing an e-visit. Your e-visit answers were reviewed by a board certified advanced clinical practitioner to complete your personal care plan. Depending upon the condition, your plan could have included both over the counter or prescription medications. Please review your pharmacy choice. Be sure that the pharmacy you have chosen is open so that you can pick up your prescription now.  If there is a problem you may message your provider in MyChart to have the prescription routed to another pharmacy. Your safety is important to us. If you have drug allergies check your prescription carefully.   For the next 24 hours, you can use MyChart to ask questions about today's visit, request a non-urgent call back, or ask for a work or school excuse from your e-visit provider. You will get an email in the next two days asking about your experience. I hope that your e-visit has been valuable and will speed your recovery.    5-10 minutes spent reviewing and documenting in chart.   

## 2019-05-03 ENCOUNTER — Ambulatory Visit (INDEPENDENT_AMBULATORY_CARE_PROVIDER_SITE_OTHER): Payer: Medicare HMO | Admitting: Internal Medicine

## 2019-05-03 ENCOUNTER — Encounter: Payer: Self-pay | Admitting: Physician Assistant

## 2019-05-03 ENCOUNTER — Encounter: Payer: Self-pay | Admitting: Internal Medicine

## 2019-05-03 ENCOUNTER — Other Ambulatory Visit: Payer: Self-pay

## 2019-05-03 ENCOUNTER — Telehealth: Payer: 59 | Admitting: Physician Assistant

## 2019-05-03 DIAGNOSIS — L259 Unspecified contact dermatitis, unspecified cause: Secondary | ICD-10-CM | POA: Insufficient documentation

## 2019-05-03 DIAGNOSIS — L247 Irritant contact dermatitis due to plants, except food: Secondary | ICD-10-CM

## 2019-05-03 DIAGNOSIS — L237 Allergic contact dermatitis due to plants, except food: Secondary | ICD-10-CM

## 2019-05-03 MED ORDER — PREDNISONE 20 MG PO TABS: ORAL_TABLET | ORAL | 0 refills | Status: DC

## 2019-05-03 NOTE — Assessment & Plan Note (Signed)
Rx prednisone and call back if not improved or worsening. Benadryl for itching and hydrocortisone.

## 2019-05-03 NOTE — Progress Notes (Signed)
Virtual Visit via Video Note  I connected with Kim Anderson on 05/03/19 at 10:00 AM EDT by a video enabled telemedicine application and verified that I am speaking with the correct person using two identifiers.  The patient and the provider were at separate locations throughout the entire encounter.   I discussed the limitations of evaluation and management by telemedicine and the availability of in person appointments. The patient expressed understanding and agreed to proceed.  History of Present Illness: The patient is a 68 y.o. female with visit for rash/poison ivy. She was exposed about 2-3 weeks ago while clearing some weeds/plants in the yard. She has always had strong reactions with this. She did e-visit about 1-2 weeks ago and got prednisone 6 day course. She did take this and it helped but did not rid symptoms. When she stopped 3-4 days ago the rash started spreading again and is itching terribly. She is trying not to scratch. No pain. Denies fevers or chills or SOB or cough. Overall it is worsening. Has tried prednisone, otc hydrocortisone cream, benadryl.   Observations/Objective: Appearance: normal, red rash on the chest and arms consistent with poison ivy/oak, breathing appears normal, casual grooming, abdomen does not appear distended, throat normal, memory normal, mental status is A and O times 3  Assessment and Plan: See problem oriented charting  Follow Up Instructions: rx prednisone  I discussed the assessment and treatment plan with the patient. The patient was provided an opportunity to ask questions and all were answered. The patient agreed with the plan and demonstrated an understanding of the instructions.   The patient was advised to call back or seek an in-person evaluation if the symptoms worsen or if the condition fails to improve as anticipated.  Myrlene Broker, MD

## 2019-05-03 NOTE — Progress Notes (Signed)
Based on what you shared with me, I feel your condition warrants further evaluation and I recommend that you be seen for a face to face office visit.  Ms. Hemphill, Sorry you are still dealing this! However, it is recommended that you have a face to face evaluation. Steroids are associated with many side effects including effects on vision, blood pressure, blood sugar, sleep and appetite changes, mood changes, and can lower your resistance to fight infection. Therefore, it is important to have a face to face evaluation to assess the need for the additional steroids.    NOTE: If you entered your credit card information for this eVisit, you will not be charged. You may see a "hold" on your card for the $35 but that hold will drop off and you will not have a charge processed.  If you are having a true medical emergency please call 911.  If you need an urgent face to face visit, [facility] has four urgent care centers for your convenience.    PLEASE NOTE: THE [facility] LOCATIONS AND URGENT CARE CLINICS DO NOT HAVE THE TESTING FOR CORONAVIRUS COVID19 AVAILABLE.  IF YOU FEEL YOU NEED THIS TEST YOU MUST GO TO A TRIAGE LOCATION AT ONE OF THE HOSPITAL EMERGENCY DEPARTMENTS   [email or web address] to reserve your spot online an avoid wait times  [facility] [address] [place], [place] [address] Modified hours of operation: Monday-Friday, 12 PM to 6 PM  Saturday & Sunday 10 AM to 4 PM *Across the street from [employer]  [employer] (New Address!) [address] [place], [place] [address] *Just off [employer], across the road from [place]* Modified hours of operation: Monday-Friday, 12 PM to 6 PM  Closed Saturday & Sunday  [facility]'s modified hours of operation will be in effect from May 1 until May 31   The following sites will take your insurance:  . [facility]  [phone] Get Driving Directions Find a  Provider at this Location  [address] [place], [place] [address] . 10 am to 8 pm Monday-Friday . 12 pm to 8 pm Saturday-Sunday   . [facility] at [facility]  [phone] Get Driving Directions Find a Provider at this Location  [address] [place], [place] [address] . 8 am to 8 pm Monday-Friday . 9 am to 6 pm Saturday . 11 am to 6 pm Sunday   . [facility]gent Care at [facility]  [phone] Get Driving Directions  [address] [place], [place] [address] . 8 am to 8 pm Monday-Friday . 8 am to 4 pm Saturday-Sunday   Your e-visit answers were reviewed by a board certified advanced clinical practitioner to complete your personal care plan.  Thank you for using e-Visits. I have spent 7 min in completion and review of this note- [facility]

## 2019-07-28 DIAGNOSIS — Z961 Presence of intraocular lens: Secondary | ICD-10-CM | POA: Diagnosis not present

## 2019-07-28 DIAGNOSIS — H524 Presbyopia: Secondary | ICD-10-CM | POA: Diagnosis not present

## 2019-07-28 DIAGNOSIS — R69 Illness, unspecified: Secondary | ICD-10-CM | POA: Diagnosis not present

## 2019-07-28 DIAGNOSIS — H52223 Regular astigmatism, bilateral: Secondary | ICD-10-CM | POA: Diagnosis not present

## 2019-09-06 DIAGNOSIS — H52223 Regular astigmatism, bilateral: Secondary | ICD-10-CM | POA: Diagnosis not present

## 2019-09-06 DIAGNOSIS — H524 Presbyopia: Secondary | ICD-10-CM | POA: Diagnosis not present

## 2019-10-25 DIAGNOSIS — R69 Illness, unspecified: Secondary | ICD-10-CM | POA: Diagnosis not present

## 2019-11-04 NOTE — Progress Notes (Addendum)
Subjective:   Kim Anderson is a 68 y.o. female who presents for an Initial Medicare Annual Wellness Visit.  I connected with patient by a telephone and verified that I am speaking with the correct person using two identifiers. Patient stated full name and DOB. Patient gave permission to continue with telephonic visit. Patient's location was at home and Nurse's location was at [place] office. Participants during this visit included patient and nurse.  Review of Systems     Cardiac Risk Factors include: advanced age (>43mn, >>31women) Sleep patterns: gets up 1-2 times nightly to void and sleeps 5-7 hours nightly.    Home Safety/Smoke Alarms: Feels safe in home. Smoke alarms in place.  Living environment; residence and Firearm Safety: 1-story house/ trailer. Lives with husband, no needs for DME, good support system Seat Belt Safety/Bike Helmet: Wears seat belt.     Objective:    There were no vitals filed for this visit. There is no height or weight on file to calculate BMI.  Advanced Directives 11/05/2019 07/31/2016 06/14/2016 04/13/2015  Does Patient Have a Medical Advance Directive? Yes Yes No Yes  Type of AParamedicof [place] will - - -  Copy of [place] Chart? No - copy requested No - copy requested - -    Current Medications (verified) Outpatient Encounter Medications as of 11/05/2019  Medication Sig  . aspirin (ASPIR-LOW) 81 MG EC tablet Take 1 tablet (81 mg total) by mouth daily. Swallow whole.  . B Complex Vitamins (VITAMIN B COMPLEX) TABS TAKE 1 TABLET BY MOUTH DAILY.  . Calcium Carbonate-Vitamin D 600-400 MG-UNIT tablet TAKE 1 TABLET BY MOUTH ONCE DAILY  . ibuprofen (ADVIL) 100 MG/5ML suspension Take 200 mg by mouth every 4 (four) hours as needed.  . metoprolol succinate (TOPROL-XL) 25 MG 24 hr tablet Take 1 tablet (25 mg total) by mouth daily.  . Omega-3 Fatty Acids (FISH OIL) 1000 MG CAPS TAKE 1 CAPSULE BY MOUTH  DAILY.  . [DISCONTINUED] predniSONE (DELTASONE) 20 MG tablet Days 1-5 take 2 pills daily, days 6-10 take 1 pill daily. (Patient not taking: Reported on 11/05/2019)   No facility-administered encounter medications on file as of 11/05/2019.     Allergies (verified) Amoxicillin, Ampicillin, Cephalexin, Meperidine hcl, Ofloxacin, and Sulfamethoxazole-trimethoprim   History: Past Medical History:  Diagnosis Date  . History of chest pain   . Mitral valve prolapse    responded to beta-blockers. no evidence of this on 2-d echo/ happened long ago, has not been an issue for years   . Palpitations    Past Surgical History:  Procedure Laterality Date  . arm fracture Left 2017  . CHOLECYSTECTOMY  1997  . COLONOSCOPY  11/02/2007   normal  . LEFT SHOULDER ACROMINOPLASTY Left    cleaned it up   Family History  Problem Relation Age of Onset  . Diabetes Other        parent  . Hypertension Other        parent  . Hemochromatosis Brother   . Diabetes Mother   . Cancer Mother   . Multiple myeloma Mother    Social History   Socioeconomic History  . Marital status: Married    Spouse name: Not on file  . Number of children: 4  . Years of education: Not on file  . Highest education level: Not on file  Occupational History  . Occupation: [employer] [place]  on master's  Social Needs  . Financial resource strain: Not hard at all  . Food insecurity    Worry: Never true    Inability: Never true  . Transportation needs    Medical: No    Non-medical: No  Tobacco Use  . Smoking status: Never Smoker  . Smokeless tobacco: Never Used  Substance and Sexual Activity  . Alcohol use: No  . Drug use: No  . Sexual activity: Not Currently  Lifestyle  . Physical activity    Days per week: 4 days    Minutes per session: 60 min  . Stress: Not at all  Relationships  . Social connections    Talks on phone: More than three times a week    Gets together: More than  three times a week    Attends religious service: 1 to 4 times per year    Active member of club or organization: Yes    Attends meetings of clubs or organizations: 1 to 4 times per year    Relationship status: Married  Other Topics Concern  . Not on file  Social History Narrative   Lives with spouse    Tobacco Counseling Counseling given: Not Answered  Activities of Daily Living In your present state of health, do you have any difficulty performing the following activities: 11/05/2019  Hearing? N  Vision? N  Difficulty concentrating or making decisions? N  Walking or climbing stairs? N  Dressing or bathing? N  Doing errands, shopping? N  Preparing Food and eating ? N  Using the Toilet? N  In the past six months, have you accidently leaked urine? N  Do you have problems with loss of bowel control? N  Managing your Medications? N  Managing your Finances? N  Housekeeping or managing your Housekeeping? N  Some recent data might be hidden     Immunizations and Health Maintenance Immunization History  Administered Date(s) Administered  . Influenza Whole 10/18/2010  . Influenza,inj,Quad PF,6+ Mos 09/01/2013  . Influenza-Unspecified 08/10/2014, 08/18/2017, 08/29/2018  . Pneumococcal Conjugate-13 11/05/2018  . Tdap 11/05/2018  . Zoster 04/10/2015   Health Maintenance Due  Topic Date Due  . DEXA SCAN  04/08/2018  . INFLUENZA VACCINE  07/03/2019    Patient Care Team: Hoyt Koch, MD as PCP - General (Internal Medicine) Verl Blalock, Marijo Conception, MD (Inactive) (Cardiology) Erline Levine, MD (Neurosurgery) Brien Few, MD as Consulting Physician (Obstetrics and Gynecology)  Indicate any recent Medical Services you may have received from other than [facility] providers in the past year (date may be approximate).     Assessment:   This is a routine wellness examination for Kim Anderson. Physical assessment deferred to PCP.  Hearing/Vision screen  Hearing Screening   '125Hz'$  '250Hz'$   '500Hz'$  '1000Hz'$  '2000Hz'$  '3000Hz'$  '4000Hz'$  '6000Hz'$  '8000Hz'$   Right ear:           Left ear:           Comments: Able to hear conversational tones w/o difficulty. No issues reported.     Vision Screening Comments: appointment yearly   Dietary issues and exercise activities discussed: Current Exercise Habits: Home exercise routine, Type of exercise: walking, Time (Minutes): 60, Frequency (Times/Week): 5, Weekly Exercise (Minutes/Week): 300, Intensity: Mild, Exercise limited by: None identified  Diet (meal preparation, eat out, water intake, caffeinated beverages, dairy products, fruits and vegetables): in general, a "healthy" diet  , well balanced. eats a variety of fruits and vegetables daily, limits salt, fat/cholesterol, sugar,carbohydrates,caffeine, drinks 6-8 glasses of water daily.  Goals   None    Depression Screen PHQ 2/9 Scores 11/05/2019 11/05/2018 05/01/2017 04/13/2015 12/08/2013  PHQ - 2 Score 0 0 0 0 0  PHQ- 9 Score - - 1 - -    Fall Risk Fall Risk  11/05/2019 11/05/2018 04/13/2015 12/08/2013  Falls in the past year? 0 0 No No  Number falls in past yr: 0 - - -  Injury with Fall? 0 - - -   Cognitive Function:       Ad8 score reviewed for issues:  Issues making decisions: no  Less interest in hobbies / activities: no  Repeats questions, stories (family complaining): no  Trouble using ordinary gadgets (microwave, computer, phone):no  Forgets the month or year: no  Mismanaging finances: no  Remembering appts: no  Daily problems with thinking and/or memory: no Ad8 score is= 0  Screening Tests Health Maintenance  Topic Date Due  . DEXA SCAN  04/08/2018  . INFLUENZA VACCINE  07/03/2019  . PNA vac Low Risk Adult (2 of 2 - PPSV23) 11/06/2019  . MAMMOGRAM  02/07/2021  . Fecal DNA (Cologuard)  03/09/2021  . TETANUS/TDAP  11/05/2028  . Hepatitis C Screening  Completed     Plan:     Reviewed health maintenance screenings with patient today and relevant education,  vaccines, and/or referrals were provided.   Continue to eat heart healthy diet (full of fruits, vegetables, whole grains, lean protein, water--limit salt, fat, and sugar intake) and increase physical activity as tolerated.  Continue doing brain stimulating activities (puzzles, reading, adult coloring books, staying active) to keep memory sharp.   I have personally reviewed and noted the following in the patient's chart:   . Medical and social history . Use of alcohol, tobacco or illicit drugs  . Current medications and supplements . Functional ability and status . Nutritional status . Physical activity . Advanced directives . List of other physicians . Screenings to include cognitive, depression, and falls . Referrals and appointments  In addition, I have reviewed and discussed with patient certain preventive protocols, quality metrics, and best practice recommendations. A written personalized care plan for preventive services as well as general preventive health recommendations were provided to patient.     Michiel Cowboy, RN   11/05/2019   Medical screening examination/treatment/procedure(s) were performed by non-physician practitioner and as supervising provider I was immediately available for consultation/collaboration.  I agree with above. Marrian Salvage, FNP

## 2019-11-05 ENCOUNTER — Ambulatory Visit (INDEPENDENT_AMBULATORY_CARE_PROVIDER_SITE_OTHER): Payer: Medicare HMO | Admitting: *Deleted

## 2019-11-05 DIAGNOSIS — Z0000 Encounter for general adult medical examination without abnormal findings: Secondary | ICD-10-CM

## 2019-11-08 DIAGNOSIS — Z01419 Encounter for gynecological examination (general) (routine) without abnormal findings: Secondary | ICD-10-CM | POA: Diagnosis not present

## 2019-11-10 ENCOUNTER — Other Ambulatory Visit: Payer: Self-pay | Admitting: Obstetrics and Gynecology

## 2019-11-10 DIAGNOSIS — E2839 Other primary ovarian failure: Secondary | ICD-10-CM

## 2020-01-14 ENCOUNTER — Ambulatory Visit: Payer: Medicare HMO | Attending: Internal Medicine

## 2020-01-14 DIAGNOSIS — Z23 Encounter for immunization: Secondary | ICD-10-CM

## 2020-01-14 NOTE — Progress Notes (Signed)
   Covid-19 Vaccination Clinic  Name:  Kim Anderson    MRN: [id number] DOB: 1951-02-18  01/14/2020  Kim Anderson was observed post Covid-19 immunization for 15 minutes without incidence. She was provided with Vaccine Information Sheet and instruction to access the V-Safe system.   Kim Anderson was instructed to call 911 with any severe reactions post vaccine: Marland Kitchen Difficulty breathing  . Swelling of your face and throat  . A fast heartbeat  . A bad rash all over your body  . Dizziness and weakness    Immunizations Administered    Name Date Dose VIS Date Route   Pfizer COVID-19 Vaccine 01/14/2020  4:06 PM 0.3 mL 11/12/2019 Intramuscular   Manufacturer: [employer], [employer]   Lot: [id number]   NDC: [address]-3

## 2020-01-24 ENCOUNTER — Other Ambulatory Visit: Payer: Self-pay

## 2020-01-24 MED ORDER — METOPROLOL SUCCINATE ER 25 MG PO TB24: 25.0000 mg | ORAL_TABLET | Freq: Every day | ORAL | 3 refills | Status: DC

## 2020-01-24 MED FILL — METOPROLOL SUCCINATE ER 25: 25 | 90 days supply | Qty: 90 | Fill #0

## 2020-01-25 DIAGNOSIS — R69 Illness, unspecified: Secondary | ICD-10-CM | POA: Diagnosis not present

## 2020-02-06 ENCOUNTER — Ambulatory Visit: Payer: Medicare HMO | Attending: Internal Medicine

## 2020-02-06 DIAGNOSIS — Z23 Encounter for immunization: Secondary | ICD-10-CM

## 2020-02-06 NOTE — Progress Notes (Signed)
   Covid-19 Vaccination Clinic  Name:  Kim Anderson    MRN: [id number] DOB: November 27, 1951  02/06/2020  [name] was observed post Covid-19 immunization for 15 minutes without incident. She was provided with Vaccine Information Sheet and instruction to access the V-Safe system.   [name] was instructed to call 911 with any severe reactions post vaccine: Marland Kitchen Difficulty breathing  . Swelling of face and throat  . A fast heartbeat  . A bad rash all over body  . Dizziness and weakness   Immunizations Administered    Name Date Dose VIS Date Route   Pfizer COVID-19 Vaccine 02/06/2020 12:28 PM 0.3 mL 11/12/2019 Intramuscular   Manufacturer: [employer], [employer]   Lot: [id number]   NDC: [address]-6

## 2020-02-07 ENCOUNTER — Ambulatory Visit
Admission: RE | Admit: 2020-02-07 | Discharge: 2020-02-07 | Disposition: A | Discharge status: Home / Self Care | Payer: Medicare HMO | Source: Ambulatory Visit | Attending: Obstetrics and Gynecology | Admitting: Obstetrics and Gynecology

## 2020-02-07 ENCOUNTER — Other Ambulatory Visit: Payer: Self-pay

## 2020-02-07 ENCOUNTER — Other Ambulatory Visit: Payer: Self-pay | Admitting: Obstetrics and Gynecology

## 2020-02-07 DIAGNOSIS — M8589 Other specified disorders of bone density and structure, multiple sites: Secondary | ICD-10-CM | POA: Diagnosis not present

## 2020-02-07 DIAGNOSIS — E2839 Other primary ovarian failure: Secondary | ICD-10-CM

## 2020-02-07 DIAGNOSIS — Z780 Asymptomatic menopausal state: Secondary | ICD-10-CM | POA: Diagnosis not present

## 2020-02-07 DIAGNOSIS — Z1231 Encounter for screening mammogram for malignant neoplasm of breast: Secondary | ICD-10-CM

## 2020-02-09 ENCOUNTER — Other Ambulatory Visit: Payer: Self-pay

## 2020-02-09 ENCOUNTER — Ambulatory Visit
Admission: RE | Admit: 2020-02-09 | Discharge: 2020-02-09 | Disposition: A | Discharge status: Home / Self Care | Payer: Medicare HMO | Source: Ambulatory Visit | Attending: Obstetrics and Gynecology | Admitting: Obstetrics and Gynecology

## 2020-02-09 DIAGNOSIS — Z1231 Encounter for screening mammogram for malignant neoplasm of breast: Secondary | ICD-10-CM

## 2020-02-17 DIAGNOSIS — R69 Illness, unspecified: Secondary | ICD-10-CM | POA: Diagnosis not present

## 2020-03-27 ENCOUNTER — Encounter: Payer: Self-pay | Admitting: Internal Medicine

## 2020-04-17 ENCOUNTER — Other Ambulatory Visit: Payer: Self-pay | Admitting: Internal Medicine

## 2020-04-24 ENCOUNTER — Ambulatory Visit (INDEPENDENT_AMBULATORY_CARE_PROVIDER_SITE_OTHER): Payer: Medicare HMO | Admitting: Internal Medicine

## 2020-04-24 ENCOUNTER — Other Ambulatory Visit: Payer: Self-pay

## 2020-04-24 ENCOUNTER — Encounter: Payer: Self-pay | Admitting: Internal Medicine

## 2020-04-24 VITALS — BP 140/82 | HR 62 | Temp 98.2°F | Ht 66.0 in | Wt 184.4 lb

## 2020-04-24 DIAGNOSIS — R002 Palpitations: Secondary | ICD-10-CM | POA: Diagnosis not present

## 2020-04-24 DIAGNOSIS — Z0000 Encounter for general adult medical examination without abnormal findings: Secondary | ICD-10-CM

## 2020-04-24 DIAGNOSIS — R69 Illness, unspecified: Secondary | ICD-10-CM | POA: Diagnosis not present

## 2020-04-24 LAB — LIPID PANEL
Cholesterol: 205 mg/dL — ABNORMAL HIGH (ref 0–200)
HDL: 72.8 mg/dL (ref >39.00)
LDL Cholesterol: 117 mg/dL — ABNORMAL HIGH (ref 0–99)
NonHDL: 132.21
Total CHOL/HDL Ratio: 3
Triglycerides: 76 mg/dL (ref 0.0–149.0)
VLDL: 15.2 mg/dL (ref 0.0–40.0)

## 2020-04-24 LAB — CBC
HCT: 42.2 % (ref 36.0–46.0)
Hemoglobin: 14.5 g/dL (ref 12.0–15.0)
MCHC: 34.5 g/dL (ref 30.0–36.0)
MCV: 92.1 fl (ref 78.0–100.0)
Platelets: 220 10*3/uL (ref 150.0–400.0)
RBC: 4.58 Mil/uL (ref 3.87–5.11)
RDW: 13.1 % (ref 11.5–15.5)
WBC: 5.2 10*3/uL (ref 4.0–10.5)

## 2020-04-24 LAB — COMPREHENSIVE METABOLIC PANEL
ALT: 13 U/L (ref 0–35)
AST: 18 U/L (ref 0–37)
Albumin: 4.4 g/dL (ref 3.5–5.2)
Alkaline Phosphatase: 57 U/L (ref 39–117)
BUN: 16 mg/dL (ref 6–23)
CO2: 25 mEq/L (ref 19–32)
Calcium: 9.2 mg/dL (ref 8.4–10.5)
Chloride: 105 mEq/L (ref 96–112)
Creatinine, Ser: 0.87 mg/dL (ref 0.40–1.20)
GFR: 64.94 mL/min (ref >60.00)
Glucose, Bld: 107 mg/dL — ABNORMAL HIGH (ref 70–99)
Potassium: 4.5 mEq/L (ref 3.5–5.1)
Sodium: 138 mEq/L (ref 135–145)
Total Bilirubin: 1 mg/dL (ref 0.2–1.2)
Total Protein: 7 g/dL (ref 6.0–8.3)

## 2020-04-24 NOTE — Assessment & Plan Note (Signed)
Flu shot yearly. Covid-19 complete. Pneumonia counseled and wants to wait since recent covid-19 vaccine. Shingrix counseled. Tetanus due 2029. Cologaurd due 2022. Mammogram due 2023, pap smear aged out and dexa due 2023. Counseled about sun safety and mole surveillance. Counseled about the dangers of distracted driving. Given 10 year screening recommendations.

## 2020-04-24 NOTE — Assessment & Plan Note (Signed)
Taking metoprolol daily and rare palpitations. Refill today.

## 2020-04-24 NOTE — Patient Instructions (Addendum)
Neck Exercises Ask your health care provider which exercises are safe for you. Do exercises exactly as told by your health care provider and adjust them as directed. It is normal to feel mild stretching, pulling, tightness, or discomfort as you do these exercises. Stop right away if you feel sudden pain or your pain gets worse. Do not begin these exercises until told by your health care provider. Neck exercises can be important for many reasons. They can improve strength and maintain flexibility in your neck, which will help your upper back and prevent neck pain. Stretching exercises Rotation neck stretching  1. Sit in a chair or stand up. 2. Place your feet flat on the floor, shoulder width apart. 3. Slowly turn your head (rotate) to the right until a slight stretch is felt. Turn it all the way to the right so you can look over your right shoulder. Do not tilt or tip your head. 4. Hold this position for 10-30 seconds. 5. Slowly turn your head (rotate) to the left until a slight stretch is felt. Turn it all the way to the left so you can look over your left shoulder. Do not tilt or tip your head. 6. Hold this position for 10-30 seconds. Repeat __________ times. Complete this exercise __________ times a day. Neck retraction 1. Sit in a sturdy chair or stand up. 2. Look straight ahead. Do not bend your neck. 3. Use your fingers to push your chin backward (retraction). Do not bend your neck for this movement. Continue to face straight ahead. If you are doing the exercise properly, you will feel a slight sensation in your throat and a stretch at the back of your neck. 4. Hold the stretch for 1-2 seconds. Repeat __________ times. Complete this exercise __________ times a day. Strengthening exercises Neck press 1. Lie on your back on a firm bed or on the floor with a pillow under your head. 2. Use your neck muscles to push your head down on the pillow and straighten your spine. 3. Hold the position  as well as you can. Keep your head facing up (in a neutral position) and your chin tucked. 4. Slowly count to 5 while holding this position. Repeat __________ times. Complete this exercise __________ times a day. Isometrics These are exercises in which you strengthen the muscles in your neck while keeping your neck still (isometrics). 1. Sit in a supportive chair and place your hand on your forehead. 2. Keep your head and face facing straight ahead. Do not flex or extend your neck while doing isometrics. 3. Push forward with your head and neck while pushing back with your hand. Hold for 10 seconds. 4. Do the sequence again, this time putting your hand against the back of your head. Use your head and neck to push backward against the hand pressure. 5. Finally, do the same exercise on either side of your head, pushing sideways against the pressure of your hand. Repeat __________ times. Complete this exercise __________ times a day. Prone head lifts 1. Lie face-down (prone position), resting on your elbows so that your chest and upper back are raised. 2. Start with your head facing downward, near your chest. Position your chin either on or near your chest. 3. Slowly lift your head upward. Lift until you are looking straight ahead. Then continue lifting your head as far back as you can comfortably stretch. 4. Hold your head up for 5 seconds. Then slowly lower it to your starting position. Repeat __________   times. Complete this exercise __________ times a day. Supine head lifts 1. Lie on your back (supine position), bending your knees to point to the ceiling and keeping your feet flat on the floor. 2. Lift your head slowly off the floor, raising your chin toward your chest. 3. Hold for 5 seconds. Repeat __________ times. Complete this exercise __________ times a day. Scapular retraction 1. Stand with your arms at your sides. Look straight ahead. 2. Slowly pull both shoulders (scapulae) backward  and downward (retraction) until you feel a stretch between your shoulder blades in your upper back. 3. Hold for 10-30 seconds. 4. Relax and repeat. Repeat __________ times. Complete this exercise __________ times a day. Contact a health care provider if:  Your neck pain or discomfort gets much worse when you do an exercise.  Your neck pain or discomfort does not improve within 2 hours after you exercise. If you have any of these problems, stop exercising right away. Do not do the exercises again unless your health care provider says that you can. Get help right away if:  You develop sudden, severe neck pain. If this happens, stop exercising right away. Do not do the exercises again unless your health care provider says that you can. This information is not intended to replace advice given to you by your health care provider. Make sure you discuss any questions you have with your health care provider. Document Revised: 09/16/2018 Document Reviewed: 09/16/2018 Elsevier Patient Education  2020 [employer]   Health Maintenance, Female Adopting a healthy lifestyle and getting preventive care are important in promoting health and wellness. Ask your health care provider about:  The right schedule for you to have regular tests and exams.  Things you can do on your own to prevent diseases and keep yourself healthy. What should I know about diet, weight, and exercise? Eat a healthy diet   Eat a diet that includes plenty of vegetables, fruits, low-fat dairy products, and lean protein.  Do not eat a lot of foods that are high in solid fats, added sugars, or sodium. Maintain a healthy weight Body mass index (BMI) is used to identify weight problems. It estimates body fat based on height and weight. Your health care provider can help determine your BMI and help you achieve or maintain a healthy weight. Get regular exercise Get regular exercise. This is one of the most important things you can  do for your health. Most adults should:  Exercise for at least 150 minutes each week. The exercise should increase your heart rate and make you sweat (moderate-intensity exercise).  Do strengthening exercises at least twice a week. This is in addition to the moderate-intensity exercise.  Spend less time sitting. Even light physical activity can be beneficial. Watch cholesterol and blood lipids Have your blood tested for lipids and cholesterol at 67 years of age, then have this test every 5 years. Have your cholesterol levels checked more often if:  Your lipid or cholesterol levels are high.  You are older than 67 years of age.  You are at high risk for heart disease. What should I know about cancer screening? Depending on your health history and family history, you may need to have cancer screening at various ages. This may include screening for:  Breast cancer.  Cervical cancer.  Colorectal cancer.  Skin cancer.  Lung cancer. What should I know about heart disease, diabetes, and high blood pressure? Blood pressure and heart disease  High blood pressure causes heart   disease and increases the risk of stroke. This is more likely to develop in people who have high blood pressure readings, are of African descent, or are overweight.  Have your blood pressure checked: ? Every 3-5 years if you are 18-39 years of age. ? Every year if you are 40 years old or older. Diabetes Have regular diabetes screenings. This checks your fasting blood sugar level. Have the screening done:  Once every three years after age 40 if you are at a normal weight and have a low risk for diabetes.  More often and at a younger age if you are overweight or have a high risk for diabetes. What should I know about preventing infection? Hepatitis B If you have a higher risk for hepatitis B, you should be screened for this virus. Talk with your health care provider to find out if you are at risk for hepatitis B  infection. Hepatitis C Testing is recommended for:  Everyone born from 1945 through 1965.  Anyone with known risk factors for hepatitis C. Sexually transmitted infections (STIs)  Get screened for STIs, including gonorrhea and chlamydia, if: ? You are sexually active and are younger than 67 years of age. ? You are older than 67 years of age and your health care provider tells you that you are at risk for this type of infection. ? Your sexual activity has changed since you were last screened, and you are at increased risk for chlamydia or gonorrhea. Ask your health care provider if you are at risk.  Ask your health care provider about whether you are at high risk for HIV. Your health care provider may recommend a prescription medicine to help prevent HIV infection. If you choose to take medicine to prevent HIV, you should first get tested for HIV. You should then be tested every 3 months for as long as you are taking the medicine. Pregnancy  If you are about to stop having your period (premenopausal) and you may become pregnant, seek counseling before you get pregnant.  Take 400 to 800 micrograms (mcg) of folic acid every day if you become pregnant.  Ask for birth control (contraception) if you want to prevent pregnancy. Osteoporosis and menopause Osteoporosis is a disease in which the bones lose minerals and strength with aging. This can result in bone fractures. If you are 65 years old or older, or if you are at risk for osteoporosis and fractures, ask your health care provider if you should:  Be screened for bone loss.  Take a calcium or vitamin D supplement to lower your risk of fractures.  Be given hormone replacement therapy (HRT) to treat symptoms of menopause. Follow these instructions at home: Lifestyle  Do not use any products that contain nicotine or tobacco, such as cigarettes, e-cigarettes, and chewing tobacco. If you need help quitting, ask your health care  provider.  Do not use street drugs.  Do not share needles.  Ask your health care provider for help if you need support or information about quitting drugs. Alcohol use  Do not drink alcohol if: ? Your health care provider tells you not to drink. ? You are pregnant, may be pregnant, or are planning to become pregnant.  If you drink alcohol: ? Limit how much you use to 0-1 drink a day. ? Limit intake if you are breastfeeding.  Be aware of how much alcohol is in your drink. In the [place] one drink equals one 12 oz bottle of beer (355 mL), one   5 oz glass of wine (148 mL), or one 1 oz glass of hard liquor (44 mL). General instructions  Schedule regular health, dental, and eye exams.  Stay current with your vaccines.  Tell your health care provider if: ? You often feel depressed. ? You have ever been abused or do not feel safe at home. Summary  Adopting a healthy lifestyle and getting preventive care are important in promoting health and wellness.  Follow your health care provider's instructions about healthy diet, exercising, and getting tested or screened for diseases.  Follow your health care provider's instructions on monitoring your cholesterol and blood pressure. This information is not intended to replace advice given to you by your health care provider. Make sure you discuss any questions you have with your health care provider. Document Revised: 11/11/2018 Document Reviewed: 11/11/2018 Elsevier Patient Education  2020 Elsevier Inc.  

## 2020-04-24 NOTE — Progress Notes (Signed)
   Subjective:   Patient ID: Kim Anderson, female    DOB: Feb 15, 1951, 68 y.o.   MRN: [id number]  HPI The patient is a 69 YO female coming in for physical.   PMH, FMH, social history reviewed and updated  Review of Systems  Constitutional: Negative.   HENT: Negative.   Eyes: Negative.   Respiratory: Negative for cough, chest tightness and shortness of breath.   Cardiovascular: Negative for chest pain, palpitations and leg swelling.  Gastrointestinal: Negative for abdominal distention, abdominal pain, constipation, diarrhea, nausea and vomiting.  Musculoskeletal: Negative.   Skin: Negative.   Neurological: Negative.   Psychiatric/Behavioral: Negative.     Objective:  Physical Exam Constitutional:      Appearance: She is well-developed.  HENT:     Head: Normocephalic and atraumatic.  Cardiovascular:     Rate and Rhythm: Normal rate and regular rhythm.  Pulmonary:     Effort: Pulmonary effort is normal. No respiratory distress.     Breath sounds: Normal breath sounds. No wheezing or rales.  Abdominal:     General: Bowel sounds are normal. There is no distension.     Palpations: Abdomen is soft.     Tenderness: There is no abdominal tenderness. There is no rebound.  Musculoskeletal:     Cervical back: Normal range of motion.  Skin:    General: Skin is warm and dry.  Neurological:     Mental Status: She is alert and oriented to person, place, and time.     Coordination: Coordination normal.     Vitals:   04/24/20 0903  BP: 140/82  Pulse: 62  Temp: 98.2 F (36.8 C)  SpO2: 99%  Weight: 184 lb 6.4 oz (83.6 kg)  Height: 5\' 6"  (1.676 m)    This visit occurred during the SARS-CoV-2 public health emergency.  Safety protocols were in place, including screening questions prior to the visit, additional usage of staff PPE, and extensive cleaning of exam room while observing appropriate contact time as indicated for disinfecting solutions.   Assessment & Plan:

## 2020-05-09 ENCOUNTER — Other Ambulatory Visit: Payer: Self-pay | Admitting: Internal Medicine

## 2020-07-25 DIAGNOSIS — Z809 Family history of malignant neoplasm, unspecified: Secondary | ICD-10-CM | POA: Diagnosis not present

## 2020-07-25 DIAGNOSIS — Z882 Allergy status to sulfonamides status: Secondary | ICD-10-CM | POA: Diagnosis not present

## 2020-07-25 DIAGNOSIS — Z7982 Long term (current) use of aspirin: Secondary | ICD-10-CM | POA: Diagnosis not present

## 2020-07-25 DIAGNOSIS — Z880 Allergy status to penicillin: Secondary | ICD-10-CM | POA: Diagnosis not present

## 2020-07-25 DIAGNOSIS — R030 Elevated blood-pressure reading, without diagnosis of hypertension: Secondary | ICD-10-CM | POA: Diagnosis not present

## 2020-07-25 DIAGNOSIS — R0789 Other chest pain: Secondary | ICD-10-CM | POA: Diagnosis not present

## 2020-07-25 DIAGNOSIS — Z008 Encounter for other general examination: Secondary | ICD-10-CM | POA: Diagnosis not present

## 2020-07-25 DIAGNOSIS — Z823 Family history of stroke: Secondary | ICD-10-CM | POA: Diagnosis not present

## 2020-07-25 DIAGNOSIS — Z833 Family history of diabetes mellitus: Secondary | ICD-10-CM | POA: Diagnosis not present

## 2020-07-26 DIAGNOSIS — R69 Illness, unspecified: Secondary | ICD-10-CM | POA: Diagnosis not present

## 2020-07-31 DIAGNOSIS — H52223 Regular astigmatism, bilateral: Secondary | ICD-10-CM | POA: Diagnosis not present

## 2020-07-31 DIAGNOSIS — H524 Presbyopia: Secondary | ICD-10-CM | POA: Diagnosis not present

## 2020-09-05 DIAGNOSIS — R69 Illness, unspecified: Secondary | ICD-10-CM | POA: Diagnosis not present

## 2020-11-01 DIAGNOSIS — R69 Illness, unspecified: Secondary | ICD-10-CM | POA: Diagnosis not present

## 2020-11-20 ENCOUNTER — Telehealth (INDEPENDENT_AMBULATORY_CARE_PROVIDER_SITE_OTHER): Payer: Medicare HMO

## 2020-11-20 DIAGNOSIS — Z0000 Encounter for general adult medical examination without abnormal findings: Secondary | ICD-10-CM

## 2020-11-20 NOTE — Patient Instructions (Signed)
Kim Anderson , Thank you for taking time to come for your Medicare Wellness Visit. I appreciate your ongoing commitment to your health goals. Please review the following plan we discussed and let me know if I can assist you in the future.   Screening recommendations/referrals: Colonoscopy: 03/09/2018; due every 3 years (Cologuard) Mammogram: 02/09/2020 Bone Density: 02/07/2020 Recommended yearly ophthalmology/optometry visit for glaucoma screening and checkup Recommended yearly dental visit for hygiene and checkup  Vaccinations: Influenza vaccine: 09/2020 Pneumococcal vaccine: need Pneumovax 23 Tdap vaccine: 11/05/2018; due every 10 years Shingles vaccine: never done   Covid-19: up to date  Advanced directives: Please bring a copy of your health care power of attorney and living will to the office at your convenience.  Conditions/risks identified: Yes; Reviewed health maintenance screenings with patient today and relevant education, vaccines, and/or referrals were provided. Please continue to do your personal lifestyle choices by: daily care of teeth and gums, regular physical activity (goal should be 5 days a week for 30 minutes), eat a healthy diet, avoid tobacco and drug use, limiting any alcohol intake, taking a low-dose aspirin (if not allergic or have been advised by your provider otherwise) and taking vitamins and minerals as recommended by your provider. Continue doing brain stimulating activities (puzzles, reading, adult coloring books, staying active) to keep memory sharp. Continue to eat heart healthy diet (full of fruits, vegetables, whole grains, lean protein, water--limit salt, fat, and sugar intake) and increase physical activity as tolerated.  Next appointment: Please schedule your next Medicare Wellness Visit with your Nurse Health Advisor in 1 year by calling [phone].  Preventive Care 67 Years and Older, Female Preventive care refers to lifestyle choices and visits with your  health care provider that can promote health and wellness. What does preventive care include?  A yearly physical exam. This is also called an annual well check.  Dental exams once or twice a year.  Routine eye exams. Ask your health care provider how often you should have your eyes checked.  Personal lifestyle choices, including:  Daily care of your teeth and gums.  Regular physical activity.  Eating a healthy diet.  Avoiding tobacco and drug use.  Limiting alcohol use.  Practicing safe sex.  Taking low-dose aspirin every day.  Taking vitamin and mineral supplements as recommended by your health care provider. What happens during an annual well check? The services and screenings done by your health care provider during your annual well check will depend on your age, overall health, lifestyle risk factors, and family history of disease. Counseling  Your health care provider may ask you questions about your:  Alcohol use.  Tobacco use.  Drug use.  Emotional well-being.  Home and relationship well-being.  Sexual activity.  Eating habits.  History of falls.  Memory and ability to understand (cognition).  Work and work Astronomer.  Reproductive health. Screening  You may have the following tests or measurements:  Height, weight, and BMI.  Blood pressure.  Lipid and cholesterol levels. These may be checked every 5 years, or more frequently if you are over 79 years old.  Skin check.  Lung cancer screening. You may have this screening every year starting at age 4 if you have a 30-pack-year history of smoking and currently smoke or have quit within the past 15 years.  Fecal occult blood test (FOBT) of the stool. You may have this test every year starting at age 56.  Flexible sigmoidoscopy or colonoscopy. You may have a sigmoidoscopy every 5  years or a colonoscopy every 10 years starting at age 48.  Hepatitis C blood test.  Hepatitis B blood  test.  Sexually transmitted disease (STD) testing.  Diabetes screening. This is done by checking your blood sugar (glucose) after you have not eaten for a while (fasting). You may have this done every 1-3 years.  Bone density scan. This is done to screen for osteoporosis. You may have this done starting at age 75.  Mammogram. This may be done every 1-2 years. Talk to your health care provider about how often you should have regular mammograms. Talk with your health care provider about your test results, treatment options, and if necessary, the need for more tests. Vaccines  Your health care provider may recommend certain vaccines, such as:  Influenza vaccine. This is recommended every year.  Tetanus, diphtheria, and acellular pertussis (Tdap, Td) vaccine. You may need a Td booster every 10 years.  Zoster vaccine. You may need this after age 3.  Pneumococcal 13-valent conjugate (PCV13) vaccine. One dose is recommended after age 59.  Pneumococcal polysaccharide (PPSV23) vaccine. One dose is recommended after age 40. Talk to your health care provider about which screenings and vaccines you need and how often you need them. This information is not intended to replace advice given to you by your health care provider. Make sure you discuss any questions you have with your health care provider. Document Released: 12/15/2015 Document Revised: 08/07/2016 Document Reviewed: 09/19/2015 Elsevier Interactive Patient Education  2017 [employer] Prevention in the Home Falls can cause injuries. They can happen to people of all ages. There are many things you can do to make your home safe and to help prevent falls. What can I do on the outside of my home?  Regularly fix the edges of walkways and driveways and fix any cracks.  Remove anything that might make you trip as you walk through a door, such as a raised step or threshold.  Trim any bushes or trees on the path to your home.  Use  bright outdoor lighting.  Clear any walking paths of anything that might make someone trip, such as rocks or tools.  Regularly check to see if handrails are loose or broken. Make sure that both sides of any steps have handrails.  Any raised decks and porches should have guardrails on the edges.  Have any leaves, snow, or ice cleared regularly.  Use sand or salt on walking paths during winter.  Clean up any spills in your garage right away. This includes oil or grease spills. What can I do in the bathroom?  Use night lights.  Install grab bars by the toilet and in the tub and shower. Do not use towel bars as grab bars.  Use non-skid mats or decals in the tub or shower.  If you need to sit down in the shower, use a plastic, non-slip stool.  Keep the floor dry. Clean up any water that spills on the floor as soon as it happens.  Remove soap buildup in the tub or shower regularly.  Attach bath mats securely with double-sided non-slip rug tape.  Do not have throw rugs and other things on the floor that can make you trip. What can I do in the bedroom?  Use night lights.  Make sure that you have a light by your bed that is easy to reach.  Do not use any sheets or blankets that are too big for your bed. They should not hang  down onto the floor.  Have a firm chair that has side arms. You can use this for support while you get dressed.  Do not have throw rugs and other things on the floor that can make you trip. What can I do in the kitchen?  Clean up any spills right away.  Avoid walking on wet floors.  Keep items that you use a lot in easy-to-reach places.  If you need to reach something above you, use a strong step stool that has a grab bar.  Keep electrical cords out of the way.  Do not use floor polish or wax that makes floors slippery. If you must use wax, use non-skid floor wax.  Do not have throw rugs and other things on the floor that can make you trip. What can  I do with my stairs?  Do not leave any items on the stairs.  Make sure that there are handrails on both sides of the stairs and use them. Fix handrails that are broken or loose. Make sure that handrails are as long as the stairways.  Check any carpeting to make sure that it is firmly attached to the stairs. Fix any carpet that is loose or worn.  Avoid having throw rugs at the top or bottom of the stairs. If you do have throw rugs, attach them to the floor with carpet tape.  Make sure that you have a light switch at the top of the stairs and the bottom of the stairs. If you do not have them, ask someone to add them for you. What else can I do to help prevent falls?  Wear shoes that:  Do not have high heels.  Have rubber bottoms.  Are comfortable and fit you well.  Are closed at the toe. Do not wear sandals.  If you use a stepladder:  Make sure that it is fully opened. Do not climb a closed stepladder.  Make sure that both sides of the stepladder are locked into place.  Ask someone to hold it for you, if possible.  Clearly mark and make sure that you can see:  Any grab bars or handrails.  First and last steps.  Where the edge of each step is.  Use tools that help you move around (mobility aids) if they are needed. These include:  Canes.  Walkers.  Scooters.  Crutches.  Turn on the lights when you go into a dark area. Replace any light bulbs as soon as they burn out.  Set up your furniture so you have a clear path. Avoid moving your furniture around.  If any of your floors are uneven, fix them.  If there are any pets around you, be aware of where they are.  Review your medicines with your doctor. Some medicines can make you feel dizzy. This can increase your chance of falling. Ask your doctor what other things that you can do to help prevent falls. This information is not intended to replace advice given to you by your health care provider. Make sure you  discuss any questions you have with your health care provider. Document Released: 09/14/2009 Document Revised: 04/25/2016 Document Reviewed: 12/23/2014 Elsevier Interactive Patient Education  2017 [employer].

## 2020-11-20 NOTE — Progress Notes (Signed)
I connected with Particia Strahm today by telephone and verified that I am speaking with the correct person using two identifiers. Location patient: home Location provider: work Persons participating in the virtual visit: Verina Galeno and Lisette Abu, LPN.   I discussed the limitations, risks, security and privacy concerns of performing an evaluation and management service by telephone and the availability of in person appointments. I also discussed with the patient that there may be a patient responsible charge related to this service. The patient expressed understanding and verbally consented to this telephonic visit.    Interactive audio and video telecommunications were attempted between this provider and patient, however failed, due to patient having technical difficulties OR patient did not have access to video capability.  We continued and completed visit with audio only.  Some vital signs may be absent or patient reported.   Time Spent with patient on telephone encounter: 30 minutes  Subjective:   ROSLAND Anderson is a 69 y.o. female who presents for Medicare Annual (Subsequent) preventive examination.  Review of Systems    No ROS. Medicare Wellness Visit. Additional risk factors are reflected in social history. Cardiac Risk Factors include: advanced age (>72mn, >>58women);hypertension;family history of premature cardiovascular disease Sleep Patterns: No sleep issues, feels rested on waking and sleeps 8 hours nightly. Home Safety/Smoke Alarms: Feels safe in home; uses home alarm. Smoke alarms in place. Living environment: 1-story home; Lives with spouse; no needs for DME; good support system. Seat Belt Safety/Bike Helmet: Wears seat belt.    Objective:    There were no vitals filed for this visit. There is no height or weight on file to calculate BMI.  Advanced Directives 11/20/2020 11/05/2019 07/31/2016 06/14/2016 04/13/2015  Does Patient Have a Medical Advance Directive? Yes  Yes Yes No Yes  Type of [place] - - -  Does patient want to make changes to medical advance directive? No - Patient declined - - - -  Copy of HFort Mohavein Chart? No - copy requested No - copy requested No - copy requested - -    Current Medications (verified) Outpatient Encounter Medications as of 11/20/2020  Medication Sig  . aspirin (ASPIR-LOW) 81 MG EC tablet Take 1 tablet (81 mg total) by mouth daily. Swallow whole.  . B Complex Vitamins (VITAMIN B COMPLEX) TABS TAKE 1 TABLET BY MOUTH DAILY.  . Calcium Carbonate-Vitamin D 600-400 MG-UNIT tablet TAKE 1 TABLET BY MOUTH ONCE DAILY  . metoprolol succinate (TOPROL-XL) 25 MG 24 hr tablet Take 1 tablet (25 mg total) by mouth daily.  . Omega-3 Fatty Acids (FISH OIL) 1000 MG CAPS TAKE 1 CAPSULE BY MOUTH DAILY.   No facility-administered encounter medications on file as of 11/20/2020.    Allergies (verified) Amoxicillin, Ampicillin, Cephalexin, Meperidine hcl, Ofloxacin, and Sulfamethoxazole-trimethoprim   History: Past Medical History:  Diagnosis Date  . History of chest pain   . Mitral valve prolapse    responded to beta-blockers. no evidence of this on 2-d echo/ happened long ago, has not been an issue for years   . Palpitations    Past Surgical History:  Procedure Laterality Date  . arm fracture Left 2017  . CHOLECYSTECTOMY  1997  . COLONOSCOPY  11/02/2007   normal  . LEFT SHOULDER ACROMINOPLASTY Left    cleaned it up   Family History  Problem Relation Age of Onset  . Diabetes Other  parent  . Hypertension Other        parent  . Hemochromatosis Brother   . Diabetes Mother   . Cancer Mother   . Multiple myeloma Mother    Social History   Socioeconomic History  . Marital status: Married    Spouse name: Not on file  . Number of children: 4  . Years of education: Not on file  . Highest education level:  Not on file  Occupational History  . 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 Smoking status: Never Smoker  . Smokeless tobacco: Never Used  Vaping Use  . Vaping Use: Never used  Substance and Sexual Activity  . Alcohol use: No  . Drug use: No  . Sexual activity: Not Currently  Other Topics Concern  . Not on file  Social History Narrative   Lives with spouse   Social Determinants of Health   Financial Resource Strain: Low Risk   . Difficulty of Paying Living Expenses: Not hard at all  Food Insecurity: No Food Insecurity  . Worried About Charity fundraiser in the Last Year: Never true  . Ran Out of Food in the Last Year: Never true  Transportation Needs: No Transportation Needs  . Lack of Transportation (Medical): No  . Lack of Transportation (Non-Medical): No  Physical Activity: Sufficiently Active  . Days of Exercise per Week: 5 days  . Minutes of Exercise per Session: 30 min  Stress: No Stress Concern Present  . Feeling of Stress : Not at all  Social Connections: Socially Integrated  . Frequency of Communication with Friends and Family: More than three times a week  . Frequency of Social Gatherings with Friends and Family: More than three times a week  . Attends Religious Services: More than 4 times per year  . Active Member of Clubs or Organizations: Yes  . Attends Archivist Meetings: More than 4 times per year  . Marital Status: Married    Tobacco Counseling Counseling given: Not Answered   Clinical Intake:  Pre-visit preparation completed: Yes  Pain : No/denies pain     Nutritional Risks: None Diabetes: No  How often do you need to have someone help you when you read instructions, pamphlets, or other written materials from your doctor or pharmacy?: 1 - Never What is the last grade level you completed in school?: Master's Degree in Nursing  Diabetic? no  Interpreter Needed?: No  Information  entered by :: Lisette Abu, LPN   Activities of Daily Living In your present state of health, do you have any difficulty performing the following activities: 11/20/2020  Hearing? N  Vision? N  Difficulty concentrating or making decisions? N  Walking or climbing stairs? N  Dressing or bathing? N  Doing errands, shopping? N  Preparing Food and eating ? N  Using the Toilet? N  In the past six months, have you accidently leaked urine? N  Do you have problems with loss of bowel control? N  Managing your Medications? N  Managing your Finances? N  Housekeeping or managing your Housekeeping? N  Some recent data might be hidden    Patient Care Team: Hoyt Koch, MD as PCP - General (Internal Medicine) Verl Blalock, Marijo Conception, MD (Inactive) (Cardiology) Erline Levine, MD (Neurosurgery) Brien Few, MD as Consulting Physician (Obstetrics and Gynecology)  Indicate any recent Medical Services you may have received from other than [facility] providers  in the past year (date may be approximate).     Assessment:   This is a routine wellness examination for Euretha.  Hearing/Vision screen No exam data present  Dietary issues and exercise activities discussed: Current Exercise Habits: Home exercise routine, Type of exercise: walking, Time (Minutes): 30, Frequency (Times/Week): 5, Weekly Exercise (Minutes/Week): 150, Intensity: Moderate, Exercise limited by: None identified  Goals    .  Patient Stated (pt-stated)      To maintain my current health status by continuing to eat healthy, stay physically active and socially active.      Depression Screen PHQ 2/9 Scores 11/20/2020 11/05/2019 11/05/2018 05/01/2017 04/13/2015 12/08/2013  PHQ - 2 Score 0 0 0 0 0 0  PHQ- 9 Score - - - 1 - -    Fall Risk Fall Risk  11/20/2020 11/05/2019 11/05/2018 04/13/2015 12/08/2013  Falls in the past year? 0 0 0 No No  Number falls in past yr: 0 0 - - -  Injury with Fall? 0 0 - - -  Risk for fall due to : No Fall  Risks - - - -  Follow up Falls evaluation completed - - - -    FALL RISK PREVENTION PERTAINING TO THE HOME:  Any stairs in or around the home? No  If so, are there any without handrails? No  Home free of loose throw rugs in walkways, pet beds, electrical cords, etc? Yes  Adequate lighting in your home to reduce risk of falls? Yes   ASSISTIVE DEVICES UTILIZED TO PREVENT FALLS:  Life alert? No  Use of a cane, walker or w/c? No  Grab bars in the bathroom? Yes  Shower chair or bench in shower? Yes  Elevated toilet seat or a handicapped toilet? Yes   TIMED UP AND GO:  Was the test performed? No .  Length of time to ambulate 10 feet: 0 sec.   Gait steady and fast without use of assistive device  Cognitive Function:    Patient is cogitatively intact.          Immunizations Immunization History  Administered Date(s) Administered  . Fluad Quad(high Dose 65+) 08/12/2019  . Influenza Whole 10/18/2010  . Influenza,inj,Quad PF,6+ Mos 09/01/2013  . Influenza-Unspecified 08/10/2014, 08/18/2017, 08/29/2018  . PFIZER SARS-COV-2 Vaccination 01/14/2020, 02/06/2020, 10/16/2020  . Pneumococcal Conjugate-13 11/05/2018  . Tdap 11/05/2018  . Zoster 04/10/2015    TDAP status: Up to date  Flu Vaccine status: Up to date  Pneumococcal vaccine status: Due, Education has been provided regarding the importance of this vaccine. Advised may receive this vaccine at local pharmacy or Health Dept. Aware to provide a copy of the vaccination record if obtained from local pharmacy or Health Dept. Verbalized acceptance and understanding.  Covid-19 vaccine status: Completed vaccines  Qualifies for Shingles Vaccine? Yes   Zostavax completed Yes   Shingrix Completed?: No.    Education has been provided regarding the importance of this vaccine. Patient has been advised to call insurance company to determine out of pocket expense if they have not yet received this vaccine. Advised may also receive vaccine  at local pharmacy or Health Dept. Verbalized acceptance and understanding.  Screening Tests Health Maintenance  Topic Date Due  . PNA vac Low Risk Adult (2 of 2 - PPSV23) 11/06/2019  . INFLUENZA VACCINE  07/02/2020  . Fecal DNA (Cologuard)  03/09/2021  . MAMMOGRAM  02/08/2022  . TETANUS/TDAP  11/05/2028  . DEXA SCAN  Completed  . COVID-19 Vaccine  Completed  .  Hepatitis C Screening  Completed    Health Maintenance  Health Maintenance Due  Topic Date Due  . PNA vac Low Risk Adult (2 of 2 - PPSV23) 11/06/2019  . INFLUENZA VACCINE  07/02/2020    Colorectal cancer screening: Type of screening: Cologuard. Completed 03/09/2018. Repeat every 3 years  Mammogram status: Completed 02/09/2020. Repeat every year  Bone Density status: Completed 02/07/2020. Results reflect: Bone density results: OSTEOPENIA. Repeat every 2 years.  Lung Cancer Screening: (Low Dose CT Chest recommended if Age 34-80 years, 30 pack-year currently smoking OR have quit w/in 15years.) does not qualify.   Lung Cancer Screening Referral: no  Additional Screening:  Hepatitis C Screening: does qualify; Completed yes  Vision Screening: Recommended annual ophthalmology exams for early detection of glaucoma and other disorders of the eye. Is the patient up to date with their annual eye exam?  Yes  Who is the provider or what is the name of the office in which the patient attends annual eye exams? [facility] If pt is not established with a provider, would they like to be referred to a provider to establish care? No .   Dental Screening: Recommended annual dental exams for proper oral hygiene  Community Resource Referral / Chronic Care Management: CRR required this visit?  No   CCM required this visit?  No      Plan:     I have personally reviewed and noted the following in the patient's chart:   . Medical and social history . Use of alcohol, tobacco or illicit drugs  . Current medications and  supplements . Functional ability and status . Nutritional status . Physical activity . Advanced directives . List of other physicians . Hospitalizations, surgeries, and ER visits in previous 12 months . Vitals . Screenings to include cognitive, depression, and falls . Referrals and appointments  In addition, I have reviewed and discussed with patient certain preventive protocols, quality metrics, and best practice recommendations. A written personalized care plan for preventive services as well as general preventive health recommendations were provided to patient.     Sheral Flow, LPN   95/[id number]/[id number]   Nurse Notes:  Patient is cogitatively intact. There were no vitals filed for this visit. There is no height or weight on file to calculate BMI. Patient stated that she has no issues with gait or balance; does not use any assistive devices.

## 2020-12-03 ENCOUNTER — Encounter: Payer: Self-pay | Admitting: Internal Medicine

## 2020-12-05 ENCOUNTER — Ambulatory Visit: Payer: Self-pay

## 2020-12-05 ENCOUNTER — Other Ambulatory Visit: Payer: Self-pay

## 2020-12-05 ENCOUNTER — Ambulatory Visit (INDEPENDENT_AMBULATORY_CARE_PROVIDER_SITE_OTHER): Payer: Medicare HMO | Admitting: Family Medicine

## 2020-12-05 ENCOUNTER — Encounter: Payer: Self-pay | Admitting: Family Medicine

## 2020-12-05 ENCOUNTER — Ambulatory Visit (INDEPENDENT_AMBULATORY_CARE_PROVIDER_SITE_OTHER): Payer: Medicare HMO

## 2020-12-05 VITALS — BP 144/82 | HR 64 | Ht 66.0 in | Wt 184.6 lb

## 2020-12-05 DIAGNOSIS — M79604 Pain in right leg: Secondary | ICD-10-CM

## 2020-12-05 DIAGNOSIS — M25551 Pain in right hip: Secondary | ICD-10-CM

## 2020-12-05 DIAGNOSIS — L84 Corns and callosities: Secondary | ICD-10-CM

## 2020-12-05 DIAGNOSIS — M545 Low back pain, unspecified: Secondary | ICD-10-CM | POA: Diagnosis not present

## 2020-12-05 MED ORDER — GABAPENTIN 100 MG PO CAPS: 100.0000 mg | ORAL_CAPSULE | Freq: Three times a day (TID) | ORAL | 2 refills | Status: DC | PRN

## 2020-12-05 MED ORDER — PREDNISONE 50 MG PO TABS: 50.0000 mg | ORAL_TABLET | Freq: Every day | ORAL | 0 refills | Status: DC

## 2020-12-05 NOTE — Progress Notes (Signed)
X-ray lumbar spine normal per radiology

## 2020-12-05 NOTE — Progress Notes (Signed)
Subjective:    CC: R posterior thigh pain  I, Kim Anderson, LAT, ATC, am serving as scribe for Dr. Clementeen Graham.  HPI: Pt is a 70 y/o female presenting w/ c/o R posterior thigh/hip pain since mid-November 2021 w/ no known MOI.  She locates her pain to her R buttocks/IT that runs down her R post-lat thigh and into her R lateral and ant hip.  Low back pain: yes L-sided LBP Radiating pain: yes into her R posterior-lateral thigh and sometimes past the knee R LE numbness/tingling: No Aggravating factors: walking; driving; prolonged sitting Treatments tried: NSAIDs   R 5th toe pain: Chronic, intermittent pain that has worsened over the last few weeks.  She reports a visual bump on her R 5th toe along the medial aspect.  She has tried a toe separator/pad.  Pertinent review of Systems: No fevers or chills  Relevant historical information: History of palpitations.   Objective:    Vitals:   12/05/20 1022  BP: (!) 144/82  Pulse: 64  SpO2: 96%   General: Well Developed, well nourished, and in no acute distress.   MSK: L-spine normal-appearing Nontender midline.  Nontender paraspinal musculature. Normal lumbar motion. Lower extremity strength reflexes and sensation are equal and normal. Mildly positive right-sided slump test.  Right hip normal-appearing Normal motion some pain with flexion and internal rotation. Mildly tender palpation greater trochanter. Not particularly tender at area of pain at ischial tuberosity and piriformis area. Intact strength abduction external rotation and internal rotation. Hip extension intact however slight painful. Resisted knee flexion strength intact however mildly painful  Right lower leg normal-appearing nontender normal knee and foot and ankle motion.  Left fifth toe small callus versus wart at the medial corner of the toe.  Minimally tender.  No surrounding erythema.      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  No anterior listhesis or severe malalignment.  Right hip: Right hip effectively normal-appearing no fractures or severe degenerative changes hip or pelvis.  Await formal radiology review   Impression and Recommendations:    Assessment and Plan: 70 y.o. female with right leg pain thought to be multifactorial.  Certainly could be lumbosacral radiculopathy or also piriformis syndrome associate with hamstring strain.  We will treat for both.  Will refer to physical therapy.  Work on home exercise program for hamstring strains.  Additionally will prescribe prednisone and gabapentin.  If not improving patient will notify me and check back in the near future.  Consider advanced imaging such as MRI lumbar spine if needed.  As for the fifth toe pain patient has what appears to be a corn or callus possibly even a wart.  She is already using a offloading toe pad which I think is a great idea.  Recommend toe pad with salicylic acid.  If not improving consider referral to podiatry.  PDMP not reviewed this encounter. Orders Placed This Encounter  Procedures  . DG Lumbar Spine 2-3 Views    Standing Status:   Future    Number of Occurrences:   1    Standing Expiration Date:   12/05/2021    Order Specific Question:   Reason for Exam (SYMPTOM  OR DIAGNOSIS REQUIRED)    Answer:   right leg pain    Order Specific Question:   Preferred imaging location?  Answer:   [facility]  . DG HIP UNILAT WITH PELVIS 2-3 VIEWS RIGHT    Standing Status:   Future    Number of Occurrences:   1    Standing Expiration Date:   12/05/2021    Order Specific Question:   Reason for Exam (SYMPTOM  OR DIAGNOSIS REQUIRED)    Answer:   eval right buttock pain    Order Specific Question:   Preferred imaging location?    Answer:   [facility]  . Ambulatory referral to  Physical Therapy    Referral Priority:   Routine    Referral Type:   Physical Medicine    Referral Reason:   Specialty Services Required    Requested Specialty:   Physical Therapy   Meds ordered this encounter  Medications  . predniSONE (DELTASONE) 50 MG tablet    Sig: Take 1 tablet (50 mg total) by mouth daily.    Dispense:  5 tablet    Refill:  0  . gabapentin (NEURONTIN) 100 MG capsule    Sig: Take 1-3 capsules (100-300 mg total) by mouth 3 (three) times daily as needed. Mostly at bedtime    Dispense:  90 capsule    Refill:  2    Discussed warning signs or symptoms. Please see discharge instructions. Patient expresses understanding.   The above documentation has been reviewed and is accurate and complete Clementeen Graham, M.D.

## 2020-12-05 NOTE — Progress Notes (Signed)
X-ray hip shows a tiny bit of arthritis in the hip joint otherwise looks normal to radiology.

## 2020-12-05 NOTE — Patient Instructions (Addendum)
Thank you for coming in today.  Please get an Xray today before you leave  I've referred you to Physical Therapy.  Let us know if you don't hear from them in one week.  Take the gabapentin at bedtime as needed for nerve pain.   Take the short course of prednisone.  OK to reduce the dose or stop early if needed.  Recheck in about 6 weeks especially if not better.   Let me know sooner if this is not working.    The [place] L Protocol for Hamstring Strains  Leeroy Cha PT   Piriformis Syndrome  Piriformis syndrome is a condition that can cause pain and numbness in your buttocks and down the back of your leg. Piriformis syndrome happens when the small muscle that connects the base of your spine to your hip (piriformis muscle) presses on the nerve that runs down the back of your leg (sciatic nerve). The piriformis muscle helps your hip rotate and helps to bring your leg back and out. It also helps shift your weight to keep you stable while you are walking. The sciatic nerve runs under or through the piriformis muscle. Damage to the piriformis muscle can cause spasms that put pressure on the nerve below. This causes pain and discomfort while sitting and moving. The pain may feel as if it begins in the buttock and spreads (radiates) down your hip and thigh. What are the causes? This condition is caused by pressure on the sciatic nerve from the piriformis muscle. The piriformis muscle can get irritated with overuse, especially if other hip muscles are weak and the piriformis muscle has to do extra work. Piriformis syndrome can also occur after an injury, like a fall onto your buttocks. What increases the risk? You are more likely to develop this condition if you:  Are a woman.  Sit for long periods of time.  Are a cyclist.  Have weak buttocks muscles (gluteal muscles). What are the signs or symptoms? Symptoms of this condition include:  Pain, tingling, or numbness that starts in the  buttock and runs down the back of your leg (sciatica).  Pain in the groin or thigh area. Your symptoms may get worse:  The longer you sit.  When you walk, run, or climb stairs.  When straining to have a bowel movement. How is this diagnosed? This condition is diagnosed based on your symptoms, medical history, and physical exam.  During the exam, your health care provider may: ? Move your leg into different positions to check for pain. ? Press on the muscles of your hip and buttock to see if that increases your symptoms.  You may also have tests, including: ? Imaging tests such as X-rays, MRI, or ultrasound. ? Electromyogram (EMG). This test measures electrical signals sent by your nerves into the muscles. ? Nerve conduction study. This test measures how well electrical signals pass through your nerves. How is this treated? This condition may be treated by:  Stopping all activities that cause pain or make your condition worse.  Applying ice or using heat therapy.  Taking medicines to reduce pain and swelling.  Taking a muscle relaxer (muscle relaxant) to stop muscle spasms.  Doing range-of-motion and strengthening exercises (physical therapy) as told by your health care provider.  Massaging the area.  Having acupuncture.  Getting an injection of medicine in the piriformis muscle. Your health care provider will choose the medicine based on your condition. He or she may inject: ? An anti-inflammatory medicine (  steroid) to reduce swelling. ? A numbing medicine (local anesthetic) to block the pain. ? Botulinum toxin. The toxin blocks nerve impulses to specific muscles to reduce muscle tension. In rare cases, you may need surgery to cut the muscle and release pressure on the nerve if other treatments do not work. Follow these instructions at home: Activity  Do not sit for long periods. Get up and walk around every 20 minutes or as often as told by your health care  provider. ? When driving long distances, make sure to take frequent stops to get up and stretch.  Use a cushion when you sit on hard surfaces.  Do exercises as told by your health care provider.  Return to your normal activities as told by your health care provider. Ask your health care provider what activities are safe for you. Managing pain, stiffness, and swelling      If directed, apply heat to the affected area as often as told by your health care provider. Use the heat source that your health care provider recommends, such as a moist heat pack or a heating pad. ? Place a towel between your skin and the heat source. ? Leave the heat on for 20-30 minutes. ? Remove the heat if your skin turns bright red. This is especially important if you are unable to feel pain, heat, or cold. You may have a greater risk of getting burned.  If directed, put ice on the injured area. ? Put ice in a plastic bag. ? Place a towel between your skin and the bag. ? Leave the ice on for 20 minutes, 2-3 times a day. General instructions  Take over-the-counter and prescription medicines only as told by your health care provider.  Ask your health care provider if the medicine prescribed to you requires you to avoid driving or using heavy machinery.  You may need to take actions to prevent or treat constipation, such as: ? Drink enough fluid to keep your urine pale yellow. ? Take over-the-counter or prescription medicines. ? Eat foods that are high in fiber, such as beans, whole grains, and fresh fruits and vegetables. ? Limit foods that are high in fat and processed sugars, such as fried or sweet foods.  Keep all follow-up visits as told by your health care provider. This is important. How is this prevented?  Do not sit for longer than 20 minutes at a time. When you sit, choose padded surfaces.  Warm up and stretch before being active.  Cool down and stretch after being active.  Give your body  time to rest between periods of activity.  Make sure to use equipment that fits you.  Maintain physical fitness, including: ? Strength. ? Flexibility. Contact a health care provider if:  Your pain and stiffness continue or get worse.  Your leg or hip becomes weak.  You have changes in your bowel function or bladder function. Summary  Piriformis syndrome is a condition that can cause pain, tingling, and numbness in your buttocks and down the back of your leg.  You may try applying heat or ice to relieve the pain.  Do not sit for long periods. Get up and walk around every 20 minutes or as often as told by your health care provider. This information is not intended to replace advice given to you by your health care provider. Make sure you discuss any questions you have with your health care provider. Document Revised: 03/11/2019 Document Reviewed: 07/15/2018 Elsevier Patient Education  [phone]  [employer].

## 2020-12-13 ENCOUNTER — Encounter: Payer: Self-pay | Admitting: Physical Therapy

## 2020-12-13 ENCOUNTER — Other Ambulatory Visit: Payer: Self-pay

## 2020-12-13 ENCOUNTER — Ambulatory Visit (INDEPENDENT_AMBULATORY_CARE_PROVIDER_SITE_OTHER): Payer: Medicare HMO | Admitting: Physical Therapy

## 2020-12-13 DIAGNOSIS — M25551 Pain in right hip: Secondary | ICD-10-CM

## 2020-12-13 DIAGNOSIS — M6281 Muscle weakness (generalized): Secondary | ICD-10-CM | POA: Diagnosis not present

## 2020-12-13 DIAGNOSIS — R262 Difficulty in walking, not elsewhere classified: Secondary | ICD-10-CM

## 2020-12-13 NOTE — Therapy (Signed)
[facility] Physical Therapy [address] [place], [place], [address] Phone: [phone]   Fax:  [phone]  Physical Therapy Evaluation  Patient Details  Name: Kim Anderson MRN: [id number] Date of Birth: 1951-04-26 Referring Provider (PT): Teressa Lower, MD   Encounter Date: 12/13/2020   PT End of Session - 12/13/20 1005    Visit Number 1    Number of Visits 12    Date for PT Re-Evaluation 01/24/21    Authorization Type Aetna [facility]    PT Start Time 0804    PT Stop Time 0845    PT Time Calculation (min) 41 min    Activity Tolerance Patient tolerated treatment well    Behavior During Therapy [facility] for tasks assessed/performed           Past Medical History:  Diagnosis Date  . History of chest pain   . Mitral valve prolapse    responded to beta-blockers. no evidence of this on 2-d echo/ happened long ago, has not been an issue for years   . Palpitations     Past Surgical History:  Procedure Laterality Date  . arm fracture Left 2017  . CHOLECYSTECTOMY  1997  . COLONOSCOPY  11/02/2007   normal  . LEFT SHOULDER ACROMINOPLASTY Left    cleaned it up    There were no vitals filed for this visit.    Subjective Assessment - 12/13/20 0803    Subjective She says pain since thanksigivng back of thigh into buttock and has been affecting walking, patient is pretty active, denies N/T and B/B, pain sometimes radiates down to knee    Limitations Sitting;Walking;Standing    How long can you sit comfortably? has to adjust constantly to be comfortable    How long can you walk comfortably? 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  No anterior listhesis or severe malalignment. Right hip: Right hip effectively normal-appearing no fractures or severe degenerative changes hip or pelvis.    Patient Stated Goals get back to walking 3  miles a day without pain    Currently in Pain? 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  No    Is the patient reluctant to leave their home because of a fear of falling?  No      Home Tourist information centre manager residence    Additional Comments 2 steps to enter      Prior Function   Level of Independence Independent    Vocation Retired    Leisure walk      Cognition   Overall Cognitive Status Within Functional Limits for tasks assessed      Observation/Other Assessments   Focus on Therapeutic Outcomes (FOTO)  2nd visit, front office did not capture on eval      Posture/Postural Control   Posture Comments slight lateral shift to the right in standing, mild scoliosis      ROM / Strength   AROM / PROM / Strength AROM;Strength      AROM   Overall AROM Comments no pain with any lumbar movement except flexion    AROM Assessment Site Lumbar    Lumbar Flexion 75%, pull in Rt posterior leg    Lumbar Extension WFL    Lumbar -  Right Side Bend WFL    Lumbar - Left Side Bend WFL    Lumbar - Right Rotation WFL    Lumbar - Left Rotation WFL      Strength   Overall Strength Comments Rt knee flexion was 4-, Rt knee extension 4+, Rt hip flexion 5, Rt hip abd 4+, Rt hip extension 4-      Flexibility   Soft Tissue Assessment /Muscle Length --   mild tightness in Rt H.S. and IT band     Palpation   Palpation comment denies lumbar pain or reproduced pain with lumbar palpation and mobs, did have TTP in glutes/over greater trochanter, IT band      Special Tests    Special Tests Lumbar    Other special tests negative SLR, negative slump, no change with repeated lateral trunk glide or repeated extensions      Ambulation/Gait   Gait Comments community ambulator without AD, slight antlagic gait on Rt with inceased weight shift to her left and trunk lean to her left, decreased stance time on Rt                      Objective measurements completed on examination: See above findings.       [facility] Adult PT Treatment/Exercise - 12/13/20 0001      Exercises   Exercises Knee/Hip      Knee/Hip Exercises: Aerobic   Recumbent Bike 8 min L2      Modalities   Modalities Iontophoresis      Iontophoresis   Type of Iontophoresis Dexamethasone    Location Rt hamstring origin at ischial tuberosity    Dose 1.0 CC    Time 4-6 hour wear home patch                  PT Education - 12/13/20 1004    Education Details HEP, exam findings, POC    Person(s) Educated Patient    Methods Explanation;Demonstration;Verbal cues;Handout    Comprehension Verbalized understanding;Returned demonstration;Need further instruction            PT Short Term Goals - 12/13/20 1023      PT SHORT TERM GOAL #1   Title She will be independent with inital HEP    Time  4    Period Weeks    Status New    Target Date 01/10/21      PT SHORT TERM GOAL #2   Title She will be able to walk 1 mile with less than 3/10 pain.     Baseline pain limits her from walking currenlty    Status New      PT SHORT TERM GOAL #3   Title -      PT SHORT TERM GOAL #4   Title -      PT SHORT TERM GOAL #5   Title -             PT Long Term Goals - 12/13/20 1025      PT LONG TERM GOAL #1   Title she will be independent with all HEP issued    Baseline no HEP until today    Time 6    Period Weeks    Status New    Target Date 01/24/21      PT LONG TERM GOAL #2   Title She will be able to walk 3 miles with less than 3/10 pain.    Baseline pain limits her from walking currenlty    Time 6    Period Weeks    Status New      PT LONG TERM GOAL #3   Title She will improve Rt hip strength to 4+ grossly    Baseline 4-, 4    Time 6    Period Weeks    Status New      PT LONG TERM GOAL #4   Title will do FOTO functional outcome measure and will write goal for this once completed    Time 6    Period Weeks    Status New      PT LONG TERM GOAL #5   Title -                  Plan - 12/13/20 1011    Clinical Impression Statement Pt presents with Rt posterior hip and leg pain down to her lateral knee. She denies N/T and did this did not present as lumbar radiculopathy today as speical testing was negative for lumbar and pain was not reproduced with lumbar movements or palpation. Pain presents more consistent with  hamsting/glute strain/tendonopaty, and IT band syndrome. She will benefit from skilled PT to address her functional deficits in standing/walking tolerance, strength, tightness, and pain.    Examination-Activity Limitations Bend;Squat;Stand;Lift;Locomotion Level    Examination-Participation Restrictions Cleaning;Community Activity;Driving;Shop    Stability/Clinical Decision Making Stable/Uncomplicated    Clinical Decision Making Low    Rehab Potential Excellent    PT Frequency 2x / week   1-2   PT Duration 6 weeks    PT Treatment/Interventions ADLs/Self Care Home Management;Aquatic  Therapy;Cryotherapy;Electrical Stimulation;Iontophoresis 4mg /ml Dexamethasone;Moist Heat;Traction;Ultrasound;Gait training;Therapeutic activities;Therapeutic exercise;Balance training;Neuromuscular re-education;Patient/family education;Manual techniques;Passive range of motion;Dry needling;Taping;Spinal Manipulations;Joint Manipulations    PT Next Visit Plan review and update HEP as needed, bike warm up, needs stretching, hip strength, how was ionto (has aetna MCR so unsure if they cover ionto so was not billed on eval)    PT Home Exercise Plan Access Code: [email or web address]    Consulted and Agree with Plan of Care Patient           Patient will benefit from skilled therapeutic intervention in order to improve the following deficits and impairments:  Difficulty walking,Decreased activity tolerance,Decreased mobility,Decreased range of motion,Decreased strength,Increased fascial restricitons,Increased muscle spasms,Impaired flexibility,Postural  dysfunction,Pain  Visit Diagnosis: Pain in right hip  Muscle weakness (generalized)  Difficulty in walking, not elsewhere classified     Problem List Patient Active Problem List   Diagnosis Date Noted  . Routine general medical examination at a health care facility 04/10/2015  . PALPITATIONS 05/30/2009    Birdie RiddleBrian R Ilya Ess,PT,DPT 12/13/2020, 10:31 AM  [facility] Therapy [address] [place], [place], [address] Phone: [phone]   Fax:  [phone]  Name: Kim Anderson MRN: [id number] Date of Birth: 06/13/1951

## 2020-12-13 NOTE — Patient Instructions (Signed)
Access Code: [email or web address] URL: [email or web address] Date: 12/13/2020 Prepared by: Ivery Quale  Exercises Seated Hamstring Stretch - 2 x daily - 6 x weekly - 1 sets - 3 reps - 30 hold Seated Piriformis Stretch with Trunk Bend - 2 x daily - 6 x weekly - 1 sets - 2-3 reps - 30 hold Standing ITB Stretch - 2 x daily - 6 x weekly - 1 sets - 2-3 reps - 30 hold Standing Hamstring Curl with Resistance - 2 x daily - 6 x weekly - 1-2 sets - 10 reps Hip Abduction with Resistance Loop - 2 x daily - 6 x weekly - 1-2 sets - 15-20 reps Hip Extension with Resistance Loop - 2 x daily - 6 x weekly - 1-2 sets - 15-20 reps

## 2020-12-20 ENCOUNTER — Ambulatory Visit: Payer: Medicare HMO | Admitting: Physical Therapy

## 2020-12-20 ENCOUNTER — Other Ambulatory Visit: Payer: Self-pay

## 2020-12-20 DIAGNOSIS — M25551 Pain in right hip: Secondary | ICD-10-CM | POA: Diagnosis not present

## 2020-12-20 DIAGNOSIS — R262 Difficulty in walking, not elsewhere classified: Secondary | ICD-10-CM

## 2020-12-20 DIAGNOSIS — M6281 Muscle weakness (generalized): Secondary | ICD-10-CM

## 2020-12-20 NOTE — Therapy (Addendum)
[facility] Physical Therapy [address] [place], [place], [address] Phone: [phone]   Fax:  [phone]  Physical Therapy Treatment During this treatment session, this physical therapist was present, participating in and directing the treatment.   This note has been reviewed and this clinician agrees with the information provided.   Ivery Quale, PT, DPT 12/20/20 9:21 AM   Patient Details  Name: WESTON FULCO MRN: [id number] Date of Birth: [birth year] Referring Provider (PT): Teressa Lower, MD   Encounter Date: 12/20/2020   PT End of Session - 12/20/20 0856    Visit Number 2    Number of Visits 12    Date for PT Re-Evaluation 01/24/21    Authorization Type Aetna [facility]    PT Start Time 0803    PT Stop Time 0855    PT Time Calculation (min) 52 min    Activity Tolerance Patient tolerated treatment well    Behavior During Therapy [facility] for tasks assessed/performed           Past Medical History:  Diagnosis Date  . History of chest pain   . Mitral valve prolapse    responded to beta-blockers. no evidence of this on 2-d echo/ happened long ago, has not been an issue for years   . Palpitations     Past Surgical History:  Procedure Laterality Date  . arm fracture Left 2017  . CHOLECYSTECTOMY  1997  . COLONOSCOPY  11/02/2007   normal  . LEFT SHOULDER ACROMINOPLASTY Left    cleaned it up    There were no vitals filed for this visit.   Subjective Assessment - 12/20/20 5188    Subjective Patient reports she has been doing well with her exercises until saturday had a small slip while helping her neightbor carrying laundry where she felt a painful pull. She iced that night and felt some relief. She also states that the ionto patch relieved her pain the rest of the day last visit. Her pain starts posteriorly and then throughout the day radiates into glute and groin.    Limitations Sitting;Walking;Standing    How long can you sit comfortably? has to adjust  constantly to be comfortable    How long can you walk comfortably? 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  No anterior listhesis or severe malalignment. Right hip: Right hip effectively normal-appearing no fractures or severe degenerative changes hip or pelvis.    Patient Stated Goals get back to walking 3 miles a day without pain    Currently in Pain? Yes    Pain Score --   reports pain is not bad today   Pain Location Hip    Pain Orientation Posterior;Anterior;Lateral    Pain Descriptors / Indicators Aching                             [facility] Adult PT Treatment/Exercise - 12/20/20 0826      Knee/Hip Exercises: Stretches   Active Hamstring Stretch 30 seconds;3 reps   standing with foot on step   Gastroc Stretch 3 reps;30 seconds      Knee/Hip Exercises: Aerobic   Stationary Bike 8 min L1      Knee/Hip Exercises: Machines for Strengthening   Cybex Knee Extension 2x15 15lbs    Cybex Knee Flexion 2X15, 25  lbs    Cybex Leg Press 2x15 SL 62 lbs      Knee/Hip Exercises: Standing   Hip Abduction 15 reps;2 sets    Abduction Limitations 2 lbs    Hip Extension 2 sets;15 reps    Extension Limitations 2lbs    Other Standing Knee Exercises hamstring curls 2x15; 2 lb      Knee/Hip Exercises: Supine   Bridges 2 sets;10 reps   5 second hold     Modalities   Modalities Iontophoresis      Iontophoresis   Type of Iontophoresis Dexamethasone    Location Rt hamstring origin at ischial tuberosity    Dose 1.0 CC    Time 4-6 hour wear home patch      Manual Therapy   Manual Therapy Soft tissue mobilization    Soft tissue mobilization IASTM to posterior thigh/glute with massage tool and percussion gun, T.P release to hamstings, X 10 min                    PT Short Term Goals - 12/13/20 1023      PT SHORT TERM GOAL #1    Title She will be independent with inital HEP    Time 4    Period Weeks    Status New    Target Date 01/10/21      PT SHORT TERM GOAL #2   Title She will be able to walk 1 mile with less than 3/10 pain.    Baseline pain limits her from walking currenlty    Status New      PT SHORT TERM GOAL #3   Title -      PT SHORT TERM GOAL #4   Title -      PT SHORT TERM GOAL #5   Title -             PT Long Term Goals - 12/13/20 1025      PT LONG TERM GOAL #1   Title she will be independent with all HEP issued    Baseline no HEP until today    Time 6    Period Weeks    Status New    Target Date 01/24/21      PT LONG TERM GOAL #2   Title She will be able to walk 3 miles with less than 3/10 pain.    Baseline pain limits her from walking currenlty    Time 6    Period Weeks    Status New      PT LONG TERM GOAL #3   Title She will improve Rt hip strength to 4+ grossly    Baseline 4-, 4    Time 6    Period Weeks    Status New      PT LONG TERM GOAL #4   Title will do FOTO functional outcome measure and will write goal for this once completed    Time 6    Period Weeks    Status New      PT LONG TERM GOAL #5   Title -                 Plan - 12/20/20 0836    Clinical Impression Statement Patient presented with minimal pain today and noted none of the exercises aggravated any pain. She is working well with increasing mobility in her stretches and progressing toward increased hip strength. Pt. felt some relief with theragun and IASTM to alleviate tightness and  pain.    Examination-Activity Limitations Bend;Squat;Stand;Lift;Locomotion Level    Examination-Participation Restrictions Cleaning;Community Activity;Driving;Shop    Stability/Clinical Decision Making Stable/Uncomplicated    Rehab Potential Excellent    PT Frequency 2x / week   1-2   PT Duration 6 weeks    PT Treatment/Interventions ADLs/Self Care Home Management;Aquatic Therapy;Cryotherapy;Electrical  Stimulation;Iontophoresis 4mg /ml Dexamethasone;Moist Heat;Traction;Ultrasound;Gait training;Therapeutic activities;Therapeutic exercise;Balance training;Neuromuscular re-education;Patient/family education;Manual techniques;Passive range of motion;Dry needling;Taping;Spinal Manipulations;Joint Manipulations    PT Next Visit Plan review and update HEP to add additional strengthening exercises(bridges and hip ext/abd), continue STM, TENS? Add in adductor stretches/adductor ballsqueeze    PT Home Exercise Plan Access Code: [email or web address]    Consulted and Agree with Plan of Care Patient           Patient will benefit from skilled therapeutic intervention in order to improve the following deficits and impairments:  Difficulty walking,Decreased activity tolerance,Decreased mobility,Decreased range of motion,Decreased strength,Increased fascial restricitons,Increased muscle spasms,Impaired flexibility,Postural dysfunction,Pain  Visit Diagnosis: Pain in right hip  Muscle weakness (generalized)  Difficulty in walking, not elsewhere classified     Problem List Patient Active Problem List   Diagnosis Date Noted  . Routine general medical examination at a health care facility 04/10/2015  . PALPITATIONS 05/30/2009    06/01/2009, SPT 12/20/2020, 9:04 AM  [facility] Physical Therapy [address] [place], [place], [place] Phone: [phone]   Fax:  [phone]  Name: ELIANNA WINDOM MRN: Marjie Skiff Date of Birth: September 20, 1951

## 2020-12-25 ENCOUNTER — Other Ambulatory Visit: Payer: Self-pay

## 2020-12-25 ENCOUNTER — Ambulatory Visit: Payer: Medicare HMO | Admitting: Physical Therapy

## 2020-12-25 DIAGNOSIS — M25551 Pain in right hip: Secondary | ICD-10-CM | POA: Diagnosis not present

## 2020-12-25 DIAGNOSIS — R262 Difficulty in walking, not elsewhere classified: Secondary | ICD-10-CM

## 2020-12-25 DIAGNOSIS — M6281 Muscle weakness (generalized): Secondary | ICD-10-CM

## 2020-12-25 NOTE — Therapy (Signed)
[facility] Physical Therapy [address] [place], [place], [address] Phone: [phone]   Fax:  [phone]  Physical Therapy Treatment  Patient Details  Name: Kim Anderson MRN: [id number] Date of Birth: 1951-08-02 Referring Provider (PT): Teressa Lower, MD   Encounter Date: 12/25/2020   PT End of Session - 12/25/20 0855    Visit Number 3    Number of Visits 12    Date for PT Re-Evaluation 01/24/21    Authorization Type Aetna [facility]    PT Start Time 0803    PT Stop Time 0850    PT Time Calculation (min) 47 min    Activity Tolerance Patient tolerated treatment well    Behavior During Therapy [facility] for tasks assessed/performed           Past Medical History:  Diagnosis Date  . History of chest pain   . Mitral valve prolapse    responded to beta-blockers. no evidence of this on 2-d echo/ happened long ago, has not been an issue for years   . Palpitations     Past Surgical History:  Procedure Laterality Date  . arm fracture Left 2017  . CHOLECYSTECTOMY  1997  . COLONOSCOPY  11/02/2007   normal  . LEFT SHOULDER ACROMINOPLASTY Left    cleaned it up    There were no vitals filed for this visit.   Subjective Assessment - 12/25/20 0802    Subjective PAtient says she has been doing well since the last visit until this morning she has had a sharp pain in her butttock when she sits in the car for driving. Besides this morning flare up she reports that she has been able to walk 2 miles without being limited by pain.    Limitations Sitting;Walking;Standing    How long can you sit comfortably? has to adjust constantly to be comfortable    How long can you walk comfortably? 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  No anterior listhesis or severe malalignment. Right hip: Right hip effectively normal-appearing no fractures or  severe degenerative changes hip or pelvis.    Patient Stated Goals get back to walking 3 miles a day without pain    Currently in Pain? Yes    Pain Location Buttocks                             [facility] Adult PT Treatment/Exercise - 12/25/20 0001      Knee/Hip Exercises: Stretches   Active Hamstring Stretch 30 seconds;3 reps    Piriformis Stretch 2 reps;30 seconds;Right    Gastroc Stretch 3 reps;30 seconds      Knee/Hip Exercises: Aerobic   Stationary Bike 8 min L2      Knee/Hip Exercises: Machines for Strengthening   Cybex Knee Extension 2x15 15lbs    Cybex Knee Flexion 2x15, 25 lbs    Cybex Leg Press 2x10 88      Knee/Hip Exercises: Standing   Hip Abduction 2 sets;10 reps    Abduction Limitations 2.5    Hip Extension 2 sets;10 reps    Extension Limitations 2.5    Other Standing Knee Exercises hamstring curls 2x10, 2.5 lbs      Knee/Hip Exercises: Supine   Bridges 2 sets;10 reps      Modalities   Modalities Iontophoresis  Iontophoresis   Type of Iontophoresis Dexamethasone    Location just proximal to Rt. hamstring origin    Dose 1.0 CC    Time 4-6 hour wear home patch      Manual Therapy   Manual Therapy Soft tissue mobilization    Soft tissue mobilization TP release with PROM and percussion gun 10 mins                    PT Short Term Goals - 12/25/20 0809      PT SHORT TERM GOAL #1   Title She will be independent with inital HEP    Time 4    Period Weeks    Status On-going    Target Date 01/10/21      PT SHORT TERM GOAL #2   Title She will be able to walk 1 mile with less than 3/10 pain.    Baseline pain limits her from walking currenlty    Status Achieved      PT SHORT TERM GOAL #3   Title -      PT SHORT TERM GOAL #4   Title -      PT SHORT TERM GOAL #5   Title -             PT Long Term Goals - 12/25/20 0810      PT LONG TERM GOAL #1   Title she will be independent with all HEP issued    Baseline no HEP  until today    Time 6    Period Weeks    Status On-going      PT LONG TERM GOAL #2   Title She will be able to walk 3 miles with less than 3/10 pain.    Baseline pain limits her from walking currenlty    Time 6    Period Weeks    Status On-going      PT LONG TERM GOAL #3   Title She will improve Rt hip strength to 4+ grossly    Baseline 4-, 4    Time 6    Period Weeks    Status On-going      PT LONG TERM GOAL #4   Title will do FOTO functional outcome measure and will write goal for this once completed    Time 6    Period Weeks    Status New      PT LONG TERM GOAL #5   Title -                 Plan - 12/25/20 0856    Clinical Impression Statement Patient presented a flare up in pain originating in the buttocks region with driving today, added in a piriformis stretch to target that area and patient tolerated this well. Pt. felt additional relief with TP release to piriformis and theragun. Patient is progressing well towards reaching her goals and is increasing her strength and endurance. Will reassess her strength next visit and check in about any flare ups in between that time.    Examination-Activity Limitations Bend;Squat;Stand;Lift;Locomotion Level    Examination-Participation Restrictions Cleaning;Community Activity;Driving;Shop    Stability/Clinical Decision Making Stable/Uncomplicated    Rehab Potential Excellent    PT Frequency 2x / week   1-2   PT Duration 6 weeks    PT Treatment/Interventions ADLs/Self Care Home Management;Aquatic Therapy;Cryotherapy;Electrical Stimulation;Iontophoresis 4mg /ml Dexamethasone;Moist Heat;Traction;Ultrasound;Gait training;Therapeutic activities;Therapeutic exercise;Balance training;Neuromuscular re-education;Patient/family education;Manual techniques;Passive range of motion;Dry needling;Taping;Spinal Manipulations;Joint Manipulations    PT Next Visit  Plan Adductor stretches, exercises to address strength deficit, retest strength     PT Home Exercise Plan Access Code: [email or web address]    Consulted and Agree with Plan of Care Patient           Patient will benefit from skilled therapeutic intervention in order to improve the following deficits and impairments:  Difficulty walking,Decreased activity tolerance,Decreased mobility,Decreased range of motion,Decreased strength,Increased fascial restricitons,Increased muscle spasms,Impaired flexibility,Postural dysfunction,Pain  Visit Diagnosis: Pain in right hip  Muscle weakness (generalized)  Difficulty in walking, not elsewhere classified     Problem List Patient Active Problem List   Diagnosis Date Noted  . Routine general medical examination at a health care facility 04/10/2015  . PALPITATIONS 05/30/2009    Ritta Slot, SPT 12/25/2020, 9:01 AM   During this treatment session, this physical therapist was present, participating in and directing the treatment.   This note has been reviewed and this clinician agrees with the information provided.   Ivery Quale, PT, DPT 12/25/20 11:26 AM   [facility] Physical Therapy [address] [place], [place], [address] Phone: [phone]   Fax:  [phone]  Name: Kim Anderson MRN: [id number] Date of Birth: [birth year]

## 2020-12-29 ENCOUNTER — Encounter: Payer: Self-pay | Admitting: Rehabilitative and Restorative Service Providers"

## 2020-12-29 ENCOUNTER — Other Ambulatory Visit: Payer: Self-pay

## 2020-12-29 ENCOUNTER — Ambulatory Visit: Payer: Medicare HMO | Admitting: Rehabilitative and Restorative Service Providers"

## 2020-12-29 DIAGNOSIS — R262 Difficulty in walking, not elsewhere classified: Secondary | ICD-10-CM | POA: Diagnosis not present

## 2020-12-29 DIAGNOSIS — M6281 Muscle weakness (generalized): Secondary | ICD-10-CM | POA: Diagnosis not present

## 2020-12-29 DIAGNOSIS — M25551 Pain in right hip: Secondary | ICD-10-CM

## 2020-12-29 NOTE — Therapy (Signed)
[facility] Physical Therapy [address] [place], [place], [address] Phone: [phone]   Fax:  [phone]  Physical Therapy Treatment  Patient Details  Name: Kim Anderson MRN: [id number] Date of Birth: [birth year] Referring Provider (PT): Teressa Lower, MD   Encounter Date: 12/29/2020   PT End of Session - 12/29/20 0845    Visit Number 4    Number of Visits 12    Date for PT Re-Evaluation 01/24/21    Authorization Type [employer]et[employer] MCR    Progress Note Due on Visit 10    PT Start Time 0843    PT Stop Time 0921    PT Time Calculation (min) 38 min    Activity Tolerance Patient tolerated treatment well    Behavior During Therapy [facility] for tasks assessed/performed           Past Medical History:  Diagnosis Date  . History of chest pain   . Mitral valve prolapse    responded to beta-blockers. no evidence of this on 2-d echo/ happened long ago, has not been an issue for years   . Palpitations     Past Surgical History:  Procedure Laterality Date  . arm fracture Left 2017  . CHOLECYSTECTOMY  1997  . COLONOSCOPY  11/02/2007   normal  . LEFT SHOULDER ACROMINOPLASTY Left    cleaned it up    There were no vitals filed for this visit.   Subjective Assessment - 12/29/20 0846    Subjective Pt. indicated walking for several miles without problems but not back to usual walking.  Pt. stated having one spot on back with symptoms noted c driving in to clinic today.    Limitations Sitting;Walking;Standing    How long can you sit comfortably? has to adjust constantly to be comfortable    How long can you walk comfortably? 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  No anterior listhesis or severe malalignment. Right hip: Right hip effectively normal-appearing no fractures or severe degenerative changes hip or pelvis.    Patient Stated  Goals get back to walking 3 miles a day without pain    Currently in Pain? Yes    Pain Location Back    Aggravating Factors  driving, prolonged standing/walking > 2 miles              [facility] PT Assessment - 12/29/20 0001      AROM   Lumbar Flexion to mid shin, mild reduced complaints after performance x 5    Lumbar Extension 100% no complaints                         [facility] Adult PT Treatment/Exercise - 12/29/20 0001      Knee/Hip Exercises: Stretches   Other Knee/Hip Stretches supine passive hamstring stretch c contralteral SLR 2 x 10 bilateral      Knee/Hip Exercises: Machines for Strengthening   Cybex Leg Press 3 x 10 88 lbs      Knee/Hip Exercises: Standing   Hip Abduction 2 sets;10 reps;Both   3 lbs   Hip Extension 2 sets;10 reps;Both   3 lbs   Other Standing Knee Exercises lumbar flexion x 5 standing    Other Standing Knee Exercises hamstirng curls 3 lbs 2 x 10 eccentric focus      Manual Therapy  Soft tissue mobilization Percussive device to Rt glute max, piriformis, hamstring                  PT Education - 12/29/20 0908    Education Details DN education, HEP exercise addition    Person(s) Educated Patient    Methods Explanation;Demonstration;Verbal cues    Comprehension Verbalized understanding;Returned demonstration            PT Short Term Goals - 12/25/20 0809      PT SHORT TERM GOAL #1   Title She will be independent with inital HEP    Time 4    Period Weeks    Status On-going    Target Date 01/10/21      PT SHORT TERM GOAL #2   Title She will be able to walk 1 mile with less than 3/10 pain.    Baseline pain limits her from walking currenlty    Status Achieved      PT SHORT TERM GOAL #3   Title -      PT SHORT TERM GOAL #4   Title -      PT SHORT TERM GOAL #5   Title -             PT Long Term Goals - 12/25/20 0810      PT LONG TERM GOAL #1   Title she will be independent with all HEP issued    Baseline  no HEP until today    Time 6    Period Weeks    Status On-going      PT LONG TERM GOAL #2   Title She will be able to walk 3 miles with less than 3/10 pain.    Baseline pain limits her from walking currenlty    Time 6    Period Weeks    Status On-going      PT LONG TERM GOAL #3   Title She will improve Rt hip strength to 4+ grossly    Baseline 4-, 4    Time 6    Period Weeks    Status On-going      PT LONG TERM GOAL #4   Title will do FOTO functional outcome measure and will write goal for this once completed    Time 6    Period Weeks    Status New      PT LONG TERM GOAL #5   Title -                 Plan - 12/29/20 0908    Clinical Impression Statement Discussion of Lt lateral lower rib complaints c standing/walking followed up palpation assessment revealing no specific tenderness in area.  PAIVM in lower thoracic cPA and lumbar as well as rib springing on Lt revealed no concordant symptoms.  Mild tenderness in glute max on Rt as well as distal lateral hamstring in manual percussive device intervention.  Deferred DN today but education performed.    Examination-Activity Limitations Bend;Squat;Stand;Lift;Locomotion Level    Examination-Participation Restrictions Cleaning;Community Activity;Driving;Shop    Stability/Clinical Decision Making Stable/Uncomplicated    Rehab Potential Excellent    PT Frequency 2x / week   1-2   PT Duration 6 weeks    PT Treatment/Interventions ADLs/Self Care Home Management;Aquatic Therapy;Cryotherapy;Electrical Stimulation;Iontophoresis 4mg /ml Dexamethasone;Moist Heat;Traction;Ultrasound;Gait training;Therapeutic activities;Therapeutic exercise;Balance training;Neuromuscular re-education;Patient/family education;Manual techniques;Passive range of motion;Dry needling;Taping;Spinal Manipulations;Joint Manipulations    PT Next Visit Plan Adductor stretches, exercises to address strength deficit, reassess need for DN prn  PT Home Exercise Plan  Access Code: [email or web address]    Consulted and Agree with Plan of Care Patient           Patient will benefit from skilled therapeutic intervention in order to improve the following deficits and impairments:  Difficulty walking,Decreased activity tolerance,Decreased mobility,Decreased range of motion,Decreased strength,Increased fascial restricitons,Increased muscle spasms,Impaired flexibility,Postural dysfunction,Pain  Visit Diagnosis: Pain in right hip  Muscle weakness (generalized)  Difficulty in walking, not elsewhere classified     Problem List Patient Active Problem List   Diagnosis Date Noted  . Routine general medical examination at a health care facility 04/10/2015  . PALPITATIONS 05/30/2009    Chyrel Masson, PT, DPT, OCS, ATC 12/29/20  9:22 AM    [facility] Physical Therapy [address] [place], [place], [address] Phone: [phone]   Fax:  [phone]  Name: LIZANDRA ZAKRZEWSKI MRN: [id number] Date of Birth: Oct 01, 1951

## 2021-01-01 ENCOUNTER — Ambulatory Visit: Payer: Medicare HMO | Admitting: Physical Therapy

## 2021-01-01 ENCOUNTER — Encounter: Payer: Medicare HMO | Admitting: Physical Therapy

## 2021-01-01 ENCOUNTER — Other Ambulatory Visit: Payer: Self-pay

## 2021-01-01 DIAGNOSIS — R262 Difficulty in walking, not elsewhere classified: Secondary | ICD-10-CM | POA: Diagnosis not present

## 2021-01-01 DIAGNOSIS — M6281 Muscle weakness (generalized): Secondary | ICD-10-CM

## 2021-01-01 DIAGNOSIS — M25551 Pain in right hip: Secondary | ICD-10-CM | POA: Diagnosis not present

## 2021-01-01 NOTE — Therapy (Signed)
[facility] Physical Therapy [address] [place], [place], [address] Phone: [phone]   Fax:  [phone]  Physical Therapy Treatment  Patient Details  Name: Kim Anderson MRN: [id number] Date of Birth: March 30, 1951 Referring Provider (PT): Teressa Lower, MD   Encounter Date: 01/01/2021   PT End of Session - 01/01/21 1242    Visit Number 5    Number of Visits 12    Date for PT Re-Evaluation 01/24/21    Authorization Type Aetna MCR    Progress Note Due on Visit 10    PT Start Time 1057    PT Stop Time 1145    PT Time Calculation (min) 48 min    Activity Tolerance Patient tolerated treatment well    Behavior During Therapy [facility] for tasks assessed/performed           Past Medical History:  Diagnosis Date  . History of chest pain   . Mitral valve prolapse    responded to beta-blockers. no evidence of this on 2-d echo/ happened long ago, has not been an issue for years   . Palpitations     Past Surgical History:  Procedure Laterality Date  . arm fracture Left 2017  . CHOLECYSTECTOMY  1997  . COLONOSCOPY  11/02/2007   normal  . LEFT SHOULDER ACROMINOPLASTY Left    cleaned it up    There were no vitals filed for this visit.   Subjective Assessment - 01/01/21 1104    Subjective Patient relays she is back to walking 3 miles and has no pain just mild soreness afterwards. She does still note difficulty with stepping up at times.    Limitations Sitting;Walking;Standing    How long can you sit comfortably? has to adjust constantly to be comfortable    How long can you walk comfortably? 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  No anterior listhesis or severe malalignment. Right hip: Right hip effectively normal-appearing no fractures or severe degenerative changes hip or pelvis.    Patient Stated Goals get back to  walking 3 miles a day without pain                             [facility] Adult PT Treatment/Exercise - 01/01/21 0001      Knee/Hip Exercises: Stretches   Active Hamstring Stretch 30 seconds;3 reps    Piriformis Stretch 2 reps;30 seconds;Right    Other Knee/Hip Stretches standing ITB R stretch 15 sec x4      Knee/Hip Exercises: Aerobic   Stationary Bike 8 min L3      Knee/Hip Exercises: Machines for Strengthening   Cybex Knee Extension 2x15 15lbs    Cybex Leg Press 3 x 10 88 lbs      Knee/Hip Exercises: Standing   Hip Abduction 2 sets;10 reps;Both    Abduction Limitations 3lbs    Hip Extension 2 sets;10 reps;Both    Extension Limitations 3 lbs    Lateral Step Up Right;2 sets;10 reps;Hand Hold: 0;Step Height: 6"    Step Down Limitations 2x10 with retro step up    Other Standing Knee Exercises hamstirng curls 3 lbs 2 x 10 eccentric focus      Manual Therapy   Soft tissue mobilization STM to posterior thigh to address tightness, 5 min  PT Short Term Goals - 12/25/20 0809      PT SHORT TERM GOAL #1   Title She will be independent with inital HEP    Time 4    Period Weeks    Status On-going    Target Date 01/10/21      PT SHORT TERM GOAL #2   Title She will be able to walk 1 mile with less than 3/10 pain.    Baseline pain limits her from walking currenlty    Status Achieved      PT SHORT TERM GOAL #3   Title -      PT SHORT TERM GOAL #4   Title -      PT SHORT TERM GOAL #5   Title -             PT Long Term Goals - 01/01/21 1245      PT LONG TERM GOAL #1   Title she will be independent with all HEP issued    Baseline no HEP until today    Time 6    Period Weeks    Status On-going      PT LONG TERM GOAL #2   Title She will be able to walk 3 miles with less than 3/10 pain.    Baseline pain limits her from walking currenlty    Time 6    Period Weeks    Status Achieved      PT LONG TERM GOAL #3   Title She  will improve Rt hip strength to 4+ grossly    Baseline 4-, 4    Time 6    Period Weeks    Status On-going      PT LONG TERM GOAL #4   Title will do FOTO functional outcome measure and will write goal for this once completed    Time 6    Period Weeks    Status New      PT LONG TERM GOAL #5   Title -                 Plan - 01/01/21 1243    Clinical Impression Statement Patient had no complaints of any pain today and has progressed towards her goals. Added in more functional stair activity today and patient tolerated well. Advised patient to continue walking and monitor any symptoms of tightness or pain. Reassess patient strength next visit and possibly increase weight on knee ext/flex.    Examination-Activity Limitations Bend;Squat;Stand;Lift;Locomotion Level    Examination-Participation Restrictions Cleaning;Community Activity;Driving;Shop    Stability/Clinical Decision Making Stable/Uncomplicated    Rehab Potential Excellent    PT Frequency 2x / week   1-2   PT Duration 6 weeks    PT Treatment/Interventions ADLs/Self Care Home Management;Aquatic Therapy;Cryotherapy;Electrical Stimulation;Iontophoresis 4mg /ml Dexamethasone;Moist Heat;Traction;Ultrasound;Gait training;Therapeutic activities;Therapeutic exercise;Balance training;Neuromuscular re-education;Patient/family education;Manual techniques;Passive range of motion;Dry needling;Taping;Spinal Manipulations;Joint Manipulations    PT Next Visit Plan increase knee flex/ext weights exercises to address strength deficit, reassess need for DN prn    PT Home Exercise Plan Access Code: [email or web address]    Consulted and Agree with Plan of Care Patient           Patient will benefit from skilled therapeutic intervention in order to improve the following deficits and impairments:  Difficulty walking,Decreased activity tolerance,Decreased mobility,Decreased range of motion,Decreased strength,Increased fascial restricitons,Increased muscle  spasms,Impaired flexibility,Postural dysfunction,Pain  Visit Diagnosis: Pain in right hip  Difficulty in walking, not elsewhere classified  Muscle weakness (generalized)     Problem  List Patient Active Problem List   Diagnosis Date Noted  . Routine general medical examination at a health care facility 04/10/2015  . PALPITATIONS 05/30/2009    Ritta Slot, SPT 01/01/2021, 12:46 PM   During this treatment session, this physical therapist was present, participating in and directing the treatment.   This note has been reviewed and this clinician agrees with the information provided.    Ivery Quale, PT, DPT 01/01/21 2:43 PM  [facility] Physical Therapy [address] [place], [place], [address] Phone: [phone]   Fax:  [phone]  Name: Kim Anderson MRN: [id number] Date of Birth: Jan 28, 1951

## 2021-01-04 ENCOUNTER — Encounter: Payer: Medicare HMO | Admitting: Rehabilitative and Restorative Service Providers"

## 2021-01-08 ENCOUNTER — Other Ambulatory Visit: Payer: Self-pay

## 2021-01-08 ENCOUNTER — Ambulatory Visit: Payer: Medicare HMO | Admitting: Physical Therapy

## 2021-01-08 DIAGNOSIS — M25551 Pain in right hip: Secondary | ICD-10-CM

## 2021-01-08 DIAGNOSIS — R262 Difficulty in walking, not elsewhere classified: Secondary | ICD-10-CM

## 2021-01-08 DIAGNOSIS — M6281 Muscle weakness (generalized): Secondary | ICD-10-CM

## 2021-01-08 NOTE — Therapy (Addendum)
[facility] [address] [place], [place], [address] Phone: [phone]   Fax:  [phone]  Physical Therapy Treatment/Discharge  Patient Details  Name: Kim Anderson MRN: [id number] Date of Birth: 07-22-1951 Referring Provider (PT): Steva Colder, MD   Encounter Date: 01/08/2021   PT End of Session - 01/08/21 0852    Visit Number 6    Number of Visits 12    Date for PT Re-Evaluation 01/24/21    Authorization Type [employer] MCR    Progress Note Due on Visit 10    PT Start Time 0803    PT Stop Time 0845    PT Time Calculation (min) 42 min    Activity Tolerance Patient tolerated treatment well    Behavior During Therapy [facility] for tasks assessed/performed           Past Medical History:  Diagnosis Date  . History of chest pain   . Mitral valve prolapse    responded to beta-blockers. no evidence of this on 2-d echo/ happened long ago, has not been an issue for years   . Palpitations     Past Surgical History:  Procedure Laterality Date  . arm fracture Left 2017  . CHOLECYSTECTOMY  1997  . COLONOSCOPY  11/02/2007   normal  . LEFT SHOULDER ACROMINOPLASTY Left    cleaned it up    There were no vitals filed for this visit.   Subjective Assessment - 01/08/21 0805    Subjective Patient does not have any current pain but feels more like a pull in the adductor/groin area at times but has not noticed it bothering her as much. She sees the Dr. Delilah Shan friday for a follow up.    Limitations Sitting;Walking;Standing    How long can you sit comfortably? has to adjust constantly to be comfortable    How long can you walk comfortably? 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  No anterior listhesis or severe malalignment. Right hip: Right hip effectively normal-appearing no fractures or severe degenerative changes hip or  pelvis.    Patient Stated Goals get back to walking 3 miles a day without pain    Currently in Pain? No/denies              [facility] PT Assessment - 01/08/21 0001      Strength   Overall Strength Comments Rt knee flexion was 4+/5, Rt knee extension 4+/5, Rt hip flexion 5/5, Rt hip abd 5/5, Rt hip extension 5/5                         [facility] Adult PT Treatment/Exercise - 01/08/21 0001      Knee/Hip Exercises: Stretches   Active Hamstring Stretch 2 reps;30 seconds;Right    Hip Flexor Stretch 2 reps;30 seconds    Piriformis Stretch 2 reps;30 seconds;Right      Knee/Hip Exercises: Aerobic   Nustep L7 x 8 min      Knee/Hip Exercises: Machines for Strengthening   Cybex Knee Extension 2x15 20lbs    Cybex Leg Press shuttle 88lbs 3x10      Knee/Hip Exercises: Standing   Hip Abduction 2 sets;10 reps;Both    Abduction Limitations 4lbs    Hip Extension 2 sets;10 reps;Both    Extension Limitations 4lbs    Lateral Step Up Right;2  sets;10 reps;Step Height: 6";Hand Hold: 2    Step Down Limitations 2x10 with retro step up no UE    Other Standing Knee Exercises hamstring curls 4lbs 2x10; TRX deep squats 2x10      Knee/Hip Exercises: Supine   Bridges with Ball Squeeze Strengthening;Both;2 sets;10 reps   5 sec holf     Manual Therapy   Soft tissue mobilization STM to posterior thigh to address tightness, 8 min                    PT Short Term Goals - 12/25/20 0809      PT SHORT TERM GOAL #1   Title She will be independent with inital HEP    Time 4    Period Weeks    Status On-going    Target Date 01/10/21      PT SHORT TERM GOAL #2   Title She will be able to walk 1 mile with less than 3/10 pain.    Baseline pain limits her from walking currenlty    Status Achieved      PT SHORT TERM GOAL #3   Title -      PT SHORT TERM GOAL #4   Title -      PT SHORT TERM GOAL #5   Title -             PT Long Term Goals - 01/08/21 0906      PT LONG TERM  GOAL #1   Title she will be independent with all HEP issued    Baseline no HEP until today    Time 6    Period Weeks    Status On-going      PT LONG TERM GOAL #2   Title She will be able to walk 3 miles with less than 3/10 pain.    Baseline pain limits her from walking currenlty    Time 6    Period Weeks    Status Achieved      PT LONG TERM GOAL #3   Title She will improve Rt hip strength to 4+ grossly    Baseline 4-, 4    Time 6    Period Weeks    Status Achieved      PT LONG TERM GOAL #4   Title will do FOTO functional outcome measure and will write goal for this once completed    Time 6    Period Weeks    Status New      PT LONG TERM GOAL #5   Title -                 Plan - 01/08/21 9242    Clinical Impression Statement Increased several weights for exercises and patient tolerated well with no limitations in pain or fatigue. Updated strength measurements and she demonstrates improved LE strength across all motions. She is making very good progress toward her LTGs with no significant flare ups in the past couple of weeks. Check in after she follows up wtih Dr. Delilah Shan Friday.    Examination-Activity Limitations Bend;Squat;Stand;Lift;Locomotion Level    Examination-Participation Restrictions Cleaning;Community Activity;Driving;Shop    Stability/Clinical Decision Making Stable/Uncomplicated    Rehab Potential Excellent    PT Frequency 2x / week   1-2   PT Duration 6 weeks    PT Treatment/Interventions ADLs/Self Care Home Management;Aquatic Therapy;Cryotherapy;Electrical Stimulation;Iontophoresis 6m/ml Dexamethasone;Moist Heat;Traction;Ultrasound;Gait training;Therapeutic activities;Therapeutic exercise;Balance training;Neuromuscular re-education;Patient/family education;Manual techniques;Passive range of motion;Dry needling;Taping;Spinal Manipulations;Joint Manipulations    PT Next  Visit Plan update HEP follow up with Dr.?, reassess need for DN prn    PT Home Exercise  Plan Access Code: [email or web address]    Consulted and Agree with Plan of Care Patient           Patient will benefit from skilled therapeutic intervention in order to improve the following deficits and impairments:  Difficulty walking,Decreased activity tolerance,Decreased mobility,Decreased range of motion,Decreased strength,Increased fascial restricitons,Increased muscle spasms,Impaired flexibility,Postural dysfunction,Pain  Visit Diagnosis: Pain in right hip  Difficulty in walking, not elsewhere classified  Muscle weakness (generalized)     Problem List Patient Active Problem List   Diagnosis Date Noted  . Routine general medical examination at a health care facility 04/10/2015  . PALPITATIONS 05/30/2009    Glenetta Hew, SPT 01/08/2021, 9:18 AM   During this treatment session, this physical therapist was present, participating in and directing the treatment.   This note has been reviewed and this clinician agrees with the information provided.    Elsie Ra, PT, DPT 01/08/21 9:48 AM   PHYSICAL THERAPY DISCHARGE SUMMARY  Visits from Start of Care: 6  Current functional level related to goals / functional outcomes: See note   Remaining deficits: See note   Education / Equipment: HEP Plan: Patient agrees to discharge.  Patient goals were partially met. Patient is being discharged due to not returning since the last visit.  ?????    Scot Jun, PT, DPT, OCS, ATC 03/07/21  1:25 PM      [facility] [address] [place], [place], [address] Phone: [phone]   Fax:  [phone]  Name: Kim Anderson MRN: [id number] Date of Birth: 24-Sep-1951

## 2021-01-11 ENCOUNTER — Encounter: Payer: Medicare HMO | Admitting: Rehabilitative and Restorative Service Providers"

## 2021-01-11 NOTE — Progress Notes (Signed)
I, Kim Anderson, LAT, ATC, am serving as scribe for Dr. Clementeen Anderson.  Kim Anderson is a 70 y.o. female who presents to [facility] today for f/u of R post hip/thigh pain.  She was last seen by Dr. Denyse Anderson on 12/05/20 and was referred to PT of which she's completed 6 visits.  She was also prescribed prednisone and gabapentin.  Since her last visit, pt reports increased pain over the last couple days, w/ no explanation. Pain has shifted and is now located in R groin. Pt notes she has not changed her activity level.   Aggravates: sitting, driving Alleviates: standing No radiating pain weakness or numbness distally.  Diagnostic imaging: L-spine and R hip XR- 12/05/20  Pertinent review of systems: No fevers or chills  Relevant historical information: History of palpitations   Exam:  BP 118/78 (BP Location: Right Arm, Patient Position: Sitting, Cuff Size: Normal)   Ht 5\' 6"  (1.676 m)   Wt 186 lb 3.2 oz (84.5 kg)   BMI 30.05 kg/m  General: Well Developed, well nourished, and in no acute distress.   MSK: L-spine normal motion. Right hip normal-appearing Limited internal rotation and flexion with moderate pain. Normal strength without pain. Normal gait. Nontender.    Lab and Radiology Results  DG Lumbar Spine 2-3 Views  Result Date: 12/05/2020 CLINICAL DATA:  Right leg pain. EXAM: LUMBAR SPINE - 2-3 VIEW COMPARISON:  June 30, 2013. FINDINGS: There is no evidence of lumbar spine fracture. Alignment is normal. Intervertebral disc spaces are maintained. IMPRESSION: Negative. Electronically Signed   By: July 02, 2013 M.D.   On: 12/05/2020 14:34   DG HIP UNILAT WITH PELVIS 2-3 VIEWS RIGHT  Result Date: 12/05/2020 CLINICAL DATA:  Pain EXAM: DG HIP (WITH OR WITHOUT PELVIS) 2-3V RIGHT COMPARISON:  None. FINDINGS: Frontal pelvis as well as frontal and lateral right hip images were obtained. No fracture or dislocation. There is slight symmetric narrowing of each  hip joint. No erosive change. Sacroiliac joints appear normal bilaterally. IMPRESSION: Slight symmetric narrowing of each hip joint. No fracture or dislocation. Electronically Signed   By: 02/02/2021 III M.D.   On: 12/05/2020 14:34   I, 02/02/2021, personally (independently) visualized and performed the interpretation of the images attached in this note.   Procedure: Real-time Ultrasound Guided Injection of right anterior hip femoral acetabular joint Device: Philips Affiniti 50G Images permanently stored and available for review in PACS Moderate joint effusion seen on inspection prior to injection Verbal informed consent obtained.  Discussed risks and benefits of procedure. Warned about infection bleeding damage to structures skin hypopigmentation and fat atrophy among others. Patient expresses understanding and agreement Time-out conducted.   Noted no overlying erythema, induration, or other signs of local infection.   Skin prepped in a sterile fashion.   Local anesthesia: Topical Ethyl chloride.   With sterile technique and under real time ultrasound guidance:  40 mg of Kenalog and 2 mL of Marcaine injected into hip joint. Fluid seen entering the joint capsule.   Completed without difficulty   Pain moderately resolved suggesting accurate placement of the medication.   Advised to call if fevers/chills, erythema, induration, drainage, or persistent bleeding.   Images permanently stored and available for review in the ultrasound unit.  Impression: Technically successful ultrasound guided injection.      Assessment and Plan: 70 y.o. female with right groin pain thought to be due to intra-articular hip cause.  Patient does have hip  effusion on exam and did have moderate improvement in pain immediately following injection.  This indicates that a lot of her groin pain is intra-articular.  Alternative diagnosis includes L2 radiculopathy or even hip flexor tendinopathy.  Although these  are less likely.  Patient will let me know how she feels well the hip is numbed up and following the steroid component.   PDMP not reviewed this encounter. Orders Placed This Encounter  Procedures  . [place] LIMITED JOINT SPACE STRUCTURES LOW RIGHT(NO LINKED CHARGES)    Standing Status:   Future    Number of Occurrences:   1    Standing Expiration Date:   07/12/2021    Order Specific Question:   Reason for Exam (SYMPTOM  OR DIAGNOSIS REQUIRED)    Answer:   right hip pain    Order Specific Question:   Preferred imaging location?    Answer:   [facility]   No orders of the defined types were placed in this encounter.    Discussed warning signs or symptoms. Please see discharge instructions. Patient expresses understanding.   The above documentation has been reviewed and is accurate and complete Kim Anderson, M.D.

## 2021-01-12 ENCOUNTER — Ambulatory Visit: Payer: Self-pay

## 2021-01-12 ENCOUNTER — Ambulatory Visit (INDEPENDENT_AMBULATORY_CARE_PROVIDER_SITE_OTHER): Payer: Medicare HMO | Admitting: Family Medicine

## 2021-01-12 ENCOUNTER — Other Ambulatory Visit: Payer: Self-pay

## 2021-01-12 VITALS — BP 118/78 | Ht 66.0 in | Wt 186.2 lb

## 2021-01-12 DIAGNOSIS — M25551 Pain in right hip: Secondary | ICD-10-CM

## 2021-01-12 NOTE — Patient Instructions (Signed)
Thank you for coming in today.  Call or go to the ER if you develop a large red swollen joint with extreme pain or oozing puss.   See how it feels for a few hours today while numb. If it feels better while numb then its probably the hip and we should proceed to MRI arthrogram to evaluate further.   Lets hope this injection provides lasting relief.   Marland Kitchen

## 2021-01-13 ENCOUNTER — Encounter: Payer: Self-pay | Admitting: Family Medicine

## 2021-01-15 ENCOUNTER — Encounter: Payer: Medicare HMO | Admitting: Physical Therapy

## 2021-01-17 ENCOUNTER — Ambulatory Visit: Payer: Medicare HMO | Admitting: Family Medicine

## 2021-01-18 ENCOUNTER — Encounter: Payer: Medicare HMO | Admitting: Rehabilitative and Restorative Service Providers"

## 2021-01-22 ENCOUNTER — Encounter: Payer: Medicare HMO | Admitting: Physical Therapy

## 2021-01-25 ENCOUNTER — Encounter: Payer: Medicare HMO | Admitting: Rehabilitative and Restorative Service Providers"

## 2021-01-29 ENCOUNTER — Encounter: Payer: Medicare HMO | Admitting: Physical Therapy

## 2021-02-01 ENCOUNTER — Encounter: Payer: Medicare HMO | Admitting: Rehabilitative and Restorative Service Providers"

## 2021-02-15 ENCOUNTER — Other Ambulatory Visit: Payer: Self-pay | Admitting: Obstetrics and Gynecology

## 2021-02-15 DIAGNOSIS — Z1231 Encounter for screening mammogram for malignant neoplasm of breast: Secondary | ICD-10-CM

## 2021-03-07 DIAGNOSIS — I2510 Atherosclerotic heart disease of native coronary artery without angina pectoris: Secondary | ICD-10-CM | POA: Diagnosis not present

## 2021-03-07 DIAGNOSIS — Z833 Family history of diabetes mellitus: Secondary | ICD-10-CM | POA: Diagnosis not present

## 2021-03-07 DIAGNOSIS — Z823 Family history of stroke: Secondary | ICD-10-CM | POA: Diagnosis not present

## 2021-03-07 DIAGNOSIS — I252 Old myocardial infarction: Secondary | ICD-10-CM | POA: Diagnosis not present

## 2021-03-07 DIAGNOSIS — I10 Essential (primary) hypertension: Secondary | ICD-10-CM | POA: Diagnosis not present

## 2021-03-07 DIAGNOSIS — Z8349 Family history of other endocrine, nutritional and metabolic diseases: Secondary | ICD-10-CM | POA: Diagnosis not present

## 2021-03-07 DIAGNOSIS — Z6828 Body mass index (BMI) 28.0-28.9, adult: Secondary | ICD-10-CM | POA: Diagnosis not present

## 2021-03-07 DIAGNOSIS — Z807 Family history of other malignant neoplasms of lymphoid, hematopoietic and related tissues: Secondary | ICD-10-CM | POA: Diagnosis not present

## 2021-03-07 DIAGNOSIS — E663 Overweight: Secondary | ICD-10-CM | POA: Diagnosis not present

## 2021-03-07 DIAGNOSIS — Z7982 Long term (current) use of aspirin: Secondary | ICD-10-CM | POA: Diagnosis not present

## 2021-04-10 ENCOUNTER — Ambulatory Visit: Payer: Medicare HMO

## 2021-04-11 ENCOUNTER — Other Ambulatory Visit: Payer: Self-pay

## 2021-04-11 ENCOUNTER — Ambulatory Visit
Admission: RE | Admit: 2021-04-11 | Discharge: 2021-04-11 | Disposition: A | Discharge status: Home / Self Care | Payer: Medicare HMO | Source: Ambulatory Visit | Attending: Obstetrics and Gynecology | Admitting: Obstetrics and Gynecology

## 2021-04-11 DIAGNOSIS — Z1231 Encounter for screening mammogram for malignant neoplasm of breast: Secondary | ICD-10-CM

## 2021-05-12 ENCOUNTER — Other Ambulatory Visit: Payer: Self-pay | Admitting: Internal Medicine

## 2021-05-14 NOTE — Telephone Encounter (Signed)
Pt last OV 04/24/20.   Pt notified that appt will be needed for additional refills after a 3 months. Pt states she is going out of town for 8 weeks & has enough supply for 90 days.  Appt made for 07/30/21, will decline request based on pt statement.

## 2021-07-29 ENCOUNTER — Other Ambulatory Visit: Payer: Self-pay

## 2021-07-30 ENCOUNTER — Encounter: Payer: Self-pay | Admitting: Internal Medicine

## 2021-07-30 ENCOUNTER — Ambulatory Visit (INDEPENDENT_AMBULATORY_CARE_PROVIDER_SITE_OTHER): Payer: Medicare HMO | Admitting: Internal Medicine

## 2021-07-30 VITALS — BP 122/70 | HR 65 | Temp 98.2°F | Resp 18 | Ht 66.0 in | Wt 167.2 lb

## 2021-07-30 DIAGNOSIS — Z0000 Encounter for general adult medical examination without abnormal findings: Secondary | ICD-10-CM

## 2021-07-30 DIAGNOSIS — Z1211 Encounter for screening for malignant neoplasm of colon: Secondary | ICD-10-CM

## 2021-07-30 DIAGNOSIS — R002 Palpitations: Secondary | ICD-10-CM

## 2021-07-30 LAB — CBC
HCT: 42.9 % (ref 36.0–46.0)
Hemoglobin: 14.6 g/dL (ref 12.0–15.0)
MCHC: 34.1 g/dL (ref 30.0–36.0)
MCV: 90.9 fl (ref 78.0–100.0)
Platelets: 227 10*3/uL (ref 150.0–400.0)
RBC: 4.72 Mil/uL (ref 3.87–5.11)
RDW: 13 % (ref 11.5–15.5)
WBC: 5.3 10*3/uL (ref 4.0–10.5)

## 2021-07-30 LAB — COMPREHENSIVE METABOLIC PANEL
ALT: 13 U/L (ref 0–35)
AST: 17 U/L (ref 0–37)
Albumin: 4.4 g/dL (ref 3.5–5.2)
Alkaline Phosphatase: 58 U/L (ref 39–117)
BUN: 12 mg/dL (ref 6–23)
CO2: 27 mEq/L (ref 19–32)
Calcium: 9.8 mg/dL (ref 8.4–10.5)
Chloride: 102 mEq/L (ref 96–112)
Creatinine, Ser: 0.85 mg/dL (ref 0.40–1.20)
GFR: 70.45 mL/min (ref >60.00)
Glucose, Bld: 91 mg/dL (ref 70–99)
Potassium: 4.8 mEq/L (ref 3.5–5.1)
Sodium: 137 mEq/L (ref 135–145)
Total Bilirubin: 1.1 mg/dL (ref 0.2–1.2)
Total Protein: 7.4 g/dL (ref 6.0–8.3)

## 2021-07-30 LAB — LIPID PANEL
Cholesterol: 198 mg/dL (ref 0–200)
HDL: 72.1 mg/dL (ref >39.00)
LDL Cholesterol: 114 mg/dL — ABNORMAL HIGH (ref 0–99)
NonHDL: 125.61
Total CHOL/HDL Ratio: 3
Triglycerides: 58 mg/dL (ref 0.0–149.0)
VLDL: 11.6 mg/dL (ref 0.0–40.0)

## 2021-07-30 MED ORDER — METOPROLOL SUCCINATE ER 25 MG PO TB24: 25.0000 mg | ORAL_TABLET | Freq: Every day | ORAL | 3 refills | Status: DC

## 2021-07-30 NOTE — Progress Notes (Signed)
   Subjective:   Patient ID: Kim Anderson, female    DOB: 13-Dec-1950, 70 y.o.   MRN: [id number]  HPI The patient is a 70 YO female coming in for physical.   PMH, FMH, social history reviewed and updated  Review of Systems  Constitutional: Negative.   HENT: Negative.    Eyes: Negative.   Respiratory:  Negative for cough, chest tightness and shortness of breath.   Cardiovascular:  Negative for chest pain, palpitations and leg swelling.  Gastrointestinal:  Negative for abdominal distention, abdominal pain, constipation, diarrhea, nausea and vomiting.  Musculoskeletal: Negative.   Skin: Negative.   Neurological: Negative.   Psychiatric/Behavioral: Negative.     Objective:  Physical Exam Constitutional:      Appearance: She is well-developed.  HENT:     Head: Normocephalic and atraumatic.  Cardiovascular:     Rate and Rhythm: Normal rate and regular rhythm.  Pulmonary:     Effort: Pulmonary effort is normal. No respiratory distress.     Breath sounds: Normal breath sounds. No wheezing or rales.  Abdominal:     General: Bowel sounds are normal. There is no distension.     Palpations: Abdomen is soft.     Tenderness: There is no abdominal tenderness. There is no rebound.  Musculoskeletal:     Cervical back: Normal range of motion.  Skin:    General: Skin is warm and dry.  Neurological:     Mental Status: She is alert and oriented to person, place, and time.     Coordination: Coordination normal.    Vitals:   07/30/21 0945  BP: 122/70  Pulse: 65  Resp: 18  Temp: 98.2 F (36.8 C)  TempSrc: Oral  SpO2: 98%  Weight: 167 lb 3.2 oz (75.8 kg)  Height: 5\' 6"  (1.676 m)    This visit occurred during the SARS-CoV-2 public health emergency.  Safety protocols were in place, including screening questions prior to the visit, additional usage of staff PPE, and extensive cleaning of exam room while observing appropriate contact time as indicated for disinfecting solutions.    Assessment & Plan:

## 2021-07-30 NOTE — Patient Instructions (Addendum)
We will check the labs and get the colon cancer screening to the house.

## 2021-07-31 NOTE — Assessment & Plan Note (Signed)
Refill metoprolol. Checking CBC and CMP and lipid panel. HR at goal today and palpitations controlled so will continue.

## 2021-07-31 NOTE — Assessment & Plan Note (Signed)
Flu shot yearly. Covid-19 up to date. Pneumonia up to date. Shingrix counseled to get at pharmacy. Tetanus up to date. Cologuard ordered. Mammogram up to date, pap smear aged out and dexa up to date. Counseled about sun safety and mole surveillance. Counseled about the dangers of distracted driving. Given 10 year screening recommendations.

## 2021-08-01 DIAGNOSIS — H52223 Regular astigmatism, bilateral: Secondary | ICD-10-CM | POA: Diagnosis not present

## 2021-08-07 DIAGNOSIS — Z1211 Encounter for screening for malignant neoplasm of colon: Secondary | ICD-10-CM | POA: Diagnosis not present

## 2021-08-10 LAB — COLOGUARD: Cologuard: NEGATIVE

## 2021-08-13 DIAGNOSIS — H524 Presbyopia: Secondary | ICD-10-CM | POA: Diagnosis not present

## 2021-08-13 DIAGNOSIS — H52223 Regular astigmatism, bilateral: Secondary | ICD-10-CM | POA: Diagnosis not present

## 2021-10-31 ENCOUNTER — Encounter: Payer: Self-pay | Admitting: Internal Medicine

## 2021-12-18 DIAGNOSIS — Z01411 Encounter for gynecological examination (general) (routine) with abnormal findings: Secondary | ICD-10-CM | POA: Diagnosis not present

## 2021-12-18 DIAGNOSIS — Z6827 Body mass index (BMI) 27.0-27.9, adult: Secondary | ICD-10-CM | POA: Diagnosis not present

## 2021-12-18 DIAGNOSIS — Z01419 Encounter for gynecological examination (general) (routine) without abnormal findings: Secondary | ICD-10-CM | POA: Diagnosis not present

## 2021-12-18 DIAGNOSIS — M8580 Other specified disorders of bone density and structure, unspecified site: Secondary | ICD-10-CM | POA: Diagnosis not present

## 2021-12-18 DIAGNOSIS — Z124 Encounter for screening for malignant neoplasm of cervix: Secondary | ICD-10-CM | POA: Diagnosis not present

## 2021-12-18 DIAGNOSIS — R0789 Other chest pain: Secondary | ICD-10-CM | POA: Diagnosis not present

## 2021-12-26 ENCOUNTER — Other Ambulatory Visit: Payer: Self-pay | Admitting: Obstetrics and Gynecology

## 2021-12-26 DIAGNOSIS — M8580 Other specified disorders of bone density and structure, unspecified site: Secondary | ICD-10-CM

## 2021-12-31 ENCOUNTER — Ambulatory Visit (INDEPENDENT_AMBULATORY_CARE_PROVIDER_SITE_OTHER): Payer: Medicare HMO

## 2021-12-31 ENCOUNTER — Other Ambulatory Visit: Payer: Self-pay

## 2021-12-31 VITALS — BP 120/70 | HR 62 | Temp 98.2°F | Resp 16 | Ht 66.0 in | Wt 169.0 lb

## 2021-12-31 DIAGNOSIS — Z0000 Encounter for general adult medical examination without abnormal findings: Secondary | ICD-10-CM

## 2021-12-31 NOTE — Patient Instructions (Signed)
[name] , Thank you for taking time to come for your Medicare Wellness Visit. I appreciate your ongoing commitment to your health goals. Please review the following plan we discussed and let me know if I can assist you in the future.   Screening recommendations/referrals: Colonoscopy: 08/10/2021; due every 3 years (Cologuard) Mammogram: 04/11/2021; due every 1-2 years Bone Density: ordered 12/26/2021; last done 02/07/2020; due every 2 years Recommended yearly ophthalmology/optometry visit for glaucoma screening and checkup Recommended yearly dental visit for hygiene and checkup  Vaccinations: Influenza vaccine: 09/06/2021 Pneumococcal vaccine: 11/05/2018, 10/31/2021 Tdap vaccine: 11/05/2018; due every 10 years Shingles vaccine: 07/31/2021, 10/31/2021   Covid-19: 01/14/2020, 02/06/2020, 10/16/2020, 05/07/2021, 09/06/2021  Advanced directives: Please bring a copy of your health care power of attorney and living will to the office at your convenience.  Conditions/risks identified: Yes; Client understands the importance of follow-up with providers by attending scheduled visits and discussed goals to eat healthier, increase physical activity, exercise the brain, socialize more, get enough sleep and make time for laughter.  Next appointment: Please schedule your next Medicare Wellness Visit with your Nurse Health Advisor in 1 year by calling [phone].   Preventive Care 12 Years and Older, Female Preventive care refers to lifestyle choices and visits with your health care provider that can promote health and wellness. What does preventive care include? A yearly physical exam. This is also called an annual well check. Dental exams once or twice a year. Routine eye exams. Ask your health care provider how often you should have your eyes checked. Personal lifestyle choices, including: Daily care of your teeth and gums. Regular physical activity. Eating a healthy diet. Avoiding tobacco and drug  use. Limiting alcohol use. Practicing safe sex. Taking low-dose aspirin every day. Taking vitamin and mineral supplements as recommended by your health care provider. What happens during an annual well check? The services and screenings done by your health care provider during your annual well check will depend on your age, overall health, lifestyle risk factors, and family history of disease. Counseling  Your health care provider may ask you questions about your: Alcohol use. Tobacco use. Drug use. Emotional well-being. Home and relationship well-being. Sexual activity. Eating habits. History of falls. Memory and ability to understand (cognition). Work and work Statistician. Reproductive health. Screening  You may have the following tests or measurements: Height, weight, and BMI. Blood pressure. Lipid and cholesterol levels. These may be checked every 5 years, or more frequently if you are over 55 years old. Skin check. Lung cancer screening. You may have this screening every year starting at age 7 if you have a 30-pack-year history of smoking and currently smoke or have quit within the past 15 years. Fecal occult blood test (FOBT) of the stool. You may have this test every year starting at age 73. Flexible sigmoidoscopy or colonoscopy. You may have a sigmoidoscopy every 5 years or a colonoscopy every 10 years starting at age 74. Hepatitis C blood test. Hepatitis B blood test. Sexually transmitted disease (STD) testing. Diabetes screening. This is done by checking your blood sugar (glucose) after you have not eaten for a while (fasting). You may have this done every 1-3 years. Bone density scan. This is done to screen for osteoporosis. You may have this done starting at age 6. Mammogram. This may be done every 1-2 years. Talk to your health care provider about how often you should have regular mammograms. Talk with your health care provider about your test results, treatment  options, and if necessary, the need for more tests. Vaccines  Your health care provider may recommend certain vaccines, such as: Influenza vaccine. This is recommended every year. Tetanus, diphtheria, and acellular pertussis (Tdap, Td) vaccine. You may need a Td booster every 10 years. Zoster vaccine. You may need this after age 68. Pneumococcal 13-valent conjugate (PCV13) vaccine. One dose is recommended after age 74. Pneumococcal polysaccharide (PPSV23) vaccine. One dose is recommended after age 30. Talk to your health care provider about which screenings and vaccines you need and how often you need them. This information is not intended to replace advice given to you by your health care provider. Make sure you discuss any questions you have with your health care provider. Document Released: 12/15/2015 Document Revised: 08/07/2016 Document Reviewed: 09/19/2015 Elsevier Interactive Patient Education  2017 [employer] Prevention in the Home Falls can cause injuries. They can happen to people of all ages. There are many things you can do to make your home safe and to help prevent falls. What can I do on the outside of my home? Regularly fix the edges of walkways and driveways and fix any cracks. Remove anything that might make you trip as you walk through a door, such as a raised step or threshold. Trim any bushes or trees on the path to your home. Use bright outdoor lighting. Clear any walking paths of anything that might make someone trip, such as rocks or tools. Regularly check to see if handrails are loose or broken. Make sure that both sides of any steps have handrails. Any raised decks and porches should have guardrails on the edges. Have any leaves, snow, or ice cleared regularly. Use sand or salt on walking paths during winter. Clean up any spills in your garage right away. This includes oil or grease spills. What can I do in the bathroom? Use night lights. Install grab  bars by the toilet and in the tub and shower. Do not use towel bars as grab bars. Use non-skid mats or decals in the tub or shower. If you need to sit down in the shower, use a plastic, non-slip stool. Keep the floor dry. Clean up any water that spills on the floor as soon as it happens. Remove soap buildup in the tub or shower regularly. Attach bath mats securely with double-sided non-slip rug tape. Do not have throw rugs and other things on the floor that can make you trip. What can I do in the bedroom? Use night lights. Make sure that you have a light by your bed that is easy to reach. Do not use any sheets or blankets that are too big for your bed. They should not hang down onto the floor. Have a firm chair that has side arms. You can use this for support while you get dressed. Do not have throw rugs and other things on the floor that can make you trip. What can I do in the kitchen? Clean up any spills right away. Avoid walking on wet floors. Keep items that you use a lot in easy-to-reach places. If you need to reach something above you, use a strong step stool that has a grab bar. Keep electrical cords out of the way. Do not use floor polish or wax that makes floors slippery. If you must use wax, use non-skid floor wax. Do not have throw rugs and other things on the floor that can make you trip. What can I do with my stairs? Do not leave  any items on the stairs. Make sure that there are handrails on both sides of the stairs and use them. Fix handrails that are broken or loose. Make sure that handrails are as long as the stairways. Check any carpeting to make sure that it is firmly attached to the stairs. Fix any carpet that is loose or worn. Avoid having throw rugs at the top or bottom of the stairs. If you do have throw rugs, attach them to the floor with carpet tape. Make sure that you have a light switch at the top of the stairs and the bottom of the stairs. If you do not have them,  ask someone to add them for you. What else can I do to help prevent falls? Wear shoes that: Do not have high heels. Have rubber bottoms. Are comfortable and fit you well. Are closed at the toe. Do not wear sandals. If you use a stepladder: Make sure that it is fully opened. Do not climb a closed stepladder. Make sure that both sides of the stepladder are locked into place. Ask someone to hold it for you, if possible. Clearly mark and make sure that you can see: Any grab bars or handrails. First and last steps. Where the edge of each step is. Use tools that help you move around (mobility aids) if they are needed. These include: Canes. Walkers. Scooters. Crutches. Turn on the lights when you go into a dark area. Replace any light bulbs as soon as they burn out. Set up your furniture so you have a clear path. Avoid moving your furniture around. If any of your floors are uneven, fix them. If there are any pets around you, be aware of where they are. Review your medicines with your doctor. Some medicines can make you feel dizzy. This can increase your chance of falling. Ask your doctor what other things that you can do to help prevent falls. This information is not intended to replace advice given to you by your health care provider. Make sure you discuss any questions you have with your health care provider. Document Released: 09/14/2009 Document Revised: 04/25/2016 Document Reviewed: 12/23/2014 Elsevier Interactive Patient Education  2017 [employer].

## 2021-12-31 NOTE — Progress Notes (Signed)
Subjective:   Kim Anderson is a 71 y.o. female who presents for Medicare Annual (Subsequent) preventive examination.  Review of Systems     Cardiac Risk Factors include: advanced age (>42mn, >>34women);hypertension;family history of premature cardiovascular disease     Objective:    Today's Vitals   12/31/21 1121  BP: 120/70  Pulse: 62  Resp: 16  Temp: 98.2 F (36.8 C)  SpO2: 98%  Weight: 169 lb (76.7 kg)  Height: '5\' 6"'  (1.676 m)  PainSc: 0-No pain   Body mass index is 27.28 kg/m.  Advanced Directives 12/31/2021 11/20/2020 11/05/2019 07/31/2016 06/14/2016 04/13/2015  Does Patient Have a Medical Advance Directive? Yes Yes Yes Yes No Yes  Type of Advance Directive Living will;Healthcare [place] - - -  Does patient want to make changes to medical advance directive? No - Patient declined No - Patient declined - - - -  Copy of [place]? No - copy requested No - copy requested No - copy requested No - copy requested - -    Current Medications (verified) Outpatient Encounter Medications as of 12/31/2021  Medication Sig   aspirin (ASPIR-LOW) 81 MG EC tablet Take 1 tablet (81 mg total) by mouth daily. Swallow whole.   B Complex Vitamins (VITAMIN B COMPLEX) TABS TAKE 1 TABLET BY MOUTH DAILY.   Calcium Carbonate-Vitamin D 600-400 MG-UNIT tablet TAKE 1 TABLET BY MOUTH ONCE DAILY   metoprolol succinate (TOPROL-XL) 25 MG 24 hr tablet Take 1 tablet (25 mg total) by mouth daily.   [DISCONTINUED] Omega-3 Fatty Acids (FISH OIL) 1000 MG CAPS TAKE 1 CAPSULE BY MOUTH DAILY.   No facility-administered encounter medications on file as of 12/31/2021.    Allergies (verified) Amoxicillin, Ampicillin, Cephalexin, Meperidine hcl, Ofloxacin, and Sulfamethoxazole-trimethoprim   History: Past Medical History:  Diagnosis Date   History of chest pain    Mitral valve prolapse     responded to beta-blockers. no evidence of this on 2-d echo/ happened long ago, has not been an issue for years    Palpitations    Past Surgical History:  Procedure Laterality Date   arm fracture Left 2017   CHOLECYSTECTOMY  1997   COLONOSCOPY  11/02/2007   normal   LEFT SHOULDER ACROMINOPLASTY Left    cleaned it up   Family History  Problem Relation Age of Onset   Diabetes Other        parent   Hypertension Other        parent   Hemochromatosis Brother    Diabetes Mother    Cancer Mother    Multiple myeloma Mother    Social History   Socioeconomic History   Marital status: Married    Spouse name: Not on file   Number of children: 4   Years of education: Not on file   Highest education level: Not on file  Occupational History   Occupation: RProgrammer, multimedia [place]    Comment: Working on [employer] Tobacco Use   Smoking status: Never   Smokeless tobacco: Never  Vaping Use   Vaping Use: Never used  Substance and Sexual Activity   Alcohol use: No   Drug use: No   Sexual activity: Not Currently  Other Topics Concern   Not on file  Social History Narrative   Lives with spouse   Social Determinants of Health   Financial Resource Strain: Low Risk  Difficulty of Paying Living Expenses: Not hard at all  Food Insecurity: No Food Insecurity   Worried About [place] in the Last Year: Never true   Ran Out of Food in the Last Year: Never true  Transportation Needs: No Transportation Needs   Lack of Transportation (Medical): No   Lack of Transportation (Non-Medical): No  Physical Activity: Sufficiently Active   Days of Exercise per Week: 5 days   Minutes of Exercise per Session: 30 min  Stress: No Stress Concern Present   Feeling of Stress : Not at all  Social Connections: Socially Integrated   Frequency of Communication with Friends and Family: More than three times a week   Frequency of Social Gatherings with Friends and Family: More than three  times a week   Attends Religious Services: More than 4 times per year   Active Member of Kim Anderson or Organizations: Yes   Attends Music therapist: More than 4 times per year   Marital Status: Married    Tobacco Counseling Counseling given: Not Answered   Clinical Intake:  Pre-visit preparation completed: Yes  Pain : No/denies pain Pain Score: 0-No pain     BMI - recorded: 27.28 Nutritional Status: BMI 25 -29 Overweight Nutritional Risks: None Diabetes: No  How often do you need to have someone help you when you read instructions, pamphlets, or other written materials from your doctor or pharmacy?: 1 - Never What is the last grade level you completed in school?: Master Degree in Nursing  Diabetic? no  Interpreter Needed?: No  Information entered by :: Lisette Abu, LPN   Activities of Daily Living In your present state of health, do you have any difficulty performing the following activities: 12/31/2021  Hearing? N  Vision? N  Difficulty concentrating or making decisions? N  Walking or climbing stairs? N  Dressing or bathing? N  Doing errands, shopping? N  Preparing Food and eating ? N  Using the Toilet? N  In the past six months, have you accidently leaked urine? N  Do you have problems with loss of bowel control? N  Managing your Medications? N  Managing your Finances? N  Housekeeping or managing your Housekeeping? N  Some recent data might be hidden    Patient Care Team: Hoyt Koch, MD as PCP - General (Internal Medicine) Verl Blalock, Marijo Conception, MD (Inactive) (Cardiology) Erline Levine, MD (Neurosurgery) Brien Few, MD as Consulting Physician (Obstetrics and Gynecology)  Indicate any recent Medical Services you may have received from other than [facility] providers in the past year (date may be approximate).     Assessment:   This is a routine wellness examination for Kim Anderson.  Hearing/Vision screen Hearing Screening - Comments::  Patient denied any hearing difficulty.   No hearing aids.  Vision Screening - Comments:: Patient wears corrective glasses/contacts.  Eye exam done annually by: Gala Romney, MD.  Dietary issues and exercise activities discussed: Current Exercise Habits: Home exercise routine;Structured exercise class, Type of exercise: walking;stretching;strength training/weights;Other - see comments (hiking), Time (Minutes): 30, Frequency (Times/Week): 5, Weekly Exercise (Minutes/Week): 150, Intensity: Moderate, Exercise limited by: None identified   Goals Addressed               This Visit's Progress     Patient Stated (pt-stated)        Patient declined health goal at this time.      Depression Screen PHQ 2/9 Scores 12/31/2021 11/20/2020 11/05/2019 11/05/2018 05/01/2017 04/13/2015 12/08/2013  PHQ - 2  Score 0 0 0 0 0 0 0  PHQ- 9 Score - - - - 1 - -    Fall Risk Fall Risk  12/31/2021 11/20/2020 11/05/2019 11/05/2018 04/13/2015  Falls in the past year? 0 0 0 0 No  Number falls in past yr: 0 0 0 - -  Injury with Fall? 0 0 0 - -  Risk for fall due to : No Fall Risks No Fall Risks - - -  Follow up Falls evaluation completed Falls evaluation completed - - -    FALL RISK PREVENTION PERTAINING TO THE HOME:  Any stairs in or around the home? No  If so, are there any without handrails? No  Home free of loose throw rugs in walkways, pet beds, electrical cords, etc? Yes  Adequate lighting in your home to reduce risk of falls? Yes   ASSISTIVE DEVICES UTILIZED TO PREVENT FALLS:  Life alert? No  Use of a cane, walker or w/c? No  Grab bars in the bathroom? No  Shower chair or bench in shower? No  Elevated toilet seat or a handicapped toilet? Yes   TIMED UP AND GO:  Was the test performed? Yes .  Length of time to ambulate 10 feet: 6 sec.   Gait steady and fast without use of assistive device  Cognitive Function: Normal cognitive status assessed by direct observation by this Nurse Health Advisor. No  abnormalities found.          Immunizations Immunization History  Administered Date(s) Administered   Fluad Quad(high Dose 65+) 08/12/2019, 09/06/2021   Influenza Whole 10/18/2010   Influenza, High Dose Seasonal PF 07/28/2019   Influenza,inj,Quad PF,6+ Mos 09/01/2013   Influenza-Unspecified 08/10/2014, 08/18/2017, 08/29/2018   PFIZER(Purple Top)SARS-COV-2 Vaccination 01/14/2020, 02/06/2020, 10/16/2020, 05/07/2021   PNEUMOCOCCAL CONJUGATE-20 10/31/2021   Pfizer Covid-19 Vaccine Bivalent Booster 70yr & up 09/06/2021   Pneumococcal Conjugate-13 11/05/2018   Tdap 11/05/2018   Zoster Recombinat (Shingrix) 07/31/2021, 10/31/2021   Zoster, Live 04/10/2015    TDAP status: Up to date  Flu Vaccine status: Up to date  Pneumococcal vaccine status: Up to date  Covid-19 vaccine status: Completed vaccines  Qualifies for Shingles Vaccine? Yes   Zostavax completed Yes   Shingrix Completed?: Yes  Screening Tests Health Maintenance  Topic Date Due   MAMMOGRAM  04/12/2023   Fecal DNA (Cologuard)  08/07/2024   TETANUS/TDAP  11/05/2028   Pneumonia Vaccine 69 Years old  Completed   INFLUENZA VACCINE  Completed   DEXA SCAN  Completed   COVID-19 Vaccine  Completed   Hepatitis C Screening  Completed   Zoster Vaccines- Shingrix  Completed   HPV VACCINES  Aged Out    Health Maintenance  There are no preventive care reminders to display for this patient.  Colorectal cancer screening: Type of screening: Cologuard. Completed 08/10/2021. Repeat every 3 years  Mammogram status: Completed 04/11/2021. Repeat every year  Bone Density status: Completed 02/07/2020. Results reflect: Bone density results: OSTEOPENIA. Repeat every 2-3 years.  Lung Cancer Screening: (Low Dose CT Chest recommended if Age 69-80years, 30 pack-year currently smoking OR have quit w/in 15years.) does not qualify.   Lung Cancer Screening Referral: no  Additional Screening:  Hepatitis C Screening: does qualify;  Completed yes  Vision Screening: Recommended annual ophthalmology exams for early detection of glaucoma and other disorders of the eye. Is the patient up to date with their annual eye exam?  Yes  Who is the provider or what is the name of the office  in which the patient attends annual eye exams? Gala Romney, MD If pt is not established with a provider, would they like to be referred to a provider to establish care? No .   Dental Screening: Recommended annual dental exams for proper oral hygiene  Community Resource Referral / Chronic Care Management: CRR required this visit?  No   CCM required this visit?  No      Plan:     I have personally reviewed and noted the following in the patients chart:   Medical and social history Use of alcohol, tobacco or illicit drugs  Current medications and supplements including opioid prescriptions.  Functional ability and status Nutritional status Physical activity Advanced directives List of other physicians Hospitalizations, surgeries, and ER visits in previous 12 months Vitals Screenings to include cognitive, depression, and falls Referrals and appointments  In addition, I have reviewed and discussed with patient certain preventive protocols, quality metrics, and best practice recommendations. A written personalized care plan for preventive services as well as general preventive health recommendations were provided to patient.     Sheral Flow, LPN   1/77/1165   Nurse Notes:  Patient is cogitatively intact. There were no vitals filed for this visit. There is no height or weight on file to calculate BMI. Patient stated that she has no issues with gait or balance; does not use any assistive devices.

## 2022-02-27 ENCOUNTER — Other Ambulatory Visit: Payer: Self-pay | Admitting: Obstetrics and Gynecology

## 2022-02-27 ENCOUNTER — Other Ambulatory Visit: Payer: Self-pay | Admitting: General Surgery

## 2022-02-27 DIAGNOSIS — Z1231 Encounter for screening mammogram for malignant neoplasm of breast: Secondary | ICD-10-CM

## 2022-04-22 ENCOUNTER — Ambulatory Visit
Admission: RE | Admit: 2022-04-22 | Discharge: 2022-04-22 | Disposition: A | Discharge status: Home / Self Care | Payer: Medicare HMO | Source: Ambulatory Visit | Attending: General Surgery | Admitting: General Surgery

## 2022-04-22 ENCOUNTER — Encounter: Payer: Self-pay | Admitting: Internal Medicine

## 2022-04-22 DIAGNOSIS — Z1231 Encounter for screening mammogram for malignant neoplasm of breast: Secondary | ICD-10-CM

## 2022-05-06 DIAGNOSIS — Z880 Allergy status to penicillin: Secondary | ICD-10-CM | POA: Diagnosis not present

## 2022-05-06 DIAGNOSIS — I10 Essential (primary) hypertension: Secondary | ICD-10-CM | POA: Diagnosis not present

## 2022-05-06 DIAGNOSIS — Z008 Encounter for other general examination: Secondary | ICD-10-CM | POA: Diagnosis not present

## 2022-05-06 DIAGNOSIS — Z91038 Other insect allergy status: Secondary | ICD-10-CM | POA: Diagnosis not present

## 2022-05-06 DIAGNOSIS — Z87892 Personal history of anaphylaxis: Secondary | ICD-10-CM | POA: Diagnosis not present

## 2022-05-06 DIAGNOSIS — Z833 Family history of diabetes mellitus: Secondary | ICD-10-CM | POA: Diagnosis not present

## 2022-05-06 DIAGNOSIS — Z809 Family history of malignant neoplasm, unspecified: Secondary | ICD-10-CM | POA: Diagnosis not present

## 2022-05-06 DIAGNOSIS — Z7982 Long term (current) use of aspirin: Secondary | ICD-10-CM | POA: Diagnosis not present

## 2022-07-19 ENCOUNTER — Other Ambulatory Visit: Payer: Self-pay | Admitting: Internal Medicine

## 2022-07-24 ENCOUNTER — Ambulatory Visit
Admission: RE | Admit: 2022-07-24 | Discharge: 2022-07-24 | Disposition: A | Discharge status: Home / Self Care | Payer: Medicare HMO | Source: Ambulatory Visit | Attending: Obstetrics and Gynecology | Admitting: Obstetrics and Gynecology

## 2022-07-24 DIAGNOSIS — Z780 Asymptomatic menopausal state: Secondary | ICD-10-CM | POA: Diagnosis not present

## 2022-07-24 DIAGNOSIS — M8589 Other specified disorders of bone density and structure, multiple sites: Secondary | ICD-10-CM | POA: Diagnosis not present

## 2022-07-24 DIAGNOSIS — M810 Age-related osteoporosis without current pathological fracture: Secondary | ICD-10-CM | POA: Diagnosis not present

## 2022-07-24 DIAGNOSIS — M8580 Other specified disorders of bone density and structure, unspecified site: Secondary | ICD-10-CM

## 2022-08-07 DIAGNOSIS — H52223 Regular astigmatism, bilateral: Secondary | ICD-10-CM | POA: Diagnosis not present

## 2022-08-07 DIAGNOSIS — H524 Presbyopia: Secondary | ICD-10-CM | POA: Diagnosis not present

## 2022-08-19 ENCOUNTER — Ambulatory Visit (INDEPENDENT_AMBULATORY_CARE_PROVIDER_SITE_OTHER): Payer: Medicare HMO | Admitting: Internal Medicine

## 2022-08-19 ENCOUNTER — Encounter: Payer: Self-pay | Admitting: Internal Medicine

## 2022-08-19 VITALS — BP 132/86 | HR 60 | Ht 65.5 in | Wt 162.0 lb

## 2022-08-19 DIAGNOSIS — Z136 Encounter for screening for cardiovascular disorders: Secondary | ICD-10-CM

## 2022-08-19 DIAGNOSIS — Z23 Encounter for immunization: Secondary | ICD-10-CM | POA: Diagnosis not present

## 2022-08-19 DIAGNOSIS — R002 Palpitations: Secondary | ICD-10-CM | POA: Diagnosis not present

## 2022-08-19 DIAGNOSIS — Z0000 Encounter for general adult medical examination without abnormal findings: Secondary | ICD-10-CM | POA: Diagnosis not present

## 2022-08-19 LAB — LIPID PANEL
Cholesterol: 207 mg/dL — ABNORMAL HIGH (ref 0–200)
HDL: 83.4 mg/dL (ref >39.00)
LDL Cholesterol: 109 mg/dL — ABNORMAL HIGH (ref 0–99)
NonHDL: 123.5
Total CHOL/HDL Ratio: 2
Triglycerides: 73 mg/dL (ref 0.0–149.0)
VLDL: 14.6 mg/dL (ref 0.0–40.0)

## 2022-08-19 LAB — COMPREHENSIVE METABOLIC PANEL
ALT: 11 U/L (ref 0–35)
AST: 15 U/L (ref 0–37)
Albumin: 4.4 g/dL (ref 3.5–5.2)
Alkaline Phosphatase: 64 U/L (ref 39–117)
BUN: 10 mg/dL (ref 6–23)
CO2: 27 mEq/L (ref 19–32)
Calcium: 9.6 mg/dL (ref 8.4–10.5)
Chloride: 102 mEq/L (ref 96–112)
Creatinine, Ser: 0.86 mg/dL (ref 0.40–1.20)
GFR: 68.96 mL/min (ref >60.00)
Glucose, Bld: 97 mg/dL (ref 70–99)
Potassium: 4.7 mEq/L (ref 3.5–5.1)
Sodium: 137 mEq/L (ref 135–145)
Total Bilirubin: 1 mg/dL (ref 0.2–1.2)
Total Protein: 7.5 g/dL (ref 6.0–8.3)

## 2022-08-19 LAB — CBC
HCT: 44.1 % (ref 36.0–46.0)
Hemoglobin: 15.1 g/dL — ABNORMAL HIGH (ref 12.0–15.0)
MCHC: 34.4 g/dL (ref 30.0–36.0)
MCV: 91.7 fl (ref 78.0–100.0)
Platelets: 218 10*3/uL (ref 150.0–400.0)
RBC: 4.8 Mil/uL (ref 3.87–5.11)
RDW: 12.7 % (ref 11.5–15.5)
WBC: 5.9 10*3/uL (ref 4.0–10.5)

## 2022-08-19 MED ORDER — METOPROLOL SUCCINATE ER 25 MG PO TB24: 25.0000 mg | ORAL_TABLET | Freq: Every day | ORAL | 3 refills | Status: DC

## 2022-08-19 NOTE — Assessment & Plan Note (Signed)
Controlled on metoprolol 25 mg daily. Refilled for 1 year.

## 2022-08-19 NOTE — Patient Instructions (Signed)
We have given you the flu shot.   

## 2022-08-19 NOTE — Progress Notes (Signed)
   Subjective:   Patient ID: Kim Anderson, female    DOB: 20-Mar-1951, 71 y.o.   MRN: [id number]  HPI The patient is here for physical.  PMH, [facility], social history reviewed and updated  Review of Systems  Constitutional: Negative.   HENT: Negative.    Eyes: Negative.   Respiratory:  Negative for cough, chest tightness and shortness of breath.   Cardiovascular:  Negative for chest pain, palpitations and leg swelling.  Gastrointestinal:  Negative for abdominal distention, abdominal pain, constipation, diarrhea, nausea and vomiting.  Musculoskeletal: Negative.   Skin: Negative.   Neurological: Negative.   Psychiatric/Behavioral: Negative.      Objective:  Physical Exam Constitutional:      Appearance: She is well-developed.  HENT:     Head: Normocephalic and atraumatic.  Cardiovascular:     Rate and Rhythm: Normal rate and regular rhythm.  Pulmonary:     Effort: Pulmonary effort is normal. No respiratory distress.     Breath sounds: Normal breath sounds. No wheezing or rales.  Abdominal:     General: Bowel sounds are normal. There is no distension.     Palpations: Abdomen is soft.     Tenderness: There is no abdominal tenderness. There is no rebound.  Musculoskeletal:     Cervical back: Normal range of motion.  Skin:    General: Skin is warm and dry.  Neurological:     Mental Status: She is alert and oriented to person, place, and time.     Coordination: Coordination normal.     Vitals:   08/19/22 0900  BP: 132/86  Pulse: 60  SpO2: 99%  Weight: 162 lb (73.5 kg)  Height: 5' 5.5" (1.664 m)    Assessment & Plan:

## 2022-08-19 NOTE — Assessment & Plan Note (Signed)
Flu shot given. Covid-19 counseled. Pneumonia complete. Shingrix complete. Tetanus due 2029. Cologuard due 2025. Mammogram due May 2024, pap smear aged out and dexa complete. Counseled about sun safety and mole surveillance. Counseled about the dangers of distracted driving. Given 10 year screening recommendations.

## 2022-11-14 ENCOUNTER — Encounter: Payer: Self-pay | Admitting: Internal Medicine

## 2022-12-16 ENCOUNTER — Telehealth: Payer: Self-pay | Admitting: Internal Medicine

## 2022-12-16 NOTE — Telephone Encounter (Signed)
Left message for patient to call back to schedule Medicare Annual Wellness Visit   Last AWV  12/31/21  Please schedule at anytime after 01/01/23 with [facility] if patient calls the office back.    30 Minutes appointment   Any questions, please call me at [phone]

## 2023-01-01 DIAGNOSIS — Z01419 Encounter for gynecological examination (general) (routine) without abnormal findings: Secondary | ICD-10-CM | POA: Diagnosis not present

## 2023-01-01 DIAGNOSIS — Z124 Encounter for screening for malignant neoplasm of cervix: Secondary | ICD-10-CM | POA: Diagnosis not present

## 2023-01-01 DIAGNOSIS — Z6826 Body mass index (BMI) 26.0-26.9, adult: Secondary | ICD-10-CM | POA: Diagnosis not present

## 2023-01-01 DIAGNOSIS — M810 Age-related osteoporosis without current pathological fracture: Secondary | ICD-10-CM | POA: Diagnosis not present

## 2023-01-01 DIAGNOSIS — Z01411 Encounter for gynecological examination (general) (routine) with abnormal findings: Secondary | ICD-10-CM | POA: Diagnosis not present

## 2023-02-19 ENCOUNTER — Telehealth: Payer: Self-pay | Admitting: Internal Medicine

## 2023-02-19 NOTE — Telephone Encounter (Signed)
Contacted Kim Anderson to schedule their annual wellness visit. Appointment made for 03/06/23.  Kim Anderson AWV direct phone # [phone]*

## 2023-03-06 ENCOUNTER — Telehealth (INDEPENDENT_AMBULATORY_CARE_PROVIDER_SITE_OTHER): Payer: Medicare HMO | Admitting: Family Medicine

## 2023-03-06 DIAGNOSIS — Z0000 Encounter for general adult medical examination without abnormal findings: Secondary | ICD-10-CM

## 2023-03-06 NOTE — Progress Notes (Signed)
PATIENT CHECK-IN and HEALTH RISK ASSESSMENT QUESTIONNAIRE:  -completed by phone/video for upcoming Medicare Preventive Visit  Pre-Visit Check-in: 1)Vitals (height, wt, BP, etc) - record in vitals section for visit on day of visit 2)Review and Update Medications, Allergies PMH, Surgeries, Social history in Epic 3)Hospitalizations in the last year with date/reason? No  4)Review and Update Care Team (patient's specialists) in Epic 5) Complete PHQ9 in Epic  6) Complete Fall Screening in Epic 7)Review all Health Maintenance Due and order under PCP if not done.  8)Medicare Wellness Questionnaire: Answer theses question about your habits: Do you drink alcohol? Yes If yes, how many drinks do you have a day? 2-3 weekly Have you ever smoked?No Quit date if applicable?   How many packs a day do/did you smoke?  Do you use smokeless tobacco? No Do you use an illicit drugs?No Do you exercises? Yes IF so, what type and how many days/minutes per week?Walking 3-4 weekly, 3-4 miles Are you sexually active? No Number of partners? Reports she mostly cooks at home  - hardly eats out Typical breakfast- Eggs, Fruit and toast Typical lunch Varies Typical dinner Varies Typical snacks: N/A  Beverages: Hot tea  Answer theses question about you: Can you perform most household chores?Yes Do you find it hard to follow a conversation in a noisy room?No Do you often ask people to speak up or repeat themselves?No Do you feel that you have a problem with memory?No Do you balance your checkbook and or bank acounts?Yes Do you feel safe at home?Yes Last dentist visit? 11/01/2022 Do you need assistance with any of the following: Please note if so   Driving?  Feeding yourself?  Getting from bed to chair?  Getting to the toilet?  Bathing or showering?  Dressing yourself?  Managing money?  Climbing a flight of stairs  Preparing meals?  Do you have Advanced Directives in place (Living Will, Healthcare Power or  Attorney)? Yes   Last eye Exam and location? 07/02/2022 Dr. Eulas [place]    Do you currently use prescribed or non-prescribed narcotic or opioid pain medications? No  Do you have a history or close family history of breast, ovarian, tubal or peritoneal cancer or a family member with BRCA (breast cancer susceptibility 1 and 2) gene mutations? No  Nurse/Assistant Credentials/time stamp: MG 9:30 AM    ----------------------------------------------------------------------------------------------------------------------------------------------------------------------------------------------------------------------   MEDICARE ANNUAL PREVENTIVE VISIT WITH PROVIDER: (Welcome to [employer], initial annual wellness or annual wellness exam)  Virtual Visit via Video Note  I connected with Kim Anderson on 03/06/23 by a video enabled telemedicine application and verified that I am speaking with the correct person using two identifiers.  Location patient: home Location provider:work or home office Persons participating in the virtual visit: patient, provider  Concerns and/or follow up today: no concerns today, doing well   See HM section in Epic for other details of completed HM.    ROS: negative for report of fevers, unintentional weight loss, vision changes, vision loss, hearing loss or change, chest pain, sob, hemoptysis, melena, hematochezia, hematuria, genital discharge or lesions, falls, bleeding or bruising, loc, thoughts of suicide or self harm, memory loss  Patient-completed extensive health risk assessment - reviewed and discussed with the patient: See Health Risk Assessment completed with patient prior to the visit either above or in recent phone note. This was reviewed in detailed with the patient today and appropriate recommendations, orders and referrals were placed as needed per Summary below and patient instructions.   Review of Medical History: -  PMH, [place], Family History and current  specialty and care providers reviewed and updated and listed below   Patient Care Team: Hoyt Koch, MD as PCP - General (Internal Medicine) Verl Blalock, Marijo Conception, MD (Inactive) (Cardiology) Erline Levine, MD (Neurosurgery) Brien Few, MD as Consulting Physician (Obstetrics and Gynecology)   Past Medical History:  Diagnosis Date   History of chest pain    Mitral valve prolapse    responded to beta-blockers. no evidence of this on 2-d echo/ happened long ago, has not been an issue for years    Palpitations     Past Surgical History:  Procedure Laterality Date   arm fracture Left 2017   CHOLECYSTECTOMY  1997   COLONOSCOPY  11/02/2007   normal   LEFT SHOULDER ACROMINOPLASTY Left    cleaned it up    Social History   Socioeconomic History   Marital status: Married    Spouse name: Not on file   Number of children: 4   Years of education: Not on file   Highest education level: Not on file  Occupational History   Occupation: Programmer, multimedia: [place]    Comment: Working on [employer]  Tobacco Use   Smoking status: Never   Smokeless tobacco: Never  Vaping Use   Vaping Use: Never used  Substance and Sexual Activity   Alcohol use: No   Drug use: No   Sexual activity: Not Currently  Other Topics Concern   Not on file  Social History Narrative   Lives with spouse   Social Determinants of Health   Financial Resource Strain: Low Risk  (12/31/2021)   Overall Financial Resource Strain (CARDIA)    Difficulty of Paying Living Expenses: Not hard at all  Food Insecurity: No Food Insecurity (12/31/2021)   Hunger Vital Sign    Worried About Running Out of Food in the Last Year: Never true    [place] in the Last Year: Never true  Transportation Needs: No Transportation Needs (12/31/2021)   PRAPARE - Hydrologist (Medical): No    Lack of Transportation (Non-Medical): No  Physical Activity: Sufficiently Active (12/31/2021)   Exercise  Vital Sign    Days of Exercise per Week: 5 days    Minutes of Exercise per Session: 30 min  Stress: No Stress Concern Present (12/31/2021)   [place]    Feeling of Stress : Not at all  Social Connections: [place] (12/31/2021)   Social Connection and Isolation Panel [NHANES]    Frequency of Communication with Friends and Family: More than three times a week    Frequency of Social Gatherings with Friends and Family: More than three times a week    Attends Religious Services: More than 4 times per year    Active Member of [employer] or Organizations: Yes    Attends Music therapist: More than 4 times per year    Marital Status: Married  Human resources officer Violence: Not At Risk (12/31/2021)   Humiliation, Afraid, Rape, and Kick questionnaire    Fear of Current or Ex-Partner: No    Emotionally Abused: No    Physically Abused: No    Sexually Abused: No    Family History  Problem Relation Age of Onset   Diabetes Mother    Cancer Mother    Multiple myeloma Mother    Hemochromatosis Brother    Hemachromatosis Brother    Diabetes Other  parent   Hypertension Other        parent    Current Outpatient Medications on File Prior to Visit  Medication Sig Dispense Refill   aspirin (ASPIR-LOW) 81 MG EC tablet Take 1 tablet (81 mg total) by mouth daily. Swallow whole. 30 tablet PRN   B Complex Vitamins (VITAMIN B COMPLEX) TABS TAKE 1 TABLET BY MOUTH DAILY. 100 tablet 1   Calcium Carbonate-Vitamin D 600-400 MG-UNIT tablet TAKE 1 TABLET BY MOUTH ONCE DAILY 150 tablet 5   metoprolol succinate (TOPROL-XL) 25 MG 24 hr tablet Take 1 tablet (25 mg total) by mouth daily. Annual appt is due w/labs must see provider for future refills 90 tablet 3   No current facility-administered medications on file prior to visit.    Allergies  Allergen Reactions   Amoxicillin     REACTION: Rash   Ampicillin     REACTION:  Rash   Cephalexin     REACTION: Itching/colitis   Meperidine Hcl     REACTION: Nausea \\T \ vomitting   Ofloxacin     REACTION: Insomnia   Sulfamethoxazole-Trimethoprim     REACTION: Hives       Physical Exam There were no vitals filed for this visit. Estimated body mass index is 26.55 kg/m as calculated from the following:   Height as of 08/19/22: 5' 5.5" (1.664 m).   Weight as of 08/19/22: 162 lb (73.5 kg).  EKG (optional): deferred due to virtual visit  GENERAL: alert, oriented, no acute distress detected, full vision exam deferred due to pandemic and/or virtual encounter  HEENT: atraumatic, conjunttiva clear, no obvious abnormalities on inspection of external nose and ears  NECK: normal movements of the head and neck  LUNGS: on inspection no signs of respiratory distress, breathing rate appears normal, no obvious gross SOB, gasping or wheezing  CV: no obvious cyanosis  MS: moves all visible extremities without noticeable abnormality  PSYCH/NEURO: pleasant and cooperative, no obvious depression or anxiety, speech and thought processing grossly intact, Cognitive function grossly intact  Flowsheet Row Video Visit from 03/06/2023 in [facility]  PHQ-9 Total Score 0           03/06/2023    9:24 AM 12/31/2021   11:58 AM 11/20/2020    2:17 PM 11/05/2019   10:33 AM 11/05/2018    8:18 AM  Depression screen PHQ 2/9  Decreased Interest 0 0 0 0 0  Down, Depressed, Hopeless 0 0 0 0 0  PHQ - 2 Score 0 0 0 0 0  Altered sleeping 0      Tired, decreased energy 0      Change in appetite 0      Feeling bad or failure about yourself  0      Trouble concentrating 0      Moving slowly or fidgety/restless 0      Suicidal thoughts 0      PHQ-9 Score 0      Difficult doing work/chores Not difficult at all           11/05/2019   10:24 AM 11/20/2020    2:20 PM 12/31/2021   12:21 PM 08/19/2022    9:04 AM 03/06/2023    9:26 AM  Fall Risk  Falls in the  past year? 0 0 0 0 0  Was there an injury with Fall? 0 0 0 0 0  Fall Risk Category Calculator 0 0 0 0 0  Fall Risk Category (Retired) Low Low  Low Low   (RETIRED) Patient Fall Risk Level Low fall risk Low fall risk Low fall risk    Patient at Risk for Falls Due to  No Fall Risks No Fall Risks    Fall risk Follow up  Falls evaluation completed Falls evaluation completed  Falls evaluation completed     SUMMARY AND PLAN:  Encounter for Medicare annual wellness exam    Discussed applicable health maintenance/preventive health measures and advised and referred or ordered per patient preferences:  Health Maintenance  Topic Date Due   COVID-19 Vaccine (8 - 2023-24 season) 09/02/2023 (Originally 01/09/2023), discussed new recommendations, she had booster in December and plans to update in the fall.   INFLUENZA VACCINE  07/03/2023   Medicare Annual Wellness (AWV)  03/05/2024   MAMMOGRAM  04/22/2024   Fecal DNA (Cologuard)  08/07/2024   DTaP/Tdap/Td (2 - Td or Tdap) 11/05/2028   Pneumonia Vaccine 30+ Years old  Completed   DEXA SCAN  Completed in 2023 with gyn. She had questions about this. Reviewed report findings with her and discussed treatment options for bone health. She wants to avoid medications. Discussed optimizing calcium, vitamin D to receive adequate amounts, foods with more calcium, foods that leech calcium, weight bearing - bone strengthening exercises.    Hepatitis C Screening  Completed   Zoster Vaccines- Shingrix  Completed   HPV VACCINES  Aged [employer] and counseling on the following was provided based on the above review of health and a plan/checklist for the patient, along with additional information discussed, was provided for the patient in the patient instructions :  -Advised and counseled on maintaining healthy lifestyle - including the importance of a healthy diet, regular physical activity, social connections and stress management. -Advised and counseled on  a whole foods based healthy diet and regular exercise. A summary of a healthy diet was provided in the Patient Instructions.Offered referral to dietician/weight management clinic if applicable and follow up virtual visits to assist further and monitor progress.  -Recommended continued regular exercise and discussed options within the community. Provided exercise guidelines. Discussed bone building exercises to add to her regimen.  -Advise yearly dental visits at minimum and regular eye exams -Advised and counseled on alcohol limits.   Follow up: see patient instructions     Patient Instructions  I really enjoyed getting to talk with you today! I am available on Tuesdays and Thursdays for virtual visits if you have any questions or concerns, or if I can be of any further assistance.   CHECKLIST FROM ANNUAL WELLNESS VISIT:  -Follow up (please call to schedule if not scheduled after visit):  -Inperson visit with your Primary Doctor office: -yearly for annual wellness visit with primary care office  Here is a list of your preventive care/health maintenance measures and the plan for each if any are due:  Health Maintenance  Topic Date Due   COVID-19 Vaccine (8 - 2023-24 season) 09/02/2023 (Originally 01/09/2023)   [place]  07/03/2023   Medicare Annual Wellness (AWV)  03/05/2024   MAMMOGRAM  04/22/2024   Fecal DNA (Cologuard)  08/07/2024   DTaP/Tdap/Td (2 - Td or Tdap) 11/05/2028   Pneumonia Vaccine 5+ Years old  Completed   DEXA SCAN  Completed   Hepatitis C Screening  Completed   Zoster Vaccines- Shingrix  Completed   HPV VACCINES  Aged Out    -See a dentist at least yearly  -Get your eyes checked and then per your eye  specialist's recommendations  -Other issues addressed today: -make sure to get 1200mg  of calcium per day, preferably from diet, but can add supplement if needed -[phone] IU Vitamin D3 supplement daily -weight bearing, bone building strength training  exercises - per our discussion  -I have included below further information regarding a healthy whole foods based diet, physical activity guidelines for adults, stress management and opportunities for social connections. I hope you find this information useful.   -----------------------------------------------------------------------------------------------------------------------------------------------------------------------------------------------------------------------------------------------------------  NUTRITION: -eat real food: lots of colorful vegetables (half the plate) and fruits -5-7 servings of vegetables and fruits per day (fresh or steamed is best), exp. 2 servings of vegetables with lunch and dinner and 2 servings of fruit per day. Berries and greens such as kale and collards are great choices.  -consume on a regular basis: whole grains (make sure first ingredient on label contains the word "whole"), fresh fruits, fish, nuts, seeds, healthy oils (such as olive oil, avocado oil, grape seed oil) -may eat small amounts of dairy and lean meat on occasion, but avoid processed meats such as ham, bacon, lunch meat, etc. -drink water -try to avoid fast food and pre-packaged foods, processed meat -most experts advise limiting sodium to < 2300mg  per day, should limit further is any chronic conditions such as high blood pressure, heart disease, diabetes, etc. The American Heart Association advised that < 1500mg  is is ideal -try to avoid foods that contain any ingredients with names you do not recognize  -try to avoid sugar/sweets (except for the natural sugar that occurs in fresh fruit) -try to avoid sweet drinks -try to avoid white rice, white bread, pasta (unless whole grain), white or yellow potatoes  EXERCISE GUIDELINES FOR ADULTS: -if you wish to increase your physical activity, do so gradually and with the approval of your doctor -STOP and seek medical care immediately if you have  any chest pain, chest discomfort or trouble breathing when starting or increasing exercise  -move and stretch your body, legs, feet and arms when sitting for long periods -Physical activity guidelines for optimal health in adults: -least 150 minutes per week of aerobic exercise (can talk, but not sing) once approved by your doctor, 20-30 minutes of sustained activity or two 10 minute episodes of sustained activity every day.  -resistance training at least 2 days per week if approved by your doctor -balance exercises 3+ days per week:   Stand somewhere where you have something sturdy to hold onto if you lose balance.    1) lift up on toes, start with 5x per day and work up to 20x   2) stand and lift on leg straight out to the side so that foot is a few inches of the floor, start with 5x each side and work up to 20x each side   3) stand on one foot, start with 5 seconds each side and work up to 20 seconds on each side  If you need ideas or help with getting more active:  -Silver sneakers https://tools.silversneakers.com  -Walk with a Doc: http://stephens-thompson.biz/  -try to include resistance (weight lifting/strength building) and balance exercises twice per week: or the following link for ideas: ChessContest.fr  UpdateClothing.com.cy  STRESS MANAGEMENT: -can try meditating, or just sitting quietly with deep breathing while intentionally relaxing all parts of your body for 5 minutes daily -if you need further help with stress, anxiety or depression please follow up with your primary doctor or contact the wonderful folks at [place]: [phone]  SOCIAL CONNECTIONS: -options  in [place] if you wish to engage in more social and exercise related activities:  -Silver sneakers https://tools.silversneakers.com  -Walk with a Doc: http://stephens-thompson.biz/  -Check out the [place] 50+ section on the [place] of [employer] (hiking clubs, book clubs, cards and games, chess, exercise classes, aquatic classes and much more) - see the website for details: https://www.[place].gov/departments/parks-recreation/active-adults50  -YouTube has lots of exercise videos for different ages and abilities as well  -[place] (a variety of indoor and outdoor inperson activities for adults). [phone]. [address]  -Virtual Online Classes (a variety of topics): see seniorplanet.org or call [phone]  -consider volunteering at a school, hospice center, church, senior center or elsewhere           Lucretia Kern, DO

## 2023-03-06 NOTE — Patient Instructions (Signed)
I really enjoyed getting to talk with you today! I am available on Tuesdays and Thursdays for virtual visits if you have any questions or concerns, or if I can be of any further assistance.   CHECKLIST FROM ANNUAL WELLNESS VISIT:  -Follow up (please call to schedule if not scheduled after visit):  -Inperson visit with your Primary Doctor office: -yearly for annual wellness visit with primary care office  Here is a list of your preventive care/health maintenance measures and the plan for each if any are due:  Health Maintenance  Topic Date Due   COVID-19 Vaccine (8 - 2023-24 season) 09/02/2023 (Originally 01/09/2023)   [place]  07/03/2023   Medicare Annual Wellness (AWV)  03/05/2024   MAMMOGRAM  04/22/2024   Fecal DNA (Cologuard)  08/07/2024   DTaP/Tdap/Td (2 - Td or Tdap) 11/05/2028   Pneumonia Vaccine 18+ Years old  Completed   DEXA SCAN  Completed   Hepatitis C Screening  Completed   Zoster Vaccines- Shingrix  Completed   HPV VACCINES  Aged Out    -See a dentist at least yearly  -Get your eyes checked and then per your eye specialist's recommendations  -Other issues addressed today: -make sure to get 1200mg  of calcium per day, preferably from diet, but can add supplement if needed -[phone] IU Vitamin D3 supplement daily -weight bearing, bone building strength training exercises - per our discussion  -I have included below further information regarding a healthy whole foods based diet, physical activity guidelines for adults, stress management and opportunities for social connections. I hope you find this information useful.   -----------------------------------------------------------------------------------------------------------------------------------------------------------------------------------------------------------------------------------------------------------  NUTRITION: -eat real food: lots of colorful vegetables (half the plate) and fruits -5-7  servings of vegetables and fruits per day (fresh or steamed is best), exp. 2 servings of vegetables with lunch and dinner and 2 servings of fruit per day. Berries and greens such as kale and collards are great choices.  -consume on a regular basis: whole grains (make sure first ingredient on label contains the word "whole"), fresh fruits, fish, nuts, seeds, healthy oils (such as olive oil, avocado oil, grape seed oil) -may eat small amounts of dairy and lean meat on occasion, but avoid processed meats such as ham, bacon, lunch meat, etc. -drink water -try to avoid fast food and pre-packaged foods, processed meat -most experts advise limiting sodium to < 2300mg  per day, should limit further is any chronic conditions such as high blood pressure, heart disease, diabetes, etc. The American Heart Association advised that < 1500mg  is is ideal -try to avoid foods that contain any ingredients with names you do not recognize  -try to avoid sugar/sweets (except for the natural sugar that occurs in fresh fruit) -try to avoid sweet drinks -try to avoid white rice, white bread, pasta (unless whole grain), white or yellow potatoes  EXERCISE GUIDELINES FOR ADULTS: -if you wish to increase your physical activity, do so gradually and with the approval of your doctor -STOP and seek medical care immediately if you have any chest pain, chest discomfort or trouble breathing when starting or increasing exercise  -move and stretch your body, legs, feet and arms when sitting for long periods -Physical activity guidelines for optimal health in adults: -least 150 minutes per week of aerobic exercise (can talk, but not sing) once approved by your doctor, 20-30 minutes of sustained activity or two 10 minute episodes of sustained activity every day.  -resistance training at least 2 days per week if approved by your  doctor -balance exercises 3+ days per week:   Stand somewhere where you have something sturdy to hold onto if  you lose balance.    1) lift up on toes, start with 5x per day and work up to 20x   2) stand and lift on leg straight out to the side so that foot is a few inches of the floor, start with 5x each side and work up to 20x each side   3) stand on one foot, start with 5 seconds each side and work up to 20 seconds on each side  If you need ideas or help with getting more active:  -Silver sneakers https://tools.silversneakers.com  -Walk with a Doc: http://stephens-thompson.biz/  -try to include resistance (weight lifting/strength building) and balance exercises twice per week: or the following link for ideas: ChessContest.fr  UpdateClothing.com.cy  STRESS MANAGEMENT: -can try meditating, or just sitting quietly with deep breathing while intentionally relaxing all parts of your body for 5 minutes daily -if you need further help with stress, anxiety or depression please follow up with your primary doctor or contact the wonderful folks at [place]: [place]: -options in [place] if you wish to engage in more social and exercise related activities:  -Silver sneakers https://tools.silversneakers.com  -Walk with a Doc: http://stephens-thompson.biz/  -Check out the [place] 50+ section on the [place] of [employer] (hiking clubs, book clubs, cards and games, chess, exercise classes, aquatic classes and much more) - see the website for details: https://www.[place].gov/departments/parks-recreation/active-adults50  -YouTube has lots of exercise videos for different ages and abilities as well  -[place] (a variety of indoor and outdoor inperson activities for adults). [phone]. [address]  -Virtual Online Classes (a variety of topics): see seniorplanet.org or call [phone]  -consider volunteering at a school,  hospice center, church, senior center or elsewhere

## 2023-03-12 NOTE — Progress Notes (Unsigned)
Rubin Payor, PhD, LAT, ATC acting as a scribe for Clementeen Graham, MD.  Kim Anderson is a 72 y.o. female who presents to [facility] at [facility] today for R hip and thumb pain. Pt was previously seen by Dr. Denyse Amass on 01/12/21 for her R hip and was given a R femoral acetabular steroid injection. Today, pt reports she is having a "burning" pain along the lateral, anterior, and groin aspect of her R hip. Pt c/o increased pain w/ driving, but better w/ activity.  Pt also c/o R thumb pain x about 1 month. She is R-hand dominate. Pt locates pain to the R MCP joint and now is unable to flex her R thumb. R thumb is swollen.   Grip strength: decreased Aggravates: use Treatments tried: immobilization, IBU, naproxen  Dx imaging: 12/05/20 R hip & L-spine XR  Pertinent review of systems: No fevers or chills  Relevant historical information: Palpitations   Exam:  BP 114/78   Pulse 64   Ht 5' 5.5" (1.664 m)   Wt 163 lb (73.9 kg)   SpO2 99%   BMI 26.71 kg/m  General: Well Developed, well nourished, and in no acute distress.   MSK: Right thumb: Some swelling at MCP and IP joint. Tender palpation palmar MCP.  Inability to flex IP joint fully with pain present on flexion. Capillary fill and sensation and pulses are intact.  Right hip: Normal-appearing normal motion pain with flexion.    Lab and Radiology Results  Procedure: Real-time Ultrasound Guided Injection of right thumb flexor tendon sheath at A1 pulley (trigger thumb injection) Device: Philips Affiniti 50G Images permanently stored and available for review in PACS Ultrasound evaluation prior to injection reveals hypoechoic fluid surrounding tendon and tendon sheath at A1 pulley Verbal informed consent obtained.  Discussed risks and benefits of procedure. Warned about infection, bleeding, hyperglycemia damage to structures among others. Patient expresses understanding and agreement Time-out conducted.   Noted no  overlying erythema, induration, or other signs of local infection.   Skin prepped in a sterile fashion.   Local anesthesia: Topical Ethyl chloride.   With sterile technique and under real time ultrasound guidance: 40 mg of Kenalog and 1 mL of lidocaine injected into tendon sheath at A1 pulley. Fluid seen entering the tendon sheath.   Completed without difficulty   Pain immediately resolved suggesting accurate placement of the medication.   Advised to call if fevers/chills, erythema, induration, drainage, or persistent bleeding.   Images permanently stored and available for review in the ultrasound unit.  Impression: Technically successful ultrasound guided injection.    Procedure: Real-time Ultrasound Guided Injection of right hip femoral acetabular joint anterior approach Device: Philips Affiniti 50G Images permanently stored and available for review in PACS Verbal informed consent obtained.  Discussed risks and benefits of procedure. Warned about infection, bleeding, hyperglycemia damage to structures among others. Patient expresses understanding and agreement Time-out conducted.   Noted no overlying erythema, induration, or other signs of local infection.   Skin prepped in a sterile fashion.   Local anesthesia: Topical Ethyl chloride.   With sterile technique and under real time ultrasound guidance: 40 mg Kenalog and 2 mL Marcaine injected into joint. Fluid seen entering the joint capsule.   Completed without difficulty   Pain immediately resolved suggesting accurate placement of the medication.   Advised to call if fevers/chills, erythema, induration, drainage, or persistent bleeding.   Images permanently stored and available for review in the ultrasound unit.  Impression: Technically successful ultrasound guided injection.         Assessment and Plan: 72 y.o. female with right thumb pain and swelling thought to be trigger thumb.  Plan for injection and double Band-Aid  splint.  Right hip and thigh pain.  This is more mysterious.  She was seen for this issue 2 years ago.  At that time the etiology was not fully well understood all that she did have a hip effusion and responded extremely well to intra-articular hip injection.  Similar appearance today we will treat similarly with a interarticular hip injection.  If not better can pursue further.   PDMP not reviewed this encounter. Orders Placed This Encounter  Procedures   [place] LIMITED JOINT SPACE STRUCTURES LOW RIGHT(NO LINKED CHARGES)    Order Specific Question:   Reason for Exam (SYMPTOM  OR DIAGNOSIS REQUIRED)    Answer:   right hip pain    Order Specific Question:   Preferred imaging location?    Answer:   [facility]   No orders of the defined types were placed in this encounter.    Discussed warning signs or symptoms. Please see discharge instructions. Patient expresses understanding.   The above documentation has been reviewed and is accurate and complete Clementeen Graham, M.D.

## 2023-03-13 ENCOUNTER — Other Ambulatory Visit: Payer: Self-pay

## 2023-03-13 ENCOUNTER — Ambulatory Visit: Payer: Medicare HMO | Admitting: Family Medicine

## 2023-03-13 VITALS — BP 114/78 | HR 64 | Ht 65.5 in | Wt 163.0 lb

## 2023-03-13 DIAGNOSIS — M65311 Trigger thumb, right thumb: Secondary | ICD-10-CM | POA: Insufficient documentation

## 2023-03-13 DIAGNOSIS — G8929 Other chronic pain: Secondary | ICD-10-CM

## 2023-03-13 DIAGNOSIS — M25551 Pain in right hip: Secondary | ICD-10-CM

## 2023-03-13 NOTE — Patient Instructions (Addendum)
Thank you for coming in today.   You received an injection today. Seek immediate medical attention if the joint becomes red, extremely painful, or is oozing fluid.   Try using a Double Band-Aid Splint on your thumb.  Let me know how this goes.

## 2023-04-04 ENCOUNTER — Telehealth: Payer: Medicare HMO | Admitting: Nurse Practitioner

## 2023-04-04 DIAGNOSIS — L237 Allergic contact dermatitis due to plants, except food: Secondary | ICD-10-CM

## 2023-04-04 MED ORDER — PREDNISONE 10 MG PO TABS: ORAL_TABLET | ORAL | 0 refills | Status: DC

## 2023-04-04 NOTE — Progress Notes (Signed)
E-Visit for Poison Ivy  We are sorry that you are not feeing well.  Here is how we plan to help!  Based on what you have shared with me it looks like you have had an allergic reaction to the oily resin from a group of plants.  This resin is very sticky, so it easily attaches to your skin, clothing, tools equipment, and pet's fur.    This blistering rash is often called poison ivy rash although it can come from contact with the leaves, stems and roots of poison ivy, poison oak and poison sumac.  The oily resin contains urushiol (u-ROO-she-ol) that produces a skin rash on exposed skin.  The severity of the rash depends on the amount of urushiol that gets on your skin.  A section of skin with more urushiol on it may develop a rash sooner.  The rash usually develops 12-48 hours after exposure and can last two to three weeks.  Your skin must come in direct contact with the plant's oil to be affected.  Blister fluid doesn't spread the rash.  However, if you come into contact with a piece of clothing or pet fur that has urushiol on it, the rash may spread out.  You can also transfer the oil to other parts of your body with your fingers.  Often the rash looks like a straight line because of the way the plant brushes against your skin.  Since your rash is widespread or has resulted in a large number of blisters, I have prescribed an oral corticosteroid.  Please follow these recommendations:  I have sent a prednisone dose pack to your chosen pharmacy. Be sure to follow the instructions carefully and complete the entire prescription. You may use Benadryl or Caladryl topical lotions to sooth the itch and remember cool, not hot, showers and baths can help relieve the itching!  Place cool, wet compresses on the affected area for 15-30 minutes several times a day.  You may also take oral antihistamines, such as diphenhydramine (Benadryl, others), which may also help you sleep better.  Watch your skin for any purulent  (pus) drainage or red streaking from the site.  If this occurs, contact your provider.  You may require an antibiotic for a skin infection.  Make sure that the clothes you were wearing as well as any towels or sheets that may have come in contact with the oil (urushiol) are washed in detergent and hot water.       I have developed the following plan to treat your condition I am prescribing a two week course of steroids (37 tablets of 10 mg prednisone).  Days 1-4 take 4 tablets (40 mg) daily  Days 5-8 take 3 tablets (30 mg) daily, Days 9-11 take 2 tablets (20 mg) daily, Days 12-14 take 1 tablet (10 mg) daily.    What can you do to prevent this rash?  Avoid the plants.  Learn how to identify poison ivy, poison oak and poison sumac in all seasons.  When hiking or engaging in other activities that might expose you to these plants, try to stay on cleared pathways.  If camping, make sure you pitch your tent in an area free of these plants.  Keep pets from running through wooded areas so that urushiol doesn't accidentally stick to their fur, which you may touch.  Remove or kill the plants.  In your yard, you can get rid of poison ivy by applying an herbicide or pulling it out of   the ground, including the roots, while wearing heavy gloves.  Afterward remove the gloves and thoroughly wash them and your hands.  Don't burn poison ivy or related plants because the urushiol can be carried by smoke.  Wear protective clothing.  If needed, protect your skin by wearing socks, boots, pants, long sleeves and vinyl gloves.  Wash your skin right away.  Washing off the oil with soap and water within 30 minutes of exposure may reduce your chances of getting a poison ivy rash.  Even washing after an hour or so can help reduce the severity of the rash.  If you walk through some poison ivy and then later touch your shoes, you may get some urushiol on your hands, which may then transfer to your face or body by touching or  rubbing.  If the contaminated object isn't cleaned, the urushiol on it can still cause a skin reaction years later.    Be careful not to reuse towels after you have washed your skin.  Also carefully wash clothing in detergent and hot water to remove all traces of the oil.  Handle contaminated clothing carefully so you don't transfer the urushiol to yourself, furniture, rugs or appliances.  Remember that pets can carry the oil on their fur and paws.  If you think your pet may be contaminated with urushiol, put on some long rubber gloves and give your pet a bath.  Finally, be careful not to burn these plants as the smoke can contain traces of the oil.  Inhaling the smoke may result in difficulty breathing. If that occurred you should see a physician as soon as possible.  See your doctor right away if:  The reaction is severe or widespread You inhaled the smoke from burning poison ivy and are having difficulty breathing Your skin continues to swell The rash affects your eyes, mouth or genitals Blisters are oozing pus You develop a fever greater than 100 F (37.8 C) The rash doesn't get better within a few weeks.  If you scratch the poison ivy rash, bacteria under your fingernails may cause the skin to become infected.  See your doctor if pus starts oozing from the blisters.  Treatment generally includes antibiotics.  Poison ivy treatments are usually limited to self-care methods.  And the rash typically goes away on its own in two to three weeks.     If the rash is widespread or results in a large number of blisters, your doctor may prescribe an oral corticosteroid, such as prednisone.  If a bacterial infection has developed at the rash site, your doctor may give you a prescription for an oral antibiotic.  MAKE SURE YOU  Understand these instructions. Will watch your condition. Will get help right away if you are not doing well or get worse.   Thank you for choosing an e-visit.  Your  e-visit answers were reviewed by a board certified advanced clinical practitioner to complete your personal care plan. Depending upon the condition, your plan could have included both over the counter or prescription medications.  Please review your pharmacy choice. Make sure the pharmacy is open so you can pick up prescription now. If there is a problem, you may contact your provider through MyChart messaging and have the prescription routed to another pharmacy.  Your safety is important to us. If you have drug allergies check your prescription carefully.   For the next 24 hours you can use MyChart to ask questions about today's visit, request a non-urgent   call back, or ask for a work or school excuse. You will get an email in the next two days asking about your experience. I hope that your e-visit has been valuable and will speed your recovery.  Meds ordered this encounter  Medications   predniSONE (DELTASONE) 10 MG tablet    Sig: Take 4 tablets (40mg) on days 1-4, then 3 tablets (30mg) on days 5-8, then 2 tablets (20mg) on days 9-11, then 1 tablet daily for days 12-14. Take with food.    Dispense:  37 tablet    Refill:  0    I spent approximately 5 minutes reviewing the patient's history, current symptoms and coordinating their care today.   

## 2023-05-14 DIAGNOSIS — I10 Essential (primary) hypertension: Secondary | ICD-10-CM | POA: Diagnosis not present

## 2023-05-14 DIAGNOSIS — Z882 Allergy status to sulfonamides status: Secondary | ICD-10-CM | POA: Diagnosis not present

## 2023-05-14 DIAGNOSIS — Z8249 Family history of ischemic heart disease and other diseases of the circulatory system: Secondary | ICD-10-CM | POA: Diagnosis not present

## 2023-05-14 DIAGNOSIS — Z809 Family history of malignant neoplasm, unspecified: Secondary | ICD-10-CM | POA: Diagnosis not present

## 2023-05-14 DIAGNOSIS — Z880 Allergy status to penicillin: Secondary | ICD-10-CM | POA: Diagnosis not present

## 2023-05-14 DIAGNOSIS — Z881 Allergy status to other antibiotic agents status: Secondary | ICD-10-CM | POA: Diagnosis not present

## 2023-05-14 DIAGNOSIS — I872 Venous insufficiency (chronic) (peripheral): Secondary | ICD-10-CM | POA: Diagnosis not present

## 2023-05-14 DIAGNOSIS — Z833 Family history of diabetes mellitus: Secondary | ICD-10-CM | POA: Diagnosis not present

## 2023-05-14 DIAGNOSIS — Z7982 Long term (current) use of aspirin: Secondary | ICD-10-CM | POA: Diagnosis not present

## 2023-06-03 ENCOUNTER — Other Ambulatory Visit: Payer: Self-pay | Admitting: General Surgery

## 2023-06-03 DIAGNOSIS — Z1231 Encounter for screening mammogram for malignant neoplasm of breast: Secondary | ICD-10-CM

## 2023-06-06 ENCOUNTER — Ambulatory Visit
Admission: RE | Admit: 2023-06-06 | Discharge: 2023-06-06 | Disposition: A | Discharge status: Home / Self Care | Payer: Medicare HMO | Source: Ambulatory Visit | Attending: General Surgery | Admitting: General Surgery

## 2023-06-06 DIAGNOSIS — Z1231 Encounter for screening mammogram for malignant neoplasm of breast: Secondary | ICD-10-CM

## 2023-07-17 ENCOUNTER — Encounter (INDEPENDENT_AMBULATORY_CARE_PROVIDER_SITE_OTHER): Payer: Self-pay

## 2023-08-11 DIAGNOSIS — H524 Presbyopia: Secondary | ICD-10-CM | POA: Diagnosis not present

## 2023-08-11 DIAGNOSIS — H52223 Regular astigmatism, bilateral: Secondary | ICD-10-CM | POA: Diagnosis not present

## 2023-08-25 ENCOUNTER — Encounter: Payer: Self-pay | Admitting: Internal Medicine

## 2023-08-25 ENCOUNTER — Ambulatory Visit: Payer: Medicare HMO | Admitting: Internal Medicine

## 2023-08-25 VITALS — BP 132/82 | HR 68 | Temp 98.0°F | Ht 65.5 in | Wt 166.0 lb

## 2023-08-25 DIAGNOSIS — R002 Palpitations: Secondary | ICD-10-CM

## 2023-08-25 DIAGNOSIS — Z0000 Encounter for general adult medical examination without abnormal findings: Secondary | ICD-10-CM | POA: Diagnosis not present

## 2023-08-25 LAB — COMPREHENSIVE METABOLIC PANEL
ALT: 12 U/L (ref 0–35)
AST: 18 U/L (ref 0–37)
Albumin: 4.6 g/dL (ref 3.5–5.2)
Alkaline Phosphatase: 60 U/L (ref 39–117)
BUN: 10 mg/dL (ref 6–23)
CO2: 27 mEq/L (ref 19–32)
Calcium: 9.7 mg/dL (ref 8.4–10.5)
Chloride: 103 mEq/L (ref 96–112)
Creatinine, Ser: 0.85 mg/dL (ref 0.40–1.20)
GFR: 69.43 mL/min (ref >60.00)
Glucose, Bld: 99 mg/dL (ref 70–99)
Potassium: 4.6 mEq/L (ref 3.5–5.1)
Sodium: 138 mEq/L (ref 135–145)
Total Bilirubin: 1 mg/dL (ref 0.2–1.2)
Total Protein: 7.4 g/dL (ref 6.0–8.3)

## 2023-08-25 LAB — CBC
HCT: 44.9 % (ref 36.0–46.0)
Hemoglobin: 14.9 g/dL (ref 12.0–15.0)
MCHC: 33.3 g/dL (ref 30.0–36.0)
MCV: 93.5 fl (ref 78.0–100.0)
Platelets: 219 10*3/uL (ref 150.0–400.0)
RBC: 4.8 Mil/uL (ref 3.87–5.11)
RDW: 12.7 % (ref 11.5–15.5)
WBC: 5.5 10*3/uL (ref 4.0–10.5)

## 2023-08-25 LAB — LIPID PANEL
Cholesterol: 225 mg/dL — ABNORMAL HIGH (ref 0–200)
HDL: 91.6 mg/dL (ref >39.00)
LDL Cholesterol: 120 mg/dL — ABNORMAL HIGH (ref 0–99)
NonHDL: 133.52
Total CHOL/HDL Ratio: 2
Triglycerides: 69 mg/dL (ref 0.0–149.0)
VLDL: 13.8 mg/dL (ref 0.0–40.0)

## 2023-08-25 MED ORDER — METOPROLOL SUCCINATE ER 25 MG PO TB24: 25.0000 mg | ORAL_TABLET | Freq: Every day | ORAL | 3 refills | Status: DC

## 2023-08-25 NOTE — Progress Notes (Unsigned)
Subjective:   Patient ID: Kim Anderson, female    DOB: 1951-09-25, 72 y.o.   MRN: [id number]  HPI The patient is here for physical.  PMH, [facility], social history reviewed and updated  Review of Systems  Constitutional: Negative.   HENT: Negative.    Eyes: Negative.   Respiratory:  Negative for cough, chest tightness and shortness of breath.   Cardiovascular:  Negative for chest pain, palpitations and leg swelling.  Gastrointestinal:  Negative for abdominal distention, abdominal pain, constipation, diarrhea, nausea and vomiting.  Musculoskeletal: Negative.   Skin: Negative.   Neurological: Negative.   Psychiatric/Behavioral: Negative.      Objective:  Physical Exam Constitutional:      Appearance: She is well-developed.  HENT:     Head: Normocephalic and atraumatic.  Cardiovascular:     Rate and Rhythm: Normal rate and regular rhythm.  Pulmonary:     Effort: Pulmonary effort is normal. No respiratory distress.     Breath sounds: Normal breath sounds. No wheezing or rales.  Abdominal:     General: Bowel sounds are normal. There is no distension.     Palpations: Abdomen is soft.     Tenderness: There is no abdominal tenderness. There is no rebound.  Musculoskeletal:     Cervical back: Normal range of motion.  Skin:    General: Skin is warm and dry.  Neurological:     Mental Status: She is alert and oriented to person, place, and time.     Coordination: Coordination normal.     Vitals:   08/25/23 0859  BP: 132/82  Pulse: 68  Temp: 98 F (36.7 C)  TempSrc: Oral  SpO2: 94%  Weight: 166 lb (75.3 kg)  Height: 5' 5.5" (1.664 m)    Assessment & Plan:

## 2023-08-28 NOTE — Assessment & Plan Note (Signed)
Flu shot yearly. Pneumonia complete. Shingrix complete. Tetanus up to date. Cologuard up to date. Mammogram up to date, pap smear aged out and dexa complete. Counseled about sun safety and mole surveillance. Counseled about the dangers of distracted driving. Given 10 year screening recommendations.

## 2023-08-28 NOTE — Assessment & Plan Note (Signed)
Controlled on metoprolol 25 mg daily and will continue.

## 2023-09-04 ENCOUNTER — Ambulatory Visit: Payer: Medicare HMO | Admitting: Emergency Medicine

## 2023-09-04 ENCOUNTER — Encounter: Payer: Self-pay | Admitting: Internal Medicine

## 2023-09-04 VITALS — BP 124/84 | HR 63 | Temp 97.8°F | Ht 65.5 in | Wt 167.4 lb

## 2023-09-04 DIAGNOSIS — H8113 Benign paroxysmal vertigo, bilateral: Secondary | ICD-10-CM | POA: Diagnosis not present

## 2023-09-04 MED ORDER — ONDANSETRON 4 MG PO TBDP
4.0000 mg | ORAL_TABLET | Freq: Three times a day (TID) | ORAL | 0 refills | Status: AC | PRN
Start: 2023-09-04 — End: ?

## 2023-09-04 NOTE — Assessment & Plan Note (Signed)
Clinically stable.  No red flag signs or symptoms. Normal neurological examination Stable vital signs.  Afebrile. No clinical findings of stroke ED precautions given Differential diagnosis discussed.  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 Patient prefers to use Zofran and ODT as needed Advised to contact the office if no better or clinical picture changes within the next couple days.

## 2023-09-04 NOTE — Patient Instructions (Signed)
Benign Positional Vertigo Vertigo is the feeling that you or your surroundings are moving when they are not. Benign positional vertigo is the most common form of vertigo. This is usually a harmless condition (benign). This condition is positional. This means that symptoms are triggered by certain movements and positions. This condition can be dangerous if it occurs while you are doing something that could cause harm to yourself or others. This includes activities such as driving or operating machinery. What are the causes? The inner ear has fluid-filled canals that help your brain sense movement and balance. When the fluid moves, the brain receives messages about your body's position. With benign positional vertigo, calcium crystals in the inner ear break free and disturb the inner ear area. This causes your brain to receive confusing messages about your body's position. What increases the risk? You are more likely to develop this condition if: You are a woman. You are 74 years of age or older. You have recently had a head injury. You have an inner ear disease. What are the signs or symptoms? Symptoms of this condition usually happen when you move your head or your eyes in different directions. Symptoms may start suddenly and usually last for less than a minute. They include: Loss of balance and falling. Feeling like you are spinning or moving. Feeling like your surroundings are spinning or moving. Nausea and vomiting. Blurred vision. Dizziness. Involuntary eye movement (nystagmus). Symptoms can be mild and cause only minor problems, or they can be severe and interfere with daily life. Episodes of benign positional vertigo may return (recur) over time. Symptoms may also improve over time. How is this diagnosed? This condition may be diagnosed based on: Your medical history. A physical exam of the head, neck, and ears. Positional tests to check for or stimulate vertigo. You may be asked to  turn your head and change positions, such as going from sitting to lying down. A health care provider will watch for symptoms of vertigo. You may be referred to a health care provider who specializes in ear, nose, and throat problems (ENT or otolaryngologist) or a provider who specializes in disorders of the nervous system (neurologist). How is this treated?  This condition may be treated in a session in which your health care provider moves your head in specific positions to help the displaced crystals in your inner ear move. Treatment for this condition may take several sessions. Surgery may be needed in severe cases, but this is rare. In some cases, benign positional vertigo may resolve on its own in 2-4 weeks. Follow these instructions at home: Safety Move slowly. Avoid sudden body or head movements or certain positions, as told by your health care provider. Avoid driving or operating machinery until your health care provider says it is safe. Avoid doing any tasks that would be dangerous to you or others if vertigo occurs. If you have trouble walking or keeping your balance, try using a cane for stability. If you feel dizzy or unstable, sit down right away. Return to your normal activities as told by your health care provider. Ask your health care provider what activities are safe for you. General instructions Take over-the-counter and prescription medicines only as told by your health care provider. Drink enough fluid to keep your urine pale yellow. Keep all follow-up visits. This is important. Contact a health care provider if: You have a fever. Your condition gets worse or you develop new symptoms. Your family or friends notice any behavioral changes.  You have nausea or vomiting that gets worse. You have numbness or a prickling and tingling sensation. Get help right away if you: Have difficulty speaking or moving. Are always dizzy or faint. Develop severe headaches. Have weakness in  your legs or arms. Have changes in your hearing or vision. Develop a stiff neck. Develop sensitivity to light. These symptoms may represent a serious problem that is an emergency. Do not wait to see if the symptoms will go away. Get medical help right away. Call your local emergency services (911 in the [place] Do not drive yourself to the hospital. Summary Vertigo is the feeling that you or your surroundings are moving when they are not. Benign positional vertigo is the most common form of vertigo. This condition is caused by calcium crystals in the inner ear that become displaced. This causes a disturbance in an area of the inner ear that helps your brain sense movement and balance. Symptoms include loss of balance and falling, feeling that you or your surroundings are moving, nausea and vomiting, and blurred vision. This condition can be diagnosed based on symptoms, a physical exam, and positional tests. Follow safety instructions as told by your health care provider and keep all follow-up visits. This is important. This information is not intended to replace advice given to you by your health care provider. Make sure you discuss any questions you have with your health care provider. Document Revised: 10/18/2020 Document Reviewed: 10/18/2020 Elsevier Patient Education  2024 [employer].

## 2023-09-04 NOTE — Progress Notes (Signed)
Kim Anderson 72 y.o.   Chief Complaint  Patient presents with   Dizziness    Patient states the dizziness started this am , any movement, nausea     HISTORY OF PRESENT ILLNESS: Acute problem visit today This is a 72 y.o. female complaining of acute onset dizziness which started today around 6:30 in the morning, worse with movement along with nausea. Denies syncope.  Denies headache.  Denies speech problems or extremity weakness.  Denies visual problems.  No other associated symptoms No recent head injury No other complaints or medical concerns today.  Dizziness Pertinent negatives include no abdominal pain, chest pain, chills, congestion, coughing, fever, headaches, nausea, rash, sore throat or vomiting.     Prior to Admission medications   Medication Sig Start Date End Date Taking? Authorizing Provider  aspirin (ASPIR-LOW) 81 MG EC tablet Take 1 tablet (81 mg total) by mouth daily. Swallow whole. 09/09/18  Yes Myrlene Broker, MD  B Complex Vitamins (VITAMIN B COMPLEX) TABS TAKE 1 TABLET BY MOUTH DAILY. 05/08/18  Yes Corwin Levins, MD  Calcium Carbonate-Vitamin D 600-400 MG-UNIT tablet TAKE 1 TABLET BY MOUTH ONCE DAILY 05/08/18  Yes Corwin Levins, MD  metoprolol succinate (TOPROL-XL) 25 MG 24 hr tablet Take 1 tablet (25 mg total) by mouth daily. Annual appt is due w/labs must see provider for future refills 08/25/23  Yes Myrlene Broker, MD  ondansetron (ZOFRAN-ODT) 4 MG disintegrating tablet Take 1 tablet (4 mg total) by mouth every 8 (eight) hours as needed for nausea or vomiting. 09/04/23  Yes Georgina Quint, MD    Allergies  Allergen Reactions   Amoxicillin     REACTION: Rash   Ampicillin     REACTION: Rash   Cephalexin     REACTION: Itching/colitis   Meperidine Hcl     REACTION: Nausea \\T \ vomitting   Ofloxacin     REACTION: Insomnia   Sulfamethoxazole-Trimethoprim     REACTION: Hives    Patient Active Problem List   Diagnosis Date Noted   Chronic  right hip pain 03/13/2023   Trigger thumb, right thumb 03/13/2023   Palpitations 05/30/2009    Past Medical History:  Diagnosis Date   History of chest pain    Mitral valve prolapse    responded to beta-blockers. no evidence of this on 2-d echo/ happened long ago, has not been an issue for years    Palpitations     Past Surgical History:  Procedure Laterality Date   arm fracture Left 2017   CHOLECYSTECTOMY  1997   COLONOSCOPY  11/02/2007   normal   LEFT SHOULDER ACROMINOPLASTY Left    cleaned it up    Social History   Socioeconomic History   Marital status: Married    Spouse name: Not on file   Number of children: 4   Years of education: Not on file   Highest education level: Master's degree (e.g., MA, MS, MEng, MEd, MSW, MBA)  Occupational History   Occupation: Teacher, adult education: [employer]    Comment: Working on Education administrator  Tobacco Use   Smoking status: Never   Smokeless tobacco: Never  Vaping Use   Vaping status: Never Used  Substance and Sexual Activity   Alcohol use: No   Drug use: No   Sexual activity: Not Currently  Other Topics Concern   Not on file  Social History Narrative   Lives with spouse   Social Determinants of Corporate investment banker  Strain: Low Risk  (09/04/2023)   Overall Financial Resource Strain (CARDIA)    Difficulty of Paying Living Expenses: Not hard at all  Food Insecurity: No Food Insecurity (09/04/2023)   Hunger Vital Sign    Worried About Running Out of Food in the Last Year: Never true    Ran Out of Food in the Last Year: Never true  Transportation Needs: No Transportation Needs (09/04/2023)   PRAPARE - Administrator, Civil Service (Medical): No    Lack of Transportation (Non-Medical): No  Physical Activity: Sufficiently Active (09/04/2023)   Exercise Vital Sign    Days of Exercise per Week: 4 days    Minutes of Exercise per Session: 40 min  Stress: No Stress Concern Present (09/04/2023)   [employer] of  Occupational Health - Occupational Stress Questionnaire    Feeling of Stress : Only a little  Social Connections: Moderately Integrated (09/04/2023)   Social Connection and Isolation Panel [NHANES]    Frequency of Communication with Friends and Family: More than three times a week    Frequency of Social Gatherings with Friends and Family: Once a week    Attends Religious Services: More than 4 times per year    Active Member of [employer] or Organizations: No    Attends Engineer, structural: Not on file    Marital Status: Married  Catering manager Violence: Not At Risk (12/31/2021)   Humiliation, Afraid, Rape, and Kick questionnaire    Fear of Current or Ex-Partner: No    Emotionally Abused: No    Physically Abused: No    Sexually Abused: No    Family History  Problem Relation Age of Onset   Diabetes Mother    Cancer Mother    Multiple myeloma Mother    Hemochromatosis Brother    Hemachromatosis Brother    Diabetes Other        parent   Hypertension Other        parent   Breast cancer Neg Hx      Review of Systems  Constitutional: Negative.  Negative for chills and fever.  HENT: Negative.  Negative for congestion and sore throat.   Eyes:  Negative for blurred vision and double vision.  Respiratory: Negative.  Negative for cough and shortness of breath.   Cardiovascular: Negative.  Negative for chest pain and palpitations.  Gastrointestinal:  Negative for abdominal pain, diarrhea, nausea and vomiting.  Genitourinary: Negative.  Negative for dysuria and hematuria.  Skin: Negative.  Negative for rash.  Neurological:  Positive for dizziness. Negative for sensory change, speech change, focal weakness, seizures, loss of consciousness and headaches.  All other systems reviewed and are negative.   Today's Vitals   09/04/23 1403 09/04/23 1428  BP: (!) 140/96 124/84  Pulse: 63   Temp: 97.8 F (36.6 C)   TempSrc: Oral   SpO2: 98%   Weight: 167 lb 6 oz (75.9 kg)   Height:  5' 5.5" (1.664 m)    Body mass index is 27.43 kg/m.   Physical Exam Vitals reviewed.  Constitutional:      Appearance: Normal appearance.  HENT:     Head: Normocephalic.     Right Ear: Tympanic membrane, ear canal and external ear normal.     Left Ear: Tympanic membrane, ear canal and external ear normal.     Mouth/Throat:     Mouth: Mucous membranes are moist.     Pharynx: Oropharynx is clear.  Eyes:     Extraocular  Movements: Extraocular movements intact.     Conjunctiva/sclera: Conjunctivae normal.     Pupils: Pupils are equal, round, and reactive to light.  Neck:     Vascular: No carotid bruit.  Cardiovascular:     Rate and Rhythm: Normal rate and regular rhythm.     Pulses: Normal pulses.     Heart sounds: Normal heart sounds.  Pulmonary:     Effort: Pulmonary effort is normal.     Breath sounds: Normal breath sounds.  Musculoskeletal:     Cervical back: No tenderness.  Lymphadenopathy:     Cervical: No cervical adenopathy.  Skin:    General: Skin is warm and dry.  Neurological:     General: No focal deficit present.     Mental Status: She is alert and oriented to person, place, and time.     Cranial Nerves: No cranial nerve deficit.     Sensory: No sensory deficit.     Motor: No weakness.     Coordination: Coordination normal.     Gait: Gait normal.      ASSESSMENT & PLAN: A total of 33 minutes was spent with the patient and counseling/coordination of care regarding preparing for this visit, review of most recent office visit notes, review of chronic medical conditions, review of all medications, differential diagnosis of vertigo and management, prognosis, ED precautions, documentation and need for follow-up.  Problem List Items Addressed This Visit       Nervous and Auditory   Benign paroxysmal positional vertigo due to bilateral vestibular disorder - Primary    Clinically stable.  No red flag signs or symptoms. Normal neurological examination Stable  vital signs.  Afebrile. No clinical findings of stroke ED precautions given Differential diagnosis discussed.  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 Patient prefers to use Zofran and ODT as needed Advised to contact the office if no better or clinical picture changes within the next couple days.      Relevant Medications   ondansetron (ZOFRAN-ODT) 4 MG disintegrating tablet   Other Relevant Orders   PT vestibular rehab   Patient Instructions  Benign Positional Vertigo Vertigo is the feeling that you or your surroundings are moving when they are not. Benign positional vertigo is the most common form of vertigo. This is usually a harmless condition (benign). This condition is positional. This means that symptoms are triggered by certain movements and positions. This condition can be dangerous if it occurs while you are doing something that could cause harm to yourself or others. This includes activities such as driving or operating machinery. What are the causes? The inner ear has fluid-filled canals that help your brain sense movement and balance. When the fluid moves, the brain receives messages about your body's position. With benign positional vertigo, calcium crystals in the inner ear break free and disturb the inner ear area. This causes your brain to receive confusing messages about your body's position. What increases the risk? You are more likely to develop this condition if: You are a woman. You are 98 years of age or older. You have recently had a head injury. You have an inner ear disease. What are the signs or symptoms? Symptoms of this condition usually happen when you move your head or your eyes in different directions. Symptoms may start suddenly and usually last for less than a minute. They include: Loss of balance and falling. Feeling like you are spinning or moving. Feeling like your surroundings are spinning or moving. Nausea and vomiting. Blurred vision. Dizziness.  Involuntary eye  movement (nystagmus). Symptoms can be mild and cause only minor problems, or they can be severe and interfere with daily life. Episodes of benign positional vertigo may return (recur) over time. Symptoms may also improve over time. How is this diagnosed? This condition may be diagnosed based on: Your medical history. A physical exam of the head, neck, and ears. Positional tests to check for or stimulate vertigo. You may be asked to turn your head and change positions, such as going from sitting to lying down. A health care provider will watch for symptoms of vertigo. You may be referred to a health care provider who specializes in ear, nose, and throat problems (ENT or otolaryngologist) or a provider who specializes in disorders of the nervous system (neurologist). How is this treated?  This condition may be treated in a session in which your health care provider moves your head in specific positions to help the displaced crystals in your inner ear move. Treatment for this condition may take several sessions. Surgery may be needed in severe cases, but this is rare. In some cases, benign positional vertigo may resolve on its own in 2-4 weeks. Follow these instructions at home: Safety Move slowly. Avoid sudden body or head movements or certain positions, as told by your health care provider. Avoid driving or operating machinery until your health care provider says it is safe. Avoid doing any tasks that would be dangerous to you or others if vertigo occurs. If you have trouble walking or keeping your balance, try using a cane for stability. If you feel dizzy or unstable, sit down right away. Return to your normal activities as told by your health care provider. Ask your health care provider what activities are safe for you. General instructions Take over-the-counter and prescription medicines only as told by your health care provider. Drink enough fluid to keep your urine pale yellow. Keep all  follow-up visits. This is important. Contact a health care provider if: You have a fever. Your condition gets worse or you develop new symptoms. Your family or friends notice any behavioral changes. You have nausea or vomiting that gets worse. You have numbness or a prickling and tingling sensation. Get help right away if you: Have difficulty speaking or moving. Are always dizzy or faint. Develop severe headaches. Have weakness in your legs or arms. Have changes in your hearing or vision. Develop a stiff neck. Develop sensitivity to light. These symptoms may represent a serious problem that is an emergency. Do not wait to see if the symptoms will go away. Get medical help right away. Call your local emergency services (911 in the [place] Do not drive yourself to the hospital. Summary Vertigo is the feeling that you or your surroundings are moving when they are not. Benign positional vertigo is the most common form of vertigo. This condition is caused by calcium crystals in the inner ear that become displaced. This causes a disturbance in an area of the inner ear that helps your brain sense movement and balance. Symptoms include loss of balance and falling, feeling that you or your surroundings are moving, nausea and vomiting, and blurred vision. This condition can be diagnosed based on symptoms, a physical exam, and positional tests. Follow safety instructions as told by your health care provider and keep all follow-up visits. This is important. This information is not intended to replace advice given to you by your health care provider. Make sure you discuss any questions you have with your health  care provider. Document Revised: 10/18/2020 Document Reviewed: 10/18/2020 Elsevier Patient Education  2024 Elsevier Inc.      Edwina Barth, MD [facility]

## 2023-09-09 DIAGNOSIS — H52223 Regular astigmatism, bilateral: Secondary | ICD-10-CM | POA: Diagnosis not present

## 2023-09-09 DIAGNOSIS — H524 Presbyopia: Secondary | ICD-10-CM | POA: Diagnosis not present

## 2024-04-23 ENCOUNTER — Other Ambulatory Visit: Payer: Self-pay | Admitting: Internal Medicine

## 2024-04-23 DIAGNOSIS — Z1231 Encounter for screening mammogram for malignant neoplasm of breast: Secondary | ICD-10-CM

## 2024-05-18 ENCOUNTER — Ambulatory Visit (INDEPENDENT_AMBULATORY_CARE_PROVIDER_SITE_OTHER)

## 2024-05-18 VITALS — Ht 65.5 in | Wt 167.0 lb

## 2024-05-18 DIAGNOSIS — Z0000 Encounter for general adult medical examination without abnormal findings: Secondary | ICD-10-CM

## 2024-05-18 NOTE — Patient Instructions (Signed)
 Kim Anderson , Thank you for taking time out of your busy schedule to complete your Annual Wellness Visit with me. I enjoyed our conversation and look forward to speaking with you again next year. I, as well as your care team,  appreciate your ongoing commitment to your health goals. Please review the following plan we discussed and let me know if I can assist you in the future. Your Game plan/ To Do List    Follow up Visits: Next Medicare AWV with our clinical staff: 05/19/2025.   Have you seen your provider in the last 6 months (3 months if uncontrolled diabetes)? No Next Office Visit with your provider: 08/26/2024.  Clinician Recommendations:  Aim for 30 minutes of exercise or brisk walking, 6-8 glasses of water, and 5 servings of fruits and vegetables each day. Keep up the good work.      This is a list of the screening recommended for you and due dates:  Health Maintenance  Topic Date Due   COVID-19 Vaccine (8 - 2024-25 season) 08/03/2023   Flu Shot  07/02/2024   Cologuard (Stool DNA test)  08/07/2024   Medicare Annual Wellness Visit  05/18/2025   Mammogram  06/05/2025   DTaP/Tdap/Td vaccine (2 - Td or Tdap) 11/05/2028   Pneumococcal Vaccine for age over 61  Completed   DEXA scan (bone density measurement)  Completed   Hepatitis C Screening  Completed   Zoster (Shingles) Vaccine  Completed   HPV Vaccine  Aged Out   Meningitis B Vaccine  Aged Out    Advanced directives: (Copy Requested) Please bring a copy of your health care power of attorney and living will to the office to be added to your chart at your convenience. You can mail to [facility] [address] [place], [place] [address] or email to [email or web address] Advance Care Planning is important because it:  [x]  Makes sure you receive the medical care that is consistent with your values, goals, and preferences  [x]  It provides guidance to your family and loved ones and reduces their decisional burden  about whether or not they are making the right decisions based on your wishes.  Follow the link provided in your after visit summary or read over the paperwork we have mailed to you to help you started getting your Advance Directives in place. If you need assistance in completing these, please reach out to us  so that we can help you!  See attachments for Preventive Care and Fall Prevention Tips.

## 2024-05-18 NOTE — Progress Notes (Signed)
 Subjective:   Kim Anderson is a 73 y.o. who presents for a Medicare Wellness preventive visit.  As a reminder, Annual Wellness Visits don't include a physical exam, and some assessments may be limited, especially if this visit is performed virtually. We may recommend an in-person follow-up visit with your provider if needed.  Visit Complete: Virtual I connected with  Margette Sheldon on 05/18/24 by a audio enabled telemedicine application and verified that I am speaking with the correct person using two identifiers.  Patient Location: Home  Provider Location: Home Office  I discussed the limitations of evaluation and management by telemedicine. The patient expressed understanding and agreed to proceed.  Vital Signs: Because this visit was a virtual/telehealth visit, some criteria may be missing or patient reported. Any vitals not documented were not able to be obtained and vitals that have been documented are patient reported.  VideoDeclined- This patient declined Librarian, academic. Therefore the visit was completed with audio only.  Persons Participating in Visit: Patient.  AWV Questionnaire: No: Patient Medicare AWV questionnaire was not completed prior to this visit.  Cardiac Risk Factors include: advanced age (>94men, >67 women)     Objective:    Today's Vitals   05/18/24 0809  Weight: 167 lb (75.8 kg)  Height: 5' 5.5 (1.664 m)   Body mass index is 27.37 kg/m.     05/18/2024    8:15 AM 12/31/2021   12:20 PM 11/20/2020    2:19 PM 11/05/2019   10:23 AM 07/31/2016    8:51 AM 06/14/2016    3:24 PM 04/13/2015    9:01 AM  Advanced Directives  Does Patient Have a Medical Advance Directive? Yes Yes Yes Yes Yes  No  Yes   Type of Estate agent of [place];Living will Living will;Healthcare Power of [employer] Power of [place];Living will Healthcare Power of [place];Living will     Does patient want to make changes to  medical advance directive?  No - Patient declined No - Patient declined      Copy of Healthcare Power of Attorney in Chart? No - copy requested No - copy requested No - copy requested No - copy requested No - copy requested        Data saved with a previous flowsheet row definition    Current Medications (verified) Outpatient Encounter Medications as of 05/18/2024  Medication Sig   aspirin  (ASPIR-LOW) 81 MG EC tablet Take 1 tablet (81 mg total) by mouth daily. Swallow whole.   B Complex Vitamins (VITAMIN B COMPLEX) TABS TAKE 1 TABLET BY MOUTH DAILY.   Calcium  Carbonate-Vitamin D 600-400 MG-UNIT tablet TAKE 1 TABLET BY MOUTH ONCE DAILY   metoprolol  succinate (TOPROL -XL) 25 MG 24 hr tablet Take 1 tablet (25 mg total) by mouth daily. Annual appt is due w/labs must see provider for future refills   ondansetron  (ZOFRAN -ODT) 4 MG disintegrating tablet Take 1 tablet (4 mg total) by mouth every 8 (eight) hours as needed for nausea or vomiting.   No facility-administered encounter medications on file as of 05/18/2024.    Allergies (verified) Amoxicillin, Ampicillin, Cephalexin, Meperidine hcl, Ofloxacin, and Sulfamethoxazole-trimethoprim   History: Past Medical History:  Diagnosis Date   History of chest pain    Mitral valve prolapse    responded to beta-blockers. no evidence of this on 2-d echo/ happened long ago, has not been an issue for years    Palpitations    Past Surgical History:  Procedure Laterality Date  arm fracture Left 2017   CHOLECYSTECTOMY  1997   COLONOSCOPY  11/02/2007   normal   LEFT SHOULDER ACROMINOPLASTY Left    cleaned it up   Family History  Problem Relation Age of Onset   Diabetes Mother    Cancer Mother    Multiple myeloma Mother    Hemochromatosis Brother    Hemachromatosis Brother    Diabetes Other        parent   Hypertension Other        parent   Breast cancer Neg Hx    Social History   Socioeconomic History   Marital status: Married     Spouse name: Gorman Laughter   Number of children: 4   Years of education: Not on file   Highest education level: Master's degree (e.g., MA, MS, MEng, MEd, MSW, MBA)  Occupational History   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    Employer: [employer]    Comment: Working on Education administrator  Tobacco Use   Smoking status: Never   Smokeless tobacco: Never  Vaping Use   Vaping status: Never Used  Substance and Sexual Activity   Alcohol use: No   Drug use: No   Sexual activity: Not Currently  Other Topics Concern   Not on file  Social History Narrative   Lives with spouse/2025   Social Drivers of Health   Financial Resource Strain: Low Risk  (05/18/2024)   Overall Financial Resource Strain (CARDIA)    Difficulty of Paying Living Expenses: Not hard at all  Food Insecurity: No Food Insecurity (05/18/2024)   Hunger Vital Sign    Worried About Running Out of Food in the Last Year: Never true    Ran Out of Food in the Last Year: Never true  Transportation Needs: No Transportation Needs (05/18/2024)   PRAPARE - Administrator, Civil Service (Medical): No    Lack of Transportation (Non-Medical): No  Physical Activity: Sufficiently Active (05/18/2024)   Exercise Vital Sign    Days of Exercise per Week: 4 days    Minutes of Exercise per Session: 60 min  Stress: No Stress Concern Present (05/18/2024)   [employer] of Occupational Health - Occupational Stress Questionnaire    Feeling of Stress: Not at all  Social Connections: Socially Integrated (05/18/2024)   Social Connection and Isolation Panel    Frequency of Communication with Friends and Family: Three times a week    Frequency of Social Gatherings with Friends and Family: Once a week    Attends Religious Services: More than 4 times per year    Active Member of [employer] or Organizations: Yes    Attends Banker Meetings: 1 to 4 times per year    Marital Status: Married    Tobacco Counseling Counseling given: Not Answered    Clinical  Intake:  Pre-visit preparation completed: Yes  Pain : No/denies pain     BMI - recorded: 27.37 Nutritional Status: BMI 25 -29 Overweight Nutritional Risks: None Diabetes: No  Lab Results  Component Value Date   HGBA1C 5.5 11/05/2018   HGBA1C 5.7 05/01/2017   HGBA1C 5.5 04/10/2015     How often do you need to have someone help you when you read instructions, pamphlets, or other written materials from your doctor or pharmacy?: 1 - Never  Interpreter Needed?: No  Information entered by :: Christi Wirick, RMA   Activities of Daily Living     05/18/2024    8:11 AM  In your present state of health,  do you have any difficulty performing the following activities:  Hearing? 0  Vision? 0  Difficulty concentrating or making decisions? 0  Walking or climbing stairs? 0  Dressing or bathing? 0  Doing errands, shopping? 0  Preparing Food and eating ? N  Using the Toilet? N  In the past six months, have you accidently leaked urine? N  Do you have problems with loss of bowel control? N  Managing your Medications? N  Managing your Finances? N  Housekeeping or managing your Housekeeping? N    Patient Care Team: Adelia Homestead, MD as PCP - General (Internal Medicine) Danise Durie, Craige Dixon, MD (Cardiology) Manya Sells, MD (Neurosurgery) Meriam Stamp, MD as Consulting Physician (Obstetrics and Gynecology)  I have updated your Care Teams any recent Medical Services you may have received from other providers in the past year.     Assessment:   This is a routine wellness examination for Lamiah.  Hearing/Vision screen Hearing Screening - Comments:: Denies hearing difficulties   Vision Screening - Comments:: Wears eyeglasses/Dr. Susana Enter [facility]   Goals Addressed               This Visit's Progress     Patient Stated (pt-stated)   On track     To maintain my current health status by continuing to eat healthy, stay physically active and socially active.        Depression Screen     05/18/2024    8:18 AM 09/04/2023    2:04 PM 08/25/2023    9:06 AM 08/25/2023    9:03 AM 03/06/2023    9:24 AM 12/31/2021   11:58 AM 11/20/2020    2:17 PM  PHQ 2/9 Scores  PHQ - 2 Score 0 0 0 0 0 0 0  PHQ- 9 Score 0  0  0      Fall Risk     05/18/2024    8:16 AM 09/04/2023    2:04 PM 08/25/2023    9:00 AM 03/06/2023    9:26 AM 08/19/2022    9:04 AM  Fall Risk   Falls in the past year? 0 0 0 0 0  Number falls in past yr: 0 0 0 0 0  Injury with Fall? 0 0 0 0 0  Risk for fall due to :  No Fall Risks     Follow up Falls evaluation completed;Falls prevention discussed Falls evaluation completed Falls evaluation completed Falls evaluation completed     MEDICARE RISK AT HOME:  Medicare Risk at Home Any stairs in or around the home?: No If so, are there any without handrails?: No Home free of loose throw rugs in walkways, pet beds, electrical cords, etc?: Yes Adequate lighting in your home to reduce risk of falls?: Yes Life alert?: No Use of a cane, walker or w/c?: No Grab bars in the bathroom?: Yes Shower chair or bench in shower?: Yes Elevated toilet seat or a handicapped toilet?: Yes  TIMED UP AND GO:  Was the test performed?  No  Cognitive Function: Declined/Normal: No cognitive concerns noted by patient or family. Patient alert, oriented, able to answer questions appropriately and recall recent events. No signs of memory loss or confusion.        Immunizations Immunization History  Administered Date(s) Administered   Fluad Quad(high Dose 65+) 08/12/2019, 09/06/2021, 08/19/2022   Influenza Whole 10/18/2010   Influenza, High Dose Seasonal PF 07/28/2019   Influenza,inj,Quad PF,6+ Mos 09/01/2013   Influenza-Unspecified 08/10/2014, 08/18/2017, 08/29/2018   PFIZER(Purple  Top)SARS-COV-2 Vaccination 01/14/2020, 02/06/2020, 10/16/2020, 05/07/2021   PNEUMOCOCCAL CONJUGATE-20 10/31/2021   Pfizer Covid-19 Vaccine Bivalent Booster 75yrs & up 09/06/2021,  05/08/2022, 11/14/2022   Pneumococcal Conjugate-13 11/05/2018   Respiratory Syncytial Virus Vaccine,Recomb Aduvanted(Arexvy) 11/14/2022   Tdap 11/05/2018   Zoster Recombinant(Shingrix) 07/31/2021, 10/31/2021   Zoster, Live 04/10/2015    Screening Tests Health Maintenance  Topic Date Due   COVID-19 Vaccine (8 - 2024-25 season) 08/03/2023   INFLUENZA VACCINE  07/02/2024   Fecal DNA (Cologuard)  08/07/2024   Medicare Annual Wellness (AWV)  05/18/2025   MAMMOGRAM  06/05/2025   DTaP/Tdap/Td (2 - Td or Tdap) 11/05/2028   Pneumococcal Vaccine: 50+ Years  Completed   DEXA SCAN  Completed   Hepatitis C Screening  Completed   Zoster Vaccines- Shingrix  Completed   HPV VACCINES  Aged Out   Meningococcal B Vaccine  Aged Out    Health Maintenance  Health Maintenance Due  Topic Date Due   COVID-19 Vaccine (8 - 2024-25 season) 08/03/2023   Health Maintenance Items Addressed: See Nurse Notes at the end of this note  Additional Screening:  Vision Screening: Recommended annual ophthalmology exams for early detection of glaucoma and other disorders of the eye. Would you like a referral to an eye doctor? No    Dental Screening: Recommended annual dental exams for proper oral hygiene  Community Resource Referral / Chronic Care Management: CRR required this visit?  No   CCM required this visit?  No   Plan:    I have personally reviewed and noted the following in the patient's chart:   Medical and social history Use of alcohol, tobacco or illicit drugs  Current medications and supplements including opioid prescriptions. Patient is not currently taking opioid prescriptions. Functional ability and status Nutritional status Physical activity Advanced directives List of other physicians Hospitalizations, surgeries, and ER visits in previous 12 months Vitals Screenings to include cognitive, depression, and falls Referrals and appointments  In addition, I have reviewed and  discussed with patient certain preventive protocols, quality metrics, and best practice recommendations. A written personalized care plan for preventive services as well as general preventive health recommendations were provided to patient.   Uva Runkel L Mckinzi Eriksen, CMA   05/18/2024   After Visit Summary: (MyChart) Due to this being a telephonic visit, the after visit summary with patients personalized plan was offered to patient via MyChart   Notes: Please refer to Routing Comments.

## 2024-06-07 ENCOUNTER — Ambulatory Visit
Admission: RE | Admit: 2024-06-07 | Discharge: 2024-06-07 | Disposition: A | Discharge status: Home / Self Care | Source: Ambulatory Visit | Attending: Internal Medicine | Admitting: Internal Medicine

## 2024-06-07 DIAGNOSIS — Z1231 Encounter for screening mammogram for malignant neoplasm of breast: Secondary | ICD-10-CM | POA: Diagnosis not present

## 2024-06-14 ENCOUNTER — Ambulatory Visit: Payer: Self-pay | Admitting: Internal Medicine

## 2024-07-26 ENCOUNTER — Telehealth: Admitting: Physician Assistant

## 2024-07-26 DIAGNOSIS — L255 Unspecified contact dermatitis due to plants, except food: Secondary | ICD-10-CM

## 2024-07-26 MED ORDER — PREDNISONE 10 MG (21) PO TBPK: ORAL_TABLET | ORAL | 0 refills | Status: DC

## 2024-07-26 NOTE — Progress Notes (Signed)
 E-Visit for [employer]  We are sorry that you are not feeing well.  Here is how we plan to help!  Based on what you have shared with me it looks like you have had an allergic reaction to the oily resin from a group of plants.  This resin is very sticky, so it easily attaches to your skin, clothing, tools equipment, and pet's fur.    This blistering rash is often called poison ivy rash although it can come from contact with the leaves, stems and roots of poison ivy, poison oak and poison sumac.  The oily resin contains urushiol (u-ROO-she-ol) that produces a skin rash on exposed skin.  The severity of the rash depends on the amount of urushiol that gets on your skin.  A section of skin with more urushiol on it may develop a rash sooner.  The rash usually develops 12-48 hours after exposure and can last two to three weeks.  Your skin must come in direct contact with the plant's oil to be affected.  Blister fluid doesn't spread the rash.  However, if you come into contact with a piece of clothing or pet fur that has urushiol on it, the rash may spread out.  You can also transfer the oil to other parts of your body with your fingers.  Often the rash looks like a straight line because of the way the plant brushes against your skin.  Since your rash is widespread or has resulted in a large number of blisters, I have prescribed an oral corticosteroid.  Please follow these recommendations:  I have sent a prednisone dose pack to your chosen pharmacy. Be sure to follow the instructions carefully and complete the entire prescription. You may use Benadryl or Caladryl topical lotions to sooth the itch and remember cool, not hot, showers and baths can help relieve the itching!  Place cool, wet compresses on the affected area for 15-30 minutes several times a day.  You may also take oral antihistamines, such as diphenhydramine (Benadryl, others), which may also help you sleep better.  Watch your skin for any purulent  (pus) drainage or red streaking from the site.  If this occurs, contact your provider.  You may require an antibiotic for a skin infection.  Make sure that the clothes you were wearing as well as any towels or sheets that may have come in contact with the oil (urushiol) are washed in detergent and hot water.       I have developed the following plan to treat your condition: I am prescribing a 6 day prednisone taper.  Day 1: Take 2 tablets with breakfast, 1 tablet with lunch, 1 tablet with supper, and 2 tablets at bedtime Day 2: Take 2 tablets with breakfast, 1 tablet with lunch, 1 tablet with supper, and 1 tablet at bedtime Day 3: Take 1 tablet at breakfast, 1 tablet with lunch, 1 tablet with supper, and 1 tablet at bedtime Day 4: Take 1 tablet with breakfast, 1 tablet with supper and 1 tablet at bedtime Day 5: Take 1 tablet with breakfast and 1 tablet with supper or bedtime Day 6: Take 1 tablet with breakfast    What can you do to prevent this rash?  Avoid the plants.  Learn how to identify poison ivy, poison oak and poison sumac in all seasons.  When hiking or engaging in other activities that might expose you to these plants, try to stay on cleared pathways.  If camping, make sure you  pitch your tent in an area free of these plants.  Keep pets from running through wooded areas so that urushiol doesn't accidentally stick to their fur, which you may touch.  Remove or kill the plants.  In your yard, you can get rid of poison ivy by applying an herbicide or pulling it out of the ground, including the roots, while wearing heavy gloves.  Afterward remove the gloves and thoroughly wash them and your hands.  Don't burn poison ivy or related plants because the urushiol can be carried by smoke.  Wear protective clothing.  If needed, protect your skin by wearing socks, boots, pants, long sleeves and vinyl gloves.  Wash your skin right away.  Washing off the oil with soap and water within 30 minutes of  exposure may reduce your chances of getting a poison ivy rash.  Even washing after an hour or so can help reduce the severity of the rash.  If you walk through some poison ivy and then later touch your shoes, you may get some urushiol on your hands, which may then transfer to your face or body by touching or rubbing.  If the contaminated object isn't cleaned, the urushiol on it can still cause a skin reaction years later.    Be careful not to reuse towels after you have washed your skin.  Also carefully wash clothing in detergent and hot water to remove all traces of the oil.  Handle contaminated clothing carefully so you don't transfer the urushiol to yourself, furniture, rugs or appliances.  Remember that pets can carry the oil on their fur and paws.  If you think your pet may be contaminated with urushiol, put on some long rubber gloves and give your pet a bath.  Finally, be careful not to burn these plants as the smoke can contain traces of the oil.  Inhaling the smoke may result in difficulty breathing. If that occurred you should see a physician as soon as possible.  See your doctor right away if:  The reaction is severe or widespread You inhaled the smoke from burning poison ivy and are having difficulty breathing Your skin continues to swell The rash affects your eyes, mouth or genitals Blisters are oozing pus You develop a fever greater than 100 F (37.8 C) The rash doesn't get better within a few weeks.  If you scratch the poison ivy rash, bacteria under your fingernails may cause the skin to become infected.  See your doctor if pus starts oozing from the blisters.  Treatment generally includes antibiotics.  Poison ivy treatments are usually limited to self-care methods.  And the rash typically goes away on its own in two to three weeks.     If the rash is widespread or results in a large number of blisters, your doctor may prescribe an oral corticosteroid, such as prednisone.  If  a bacterial infection has developed at the rash site, your doctor may give you a prescription for an oral antibiotic.  MAKE SURE YOU  Understand these instructions. Will watch your condition. Will get help right away if you are not doing well or get worse.   Thank you for choosing an e-visit.  Your e-visit answers were reviewed by a board certified advanced clinical practitioner to complete your personal care plan. Depending upon the condition, your plan could have included both over the counter or prescription medications.  Please review your pharmacy choice. Make sure the pharmacy is open so you can pick up prescription now.  If there is a problem, you may contact your provider through [employer] and have the prescription routed to another pharmacy.  Your safety is important to us . If you have drug allergies check your prescription carefully.   For the next 24 hours you can use MyChart to ask questions about today's visit, request a non-urgent call back, or ask for a work or school excuse. You will get an email in the next two days asking about your experience. I hope that your e-visit has been valuable and will speed your recovery.     I have spent 5 minutes in review of e-visit questionnaire, review and updating patient chart, medical decision making and response to patient.   Delon CHRISTELLA Dickinson, PA-C

## 2024-08-16 DIAGNOSIS — H04123 Dry eye syndrome of bilateral lacrimal glands: Secondary | ICD-10-CM | POA: Diagnosis not present

## 2024-08-16 DIAGNOSIS — H26493 Other secondary cataract, bilateral: Secondary | ICD-10-CM | POA: Diagnosis not present

## 2024-08-16 DIAGNOSIS — Z961 Presence of intraocular lens: Secondary | ICD-10-CM | POA: Diagnosis not present

## 2024-08-16 DIAGNOSIS — H43813 Vitreous degeneration, bilateral: Secondary | ICD-10-CM | POA: Diagnosis not present

## 2024-08-16 DIAGNOSIS — H35362 Drusen (degenerative) of macula, left eye: Secondary | ICD-10-CM | POA: Diagnosis not present

## 2024-08-20 ENCOUNTER — Other Ambulatory Visit: Payer: Self-pay | Admitting: Internal Medicine

## 2024-08-25 DIAGNOSIS — H524 Presbyopia: Secondary | ICD-10-CM | POA: Diagnosis not present

## 2024-08-25 DIAGNOSIS — H52223 Regular astigmatism, bilateral: Secondary | ICD-10-CM | POA: Diagnosis not present

## 2024-08-26 ENCOUNTER — Encounter: Payer: Medicare HMO | Admitting: Internal Medicine

## 2024-08-30 ENCOUNTER — Encounter: Payer: Self-pay | Admitting: Internal Medicine

## 2024-09-01 ENCOUNTER — Other Ambulatory Visit: Payer: Self-pay

## 2024-09-01 MED ORDER — METOPROLOL SUCCINATE ER 25 MG PO TB24: 25.0000 mg | ORAL_TABLET | Freq: Every day | ORAL | 0 refills | Status: DC

## 2024-09-01 NOTE — Telephone Encounter (Signed)
 Are we able to do this?

## 2024-09-10 ENCOUNTER — Encounter: Admitting: Internal Medicine

## 2024-09-13 ENCOUNTER — Encounter: Payer: Self-pay | Admitting: Internal Medicine

## 2024-09-13 ENCOUNTER — Ambulatory Visit

## 2024-09-13 ENCOUNTER — Other Ambulatory Visit: Payer: Self-pay | Admitting: Internal Medicine

## 2024-09-13 ENCOUNTER — Ambulatory Visit: Admitting: Internal Medicine

## 2024-09-13 VITALS — BP 122/84 | HR 62 | Temp 97.6°F | Ht 65.5 in | Wt 164.0 lb

## 2024-09-13 DIAGNOSIS — Z23 Encounter for immunization: Secondary | ICD-10-CM | POA: Diagnosis not present

## 2024-09-13 DIAGNOSIS — Z0000 Encounter for general adult medical examination without abnormal findings: Secondary | ICD-10-CM | POA: Diagnosis not present

## 2024-09-13 DIAGNOSIS — R002 Palpitations: Secondary | ICD-10-CM

## 2024-09-13 DIAGNOSIS — R7309 Other abnormal glucose: Secondary | ICD-10-CM

## 2024-09-13 DIAGNOSIS — Z1211 Encounter for screening for malignant neoplasm of colon: Secondary | ICD-10-CM

## 2024-09-13 LAB — CBC
HCT: 42 % (ref 36.0–46.0)
Hemoglobin: 14.2 g/dL (ref 12.0–15.0)
MCHC: 33.9 g/dL (ref 30.0–36.0)
MCV: 91.3 fl (ref 78.0–100.0)
Platelets: 225 K/uL (ref 150.0–400.0)
RBC: 4.59 Mil/uL (ref 3.87–5.11)
RDW: 13 % (ref 11.5–15.5)
WBC: 5.5 K/uL (ref 4.0–10.5)

## 2024-09-13 LAB — COMPREHENSIVE METABOLIC PANEL WITH GFR
ALT: 10 U/L (ref 0–35)
AST: 16 U/L (ref 0–37)
Albumin: 4.5 g/dL (ref 3.5–5.2)
Alkaline Phosphatase: 64 U/L (ref 39–117)
BUN: 12 mg/dL (ref 6–23)
CO2: 27 meq/L (ref 19–32)
Calcium: 9.1 mg/dL (ref 8.4–10.5)
Chloride: 103 meq/L (ref 96–112)
Creatinine, Ser: 0.81 mg/dL (ref 0.40–1.20)
GFR: 73.03 mL/min (ref >60.00)
Glucose, Bld: 112 mg/dL — ABNORMAL HIGH (ref 70–99)
Potassium: 4.8 meq/L (ref 3.5–5.1)
Sodium: 138 meq/L (ref 135–145)
Total Bilirubin: 0.9 mg/dL (ref 0.2–1.2)
Total Protein: 7 g/dL (ref 6.0–8.3)

## 2024-09-13 LAB — HEMOGLOBIN A1C: Hgb A1c MFr Bld: 5.8 % (ref 4.6–6.5)

## 2024-09-13 LAB — LIPID PANEL
Cholesterol: 207 mg/dL — ABNORMAL HIGH (ref 0–200)
HDL: 78.9 mg/dL (ref >39.00)
LDL Cholesterol: 115 mg/dL — ABNORMAL HIGH (ref 0–99)
NonHDL: 128.53
Total CHOL/HDL Ratio: 3
Triglycerides: 70 mg/dL (ref 0.0–149.0)
VLDL: 14 mg/dL (ref 0.0–40.0)

## 2024-09-13 NOTE — Assessment & Plan Note (Signed)
 Flu shot given. Pneumonia complete. Shingrix complete. Tetanus up to date. Cologuard ordered. Mammogram up to date, pap smear aged out and dexa due 2026. Counseled about sun safety and mole surveillance. Counseled about the dangers of distracted driving. Given 10 year screening recommendations.

## 2024-09-13 NOTE — Assessment & Plan Note (Signed)
 Takes metoprolol  25 mg daily and no recurrence. Will continue.

## 2024-09-13 NOTE — Progress Notes (Signed)
   Subjective:   Patient ID: SNOW PEOPLES, female    DOB: 11/16/1951, 73 y.o.   MRN: [id number]  The patient is here for physical. Pertinent topics discussed: Discussed the use of AI scribe software for clinical note transcription with the patient, who gave verbal consent to proceed.  History of Present Illness SUMAIYAH MARKERT is a 73 year old female who presents for a routine follow-up visit.  She has been in good health over the past year with no major changes in her condition. No new chest pains, tightness, heart racing, breathing troubles, chronic cough, or changes in smoking habits. She drinks alcohol but does not smoke.  Her right hip occasionally flares up, particularly on days without exercise. Walking daily helps manage the symptoms, and she walks ten to twelve miles a week to stay active.  She has varicose veins, which have not been problematic for over twenty years, except recently after a long road trip to [place] . The veins became sore but were not swollen, hot, or red. The soreness is more noticeable when sitting down.  PMH, [facility], social history reviewed and updated  Review of Systems  Constitutional: Negative.   HENT: Negative.    Eyes: Negative.   Respiratory:  Negative for cough, chest tightness and shortness of breath.   Cardiovascular:  Negative for chest pain, palpitations and leg swelling.  Gastrointestinal:  Negative for abdominal distention, abdominal pain, constipation, diarrhea, nausea and vomiting.  Musculoskeletal: Negative.   Skin: Negative.   Neurological: Negative.   Psychiatric/Behavioral: Negative.      Objective:  Physical Exam Constitutional:      Appearance: She is well-developed.  HENT:     Head: Normocephalic and atraumatic.  Cardiovascular:     Rate and Rhythm: Normal rate and regular rhythm.  Pulmonary:     Effort: Pulmonary effort is normal. No respiratory distress.     Breath sounds: Normal breath sounds. No wheezing or rales.  Abdominal:      General: Bowel sounds are normal. There is no distension.     Palpations: Abdomen is soft.     Tenderness: There is no abdominal tenderness.  Musculoskeletal:     Cervical back: Normal range of motion.  Skin:    General: Skin is warm and dry.  Neurological:     Mental Status: She is alert and oriented to person, place, and time.     Coordination: Coordination normal.     Vitals:   09/13/24 0829  BP: 122/84  Pulse: 62  Temp: 97.6 F (36.4 C)  TempSrc: Temporal  SpO2: 100%  Weight: 164 lb (74.4 kg)  Height: 5' 5.5 (1.664 m)    Assessment & Plan:

## 2024-09-15 ENCOUNTER — Ambulatory Visit: Payer: Self-pay | Admitting: Internal Medicine

## 2024-09-20 DIAGNOSIS — Z1211 Encounter for screening for malignant neoplasm of colon: Secondary | ICD-10-CM | POA: Diagnosis not present

## 2024-09-26 LAB — COLOGUARD: COLOGUARD: NEGATIVE

## 2024-09-29 LAB — COLOGUARD: Cologuard: NEGATIVE

## 2024-11-14 ENCOUNTER — Other Ambulatory Visit: Payer: Self-pay | Admitting: Internal Medicine

## 2025-05-19 ENCOUNTER — Ambulatory Visit

## 2025-09-14 ENCOUNTER — Encounter: Admitting: Internal Medicine
